# Patient Record
Sex: Male | Born: 1980 | Race: White | Hispanic: No | Marital: Single | Smoking: Current every day smoker
Health system: Southern US, Community
[De-identification: ages and names within clinical notes are randomized; demographics above are authoritative.]

## PROBLEM LIST (undated history)

## (undated) DIAGNOSIS — F1910 Other psychoactive substance abuse, uncomplicated: Secondary | ICD-10-CM

## (undated) DIAGNOSIS — J449 Chronic obstructive pulmonary disease, unspecified: Secondary | ICD-10-CM

## (undated) DIAGNOSIS — F419 Anxiety disorder, unspecified: Secondary | ICD-10-CM

## (undated) DIAGNOSIS — F32A Depression, unspecified: Secondary | ICD-10-CM

## (undated) DIAGNOSIS — F319 Bipolar disorder, unspecified: Secondary | ICD-10-CM

## (undated) DIAGNOSIS — N200 Calculus of kidney: Secondary | ICD-10-CM

## (undated) DIAGNOSIS — E8801 Alpha-1-antitrypsin deficiency: Secondary | ICD-10-CM

## (undated) DIAGNOSIS — K5090 Crohn's disease, unspecified, without complications: Secondary | ICD-10-CM

## (undated) DIAGNOSIS — F329 Major depressive disorder, single episode, unspecified: Secondary | ICD-10-CM

## (undated) HISTORY — PX: INGUINAL HERNIA REPAIR: SUR1180

## (undated) HISTORY — PX: TONSILLECTOMY: SUR1361

---

## 1999-04-19 ENCOUNTER — Emergency Department (HOSPITAL_COMMUNITY): Admission: EM | Admit: 1999-04-19 | Discharge: 1999-04-19 | Payer: Self-pay

## 1999-04-20 ENCOUNTER — Emergency Department (HOSPITAL_COMMUNITY): Admission: EM | Admit: 1999-04-20 | Discharge: 1999-04-20 | Payer: Self-pay | Admitting: Emergency Medicine

## 1999-12-29 ENCOUNTER — Encounter: Payer: Self-pay | Admitting: Emergency Medicine

## 1999-12-29 ENCOUNTER — Emergency Department (HOSPITAL_COMMUNITY): Admission: EM | Admit: 1999-12-29 | Discharge: 1999-12-29 | Payer: Self-pay | Admitting: Emergency Medicine

## 2000-02-14 ENCOUNTER — Encounter: Payer: Self-pay | Admitting: Orthopedic Surgery

## 2000-02-14 ENCOUNTER — Ambulatory Visit (HOSPITAL_COMMUNITY): Admission: RE | Admit: 2000-02-14 | Discharge: 2000-02-14 | Payer: Self-pay | Admitting: Orthopedic Surgery

## 2001-06-12 ENCOUNTER — Emergency Department (HOSPITAL_COMMUNITY): Admission: EM | Admit: 2001-06-12 | Discharge: 2001-06-13 | Payer: Self-pay | Admitting: Emergency Medicine

## 2001-11-19 ENCOUNTER — Emergency Department (HOSPITAL_COMMUNITY): Admission: EM | Admit: 2001-11-19 | Discharge: 2001-11-20 | Payer: Self-pay | Admitting: Emergency Medicine

## 2001-11-20 ENCOUNTER — Encounter: Payer: Self-pay | Admitting: Emergency Medicine

## 2002-02-25 ENCOUNTER — Encounter: Payer: Self-pay | Admitting: Internal Medicine

## 2002-02-25 ENCOUNTER — Ambulatory Visit (HOSPITAL_COMMUNITY): Admission: RE | Admit: 2002-02-25 | Discharge: 2002-02-25 | Payer: Self-pay | Admitting: Internal Medicine

## 2002-03-14 ENCOUNTER — Encounter: Payer: Self-pay | Admitting: Internal Medicine

## 2002-03-14 ENCOUNTER — Encounter (INDEPENDENT_AMBULATORY_CARE_PROVIDER_SITE_OTHER): Payer: Self-pay | Admitting: *Deleted

## 2002-03-14 ENCOUNTER — Ambulatory Visit (HOSPITAL_COMMUNITY): Admission: RE | Admit: 2002-03-14 | Discharge: 2002-03-14 | Payer: Self-pay | Admitting: Internal Medicine

## 2002-11-11 ENCOUNTER — Emergency Department (HOSPITAL_COMMUNITY): Admission: EM | Admit: 2002-11-11 | Discharge: 2002-11-11 | Payer: Self-pay | Admitting: Emergency Medicine

## 2002-11-15 ENCOUNTER — Emergency Department (HOSPITAL_COMMUNITY): Admission: EM | Admit: 2002-11-15 | Discharge: 2002-11-15 | Payer: Self-pay | Admitting: Emergency Medicine

## 2002-12-27 ENCOUNTER — Emergency Department (HOSPITAL_COMMUNITY): Admission: AD | Admit: 2002-12-27 | Discharge: 2002-12-27 | Payer: Self-pay | Admitting: Emergency Medicine

## 2002-12-27 ENCOUNTER — Encounter: Payer: Self-pay | Admitting: Emergency Medicine

## 2003-01-06 ENCOUNTER — Emergency Department (HOSPITAL_COMMUNITY): Admission: EM | Admit: 2003-01-06 | Discharge: 2003-01-06 | Payer: Self-pay | Admitting: Emergency Medicine

## 2003-02-17 ENCOUNTER — Inpatient Hospital Stay (HOSPITAL_COMMUNITY): Admission: EM | Admit: 2003-02-17 | Discharge: 2003-02-24 | Payer: Self-pay | Admitting: Emergency Medicine

## 2003-02-17 ENCOUNTER — Encounter: Payer: Self-pay | Admitting: Emergency Medicine

## 2003-02-18 ENCOUNTER — Encounter: Payer: Self-pay | Admitting: Internal Medicine

## 2003-02-18 ENCOUNTER — Encounter (INDEPENDENT_AMBULATORY_CARE_PROVIDER_SITE_OTHER): Payer: Self-pay | Admitting: *Deleted

## 2003-02-23 ENCOUNTER — Encounter: Payer: Self-pay | Admitting: Internal Medicine

## 2003-02-23 ENCOUNTER — Encounter (INDEPENDENT_AMBULATORY_CARE_PROVIDER_SITE_OTHER): Payer: Self-pay | Admitting: *Deleted

## 2003-03-11 ENCOUNTER — Emergency Department (HOSPITAL_COMMUNITY): Admission: EM | Admit: 2003-03-11 | Discharge: 2003-03-12 | Payer: Self-pay | Admitting: Emergency Medicine

## 2003-03-11 ENCOUNTER — Encounter: Payer: Self-pay | Admitting: Emergency Medicine

## 2003-03-13 ENCOUNTER — Emergency Department (HOSPITAL_COMMUNITY): Admission: AD | Admit: 2003-03-13 | Discharge: 2003-03-14 | Payer: Self-pay | Admitting: Emergency Medicine

## 2003-03-14 ENCOUNTER — Encounter: Payer: Self-pay | Admitting: Emergency Medicine

## 2003-05-04 ENCOUNTER — Emergency Department (HOSPITAL_COMMUNITY): Admission: EM | Admit: 2003-05-04 | Discharge: 2003-05-05 | Payer: Self-pay | Admitting: Emergency Medicine

## 2003-05-07 ENCOUNTER — Inpatient Hospital Stay (HOSPITAL_COMMUNITY): Admission: AD | Admit: 2003-05-07 | Discharge: 2003-05-10 | Payer: Self-pay | Admitting: Family Medicine

## 2003-05-07 ENCOUNTER — Encounter: Payer: Self-pay | Admitting: Emergency Medicine

## 2003-07-03 ENCOUNTER — Emergency Department (HOSPITAL_COMMUNITY): Admission: EM | Admit: 2003-07-03 | Discharge: 2003-07-03 | Payer: Self-pay | Admitting: *Deleted

## 2003-07-10 ENCOUNTER — Encounter: Admission: RE | Admit: 2003-07-10 | Discharge: 2003-07-10 | Payer: Self-pay | Admitting: Internal Medicine

## 2003-07-31 ENCOUNTER — Encounter: Admission: RE | Admit: 2003-07-31 | Discharge: 2003-07-31 | Payer: Self-pay | Admitting: Internal Medicine

## 2003-10-17 ENCOUNTER — Emergency Department (HOSPITAL_COMMUNITY): Admission: EM | Admit: 2003-10-17 | Discharge: 2003-10-17 | Payer: Self-pay | Admitting: Emergency Medicine

## 2003-10-26 ENCOUNTER — Emergency Department (HOSPITAL_COMMUNITY): Admission: EM | Admit: 2003-10-26 | Discharge: 2003-10-26 | Payer: Self-pay

## 2004-01-17 ENCOUNTER — Emergency Department (HOSPITAL_COMMUNITY): Admission: EM | Admit: 2004-01-17 | Discharge: 2004-01-17 | Payer: Self-pay | Admitting: Emergency Medicine

## 2004-01-18 ENCOUNTER — Emergency Department (HOSPITAL_COMMUNITY): Admission: EM | Admit: 2004-01-18 | Discharge: 2004-01-18 | Payer: Self-pay | Admitting: Emergency Medicine

## 2004-01-30 ENCOUNTER — Emergency Department (HOSPITAL_COMMUNITY): Admission: EM | Admit: 2004-01-30 | Discharge: 2004-01-30 | Payer: Self-pay | Admitting: Emergency Medicine

## 2004-02-03 ENCOUNTER — Emergency Department (HOSPITAL_COMMUNITY): Admission: EM | Admit: 2004-02-03 | Discharge: 2004-02-03 | Payer: Self-pay | Admitting: Emergency Medicine

## 2004-02-10 ENCOUNTER — Emergency Department (HOSPITAL_COMMUNITY): Admission: EM | Admit: 2004-02-10 | Discharge: 2004-02-10 | Payer: Self-pay | Admitting: Internal Medicine

## 2004-02-15 ENCOUNTER — Emergency Department (HOSPITAL_COMMUNITY): Admission: EM | Admit: 2004-02-15 | Discharge: 2004-02-15 | Payer: Self-pay | Admitting: Emergency Medicine

## 2004-02-21 ENCOUNTER — Emergency Department (HOSPITAL_COMMUNITY): Admission: EM | Admit: 2004-02-21 | Discharge: 2004-02-22 | Payer: Self-pay | Admitting: Emergency Medicine

## 2004-02-22 ENCOUNTER — Emergency Department (HOSPITAL_COMMUNITY): Admission: EM | Admit: 2004-02-22 | Discharge: 2004-02-22 | Payer: Self-pay | Admitting: Emergency Medicine

## 2004-02-25 ENCOUNTER — Encounter: Admission: RE | Admit: 2004-02-25 | Discharge: 2004-02-25 | Payer: Self-pay | Admitting: Internal Medicine

## 2004-03-30 ENCOUNTER — Emergency Department (HOSPITAL_COMMUNITY): Admission: EM | Admit: 2004-03-30 | Discharge: 2004-03-31 | Payer: Self-pay | Admitting: Emergency Medicine

## 2004-04-02 ENCOUNTER — Inpatient Hospital Stay (HOSPITAL_COMMUNITY): Admission: RE | Admit: 2004-04-02 | Discharge: 2004-04-06 | Payer: Self-pay | Admitting: Internal Medicine

## 2004-04-06 ENCOUNTER — Encounter (INDEPENDENT_AMBULATORY_CARE_PROVIDER_SITE_OTHER): Payer: Self-pay | Admitting: *Deleted

## 2004-05-26 ENCOUNTER — Ambulatory Visit: Payer: Self-pay | Admitting: Internal Medicine

## 2004-07-03 ENCOUNTER — Emergency Department (HOSPITAL_COMMUNITY): Admission: EM | Admit: 2004-07-03 | Discharge: 2004-07-03 | Payer: Self-pay

## 2004-07-03 ENCOUNTER — Encounter (INDEPENDENT_AMBULATORY_CARE_PROVIDER_SITE_OTHER): Payer: Self-pay | Admitting: *Deleted

## 2004-09-14 ENCOUNTER — Emergency Department (HOSPITAL_COMMUNITY): Admission: EM | Admit: 2004-09-14 | Discharge: 2004-09-14 | Payer: Self-pay | Admitting: Emergency Medicine

## 2005-01-02 ENCOUNTER — Emergency Department (HOSPITAL_COMMUNITY): Admission: EM | Admit: 2005-01-02 | Discharge: 2005-01-03 | Payer: Self-pay | Admitting: Emergency Medicine

## 2005-01-15 ENCOUNTER — Emergency Department (HOSPITAL_COMMUNITY): Admission: EM | Admit: 2005-01-15 | Discharge: 2005-01-15 | Payer: Self-pay | Admitting: Emergency Medicine

## 2005-01-21 ENCOUNTER — Emergency Department (HOSPITAL_COMMUNITY): Admission: EM | Admit: 2005-01-21 | Discharge: 2005-01-21 | Payer: Self-pay | Admitting: Emergency Medicine

## 2005-04-14 ENCOUNTER — Emergency Department (HOSPITAL_COMMUNITY): Admission: EM | Admit: 2005-04-14 | Discharge: 2005-04-14 | Payer: Self-pay | Admitting: Emergency Medicine

## 2005-09-24 ENCOUNTER — Emergency Department (HOSPITAL_COMMUNITY): Admission: EM | Admit: 2005-09-24 | Discharge: 2005-09-24 | Payer: Self-pay | Admitting: Emergency Medicine

## 2005-10-02 ENCOUNTER — Encounter (INDEPENDENT_AMBULATORY_CARE_PROVIDER_SITE_OTHER): Payer: Self-pay | Admitting: *Deleted

## 2005-10-02 ENCOUNTER — Emergency Department (HOSPITAL_COMMUNITY): Admission: EM | Admit: 2005-10-02 | Discharge: 2005-10-02 | Payer: Self-pay | Admitting: *Deleted

## 2005-11-21 ENCOUNTER — Emergency Department (HOSPITAL_COMMUNITY): Admission: EM | Admit: 2005-11-21 | Discharge: 2005-11-21 | Payer: Self-pay | Admitting: Emergency Medicine

## 2006-01-05 ENCOUNTER — Inpatient Hospital Stay (HOSPITAL_COMMUNITY): Admission: EM | Admit: 2006-01-05 | Discharge: 2006-01-18 | Payer: Self-pay | Admitting: Emergency Medicine

## 2006-01-05 ENCOUNTER — Encounter (INDEPENDENT_AMBULATORY_CARE_PROVIDER_SITE_OTHER): Payer: Self-pay | Admitting: *Deleted

## 2006-01-06 ENCOUNTER — Encounter (INDEPENDENT_AMBULATORY_CARE_PROVIDER_SITE_OTHER): Payer: Self-pay | Admitting: *Deleted

## 2006-01-08 ENCOUNTER — Encounter (INDEPENDENT_AMBULATORY_CARE_PROVIDER_SITE_OTHER): Payer: Self-pay | Admitting: *Deleted

## 2006-01-09 ENCOUNTER — Ambulatory Visit: Payer: Self-pay | Admitting: Gastroenterology

## 2006-01-12 ENCOUNTER — Encounter (INDEPENDENT_AMBULATORY_CARE_PROVIDER_SITE_OTHER): Payer: Self-pay | Admitting: *Deleted

## 2006-01-12 ENCOUNTER — Encounter (INDEPENDENT_AMBULATORY_CARE_PROVIDER_SITE_OTHER): Payer: Self-pay | Admitting: Specialist

## 2006-01-17 HISTORY — PX: COLON SURGERY: SHX602

## 2006-01-18 ENCOUNTER — Encounter (INDEPENDENT_AMBULATORY_CARE_PROVIDER_SITE_OTHER): Payer: Self-pay | Admitting: *Deleted

## 2006-02-16 ENCOUNTER — Ambulatory Visit: Payer: Self-pay | Admitting: Internal Medicine

## 2008-07-19 ENCOUNTER — Emergency Department (HOSPITAL_COMMUNITY): Admission: EM | Admit: 2008-07-19 | Discharge: 2008-07-20 | Payer: Self-pay | Admitting: Family Medicine

## 2008-07-19 ENCOUNTER — Encounter (INDEPENDENT_AMBULATORY_CARE_PROVIDER_SITE_OTHER): Payer: Self-pay | Admitting: *Deleted

## 2009-06-29 ENCOUNTER — Encounter (INDEPENDENT_AMBULATORY_CARE_PROVIDER_SITE_OTHER): Payer: Self-pay | Admitting: *Deleted

## 2009-06-29 ENCOUNTER — Emergency Department (HOSPITAL_COMMUNITY): Admission: EM | Admit: 2009-06-29 | Discharge: 2009-06-29 | Payer: Self-pay | Admitting: Emergency Medicine

## 2009-07-08 ENCOUNTER — Emergency Department (HOSPITAL_COMMUNITY): Admission: EM | Admit: 2009-07-08 | Discharge: 2009-07-08 | Payer: Self-pay | Admitting: Emergency Medicine

## 2009-07-15 ENCOUNTER — Emergency Department (HOSPITAL_COMMUNITY): Admission: EM | Admit: 2009-07-15 | Discharge: 2009-07-15 | Payer: Self-pay | Admitting: Emergency Medicine

## 2009-07-22 ENCOUNTER — Ambulatory Visit (HOSPITAL_COMMUNITY): Admission: RE | Admit: 2009-07-22 | Discharge: 2009-07-22 | Payer: Self-pay | Admitting: Urology

## 2009-07-25 ENCOUNTER — Emergency Department (HOSPITAL_COMMUNITY): Admission: EM | Admit: 2009-07-25 | Discharge: 2009-07-26 | Payer: Self-pay | Admitting: Emergency Medicine

## 2009-07-26 ENCOUNTER — Inpatient Hospital Stay (HOSPITAL_COMMUNITY): Admission: AD | Admit: 2009-07-26 | Discharge: 2009-07-31 | Payer: Self-pay | Admitting: Psychiatry

## 2009-07-26 ENCOUNTER — Ambulatory Visit: Payer: Self-pay | Admitting: Psychiatry

## 2009-07-27 ENCOUNTER — Encounter (INDEPENDENT_AMBULATORY_CARE_PROVIDER_SITE_OTHER): Payer: Self-pay | Admitting: *Deleted

## 2009-08-11 ENCOUNTER — Emergency Department (HOSPITAL_COMMUNITY): Admission: EM | Admit: 2009-08-11 | Discharge: 2009-08-11 | Payer: Self-pay | Admitting: Emergency Medicine

## 2009-08-11 ENCOUNTER — Telehealth: Payer: Self-pay | Admitting: Internal Medicine

## 2009-08-11 ENCOUNTER — Ambulatory Visit (HOSPITAL_COMMUNITY): Admission: RE | Admit: 2009-08-11 | Discharge: 2009-08-11 | Payer: Self-pay | Admitting: Urology

## 2009-08-11 ENCOUNTER — Encounter (INDEPENDENT_AMBULATORY_CARE_PROVIDER_SITE_OTHER): Payer: Self-pay | Admitting: *Deleted

## 2009-08-16 ENCOUNTER — Encounter (INDEPENDENT_AMBULATORY_CARE_PROVIDER_SITE_OTHER): Payer: Self-pay | Admitting: *Deleted

## 2009-09-17 ENCOUNTER — Encounter (INDEPENDENT_AMBULATORY_CARE_PROVIDER_SITE_OTHER): Payer: Self-pay | Admitting: *Deleted

## 2009-09-17 ENCOUNTER — Emergency Department (HOSPITAL_COMMUNITY): Admission: EM | Admit: 2009-09-17 | Discharge: 2009-09-17 | Payer: Self-pay | Admitting: Emergency Medicine

## 2009-09-20 DIAGNOSIS — K5090 Crohn's disease, unspecified, without complications: Secondary | ICD-10-CM

## 2009-09-20 DIAGNOSIS — Z8719 Personal history of other diseases of the digestive system: Secondary | ICD-10-CM | POA: Insufficient documentation

## 2009-09-20 DIAGNOSIS — F909 Attention-deficit hyperactivity disorder, unspecified type: Secondary | ICD-10-CM | POA: Insufficient documentation

## 2009-09-20 DIAGNOSIS — Z9189 Other specified personal risk factors, not elsewhere classified: Secondary | ICD-10-CM | POA: Insufficient documentation

## 2009-09-20 DIAGNOSIS — D649 Anemia, unspecified: Secondary | ICD-10-CM

## 2009-09-20 DIAGNOSIS — F319 Bipolar disorder, unspecified: Secondary | ICD-10-CM

## 2009-10-21 ENCOUNTER — Emergency Department (HOSPITAL_COMMUNITY): Admission: EM | Admit: 2009-10-21 | Discharge: 2009-10-21 | Payer: Self-pay | Admitting: Emergency Medicine

## 2009-11-27 ENCOUNTER — Emergency Department (HOSPITAL_COMMUNITY): Admission: EM | Admit: 2009-11-27 | Discharge: 2009-11-27 | Payer: Self-pay | Admitting: Emergency Medicine

## 2010-04-11 ENCOUNTER — Emergency Department (HOSPITAL_COMMUNITY): Admission: EM | Admit: 2010-04-11 | Discharge: 2010-04-12 | Payer: Self-pay | Admitting: Emergency Medicine

## 2010-08-10 IMAGING — CR DG ABDOMEN 1V
1 series · 1 of 1 positions shown · non-contrast
Comparison: 07/15/2009.

CLINICAL DATA: Pre lithotripsy for left UPJ stone.

ABDOMEN - 1 VIEW

[t abdomen supine]
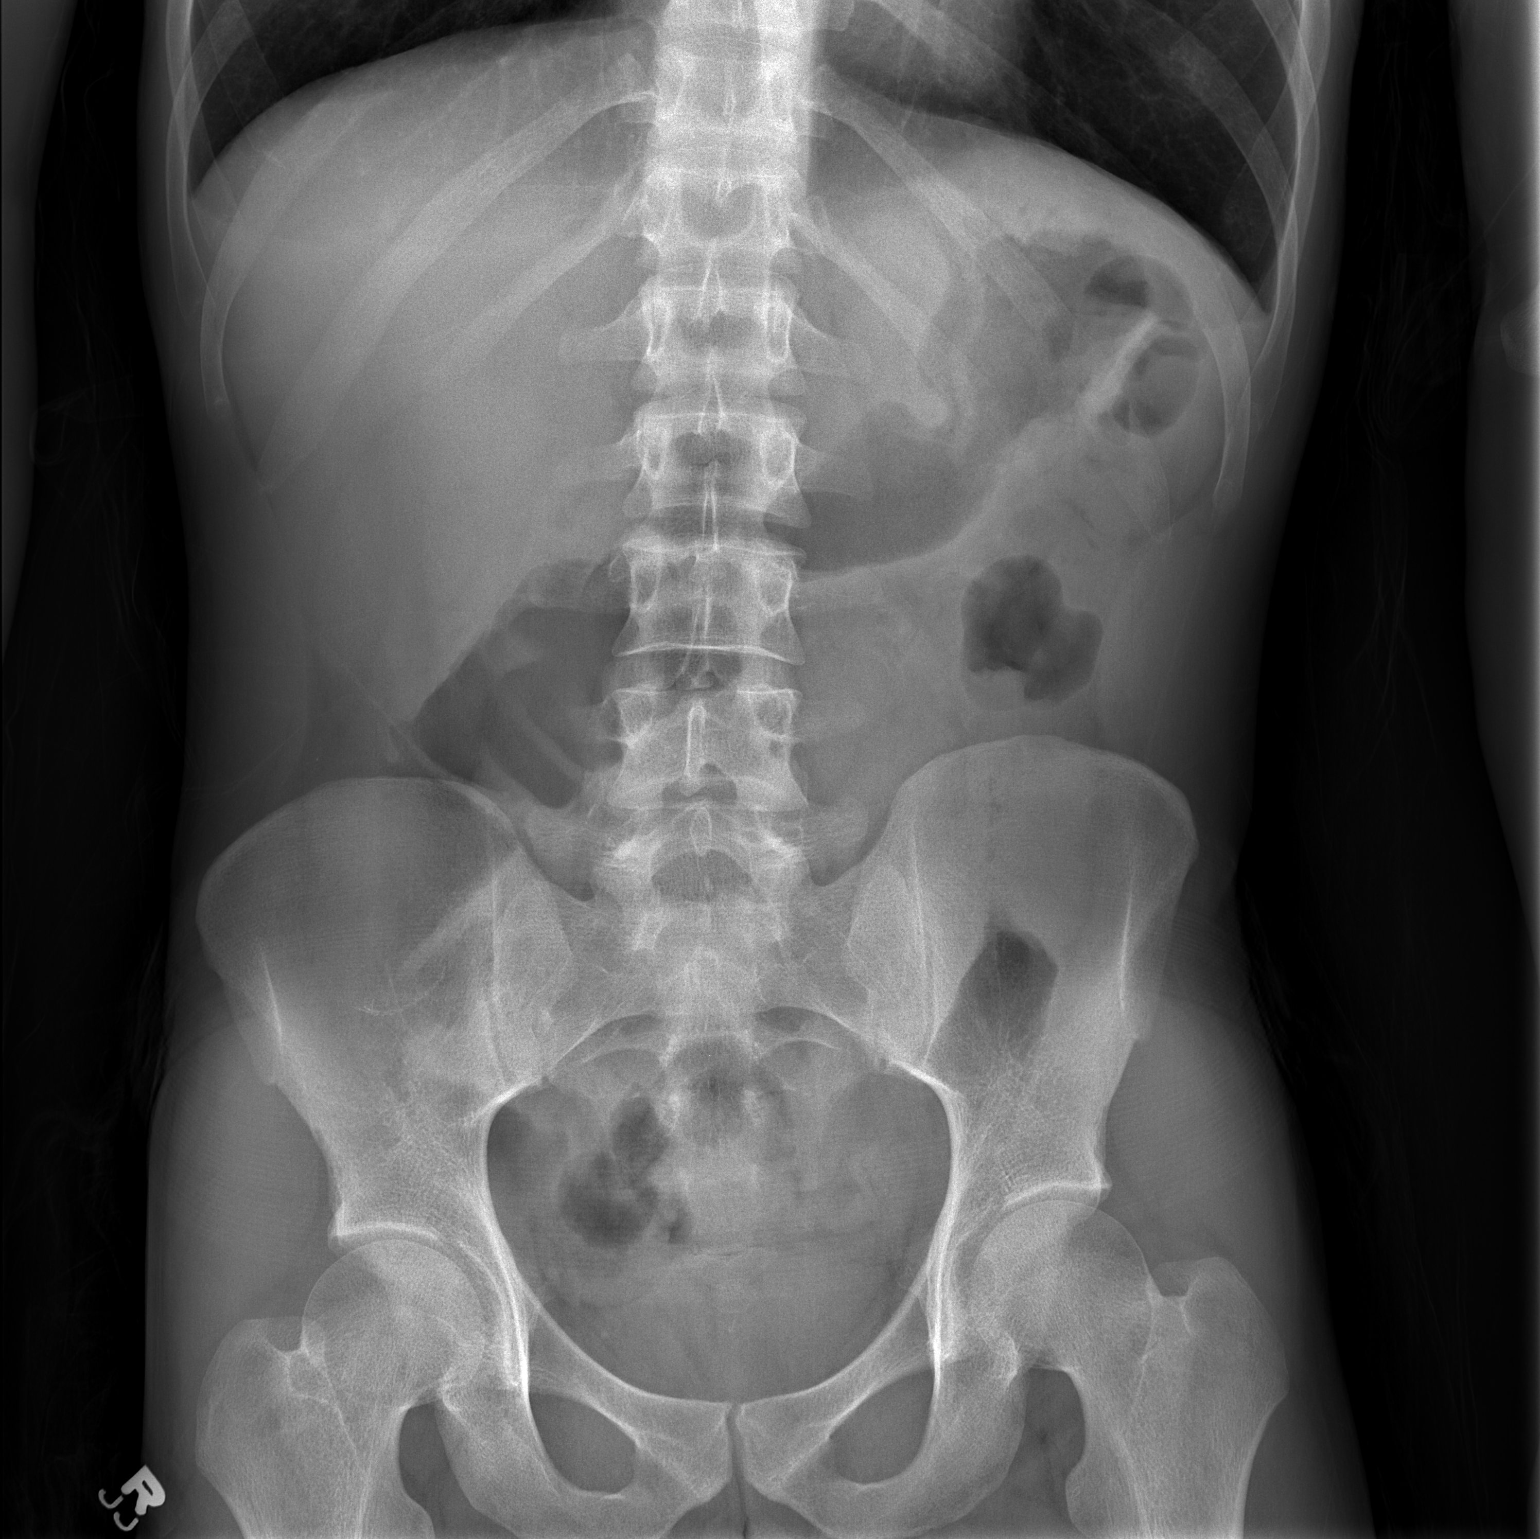

[1 of 1 positions shown; findings below may reference images not displayed]

FINDINGS: No definite ureteral stone is visible.  There is a
possible subtle density projecting over the tips of the left L3
transverse process which could be a stone.  No other acute or
significant findings.
IMPRESSION: No definite left ureteral calculus is visualized, but there is
suspicion of a subtle density projecting over the left L3
transverse process tip.  See report.

## 2010-08-23 NOTE — Discharge Summary (Signed)
Summary: Behavioral Health  NAME:  [name], SWART NO.:  [id number]      MEDICAL RECORD NO.:  [id number]          PATIENT TYPE:  IPS      LOCATION:  0503                          FACILITY:  [facility]      PHYSICIAN:  Carlton Adam, M.D.      DATE OF BIRTH:  March 05, 1981      DATE OF ADMISSION:  07/26/2009   DATE OF DISCHARGE:  07/31/2009                                  DISCHARGE SUMMARY      CHIEF COMPLAINT/PRESENT ILLNESS:  This was the first admission to [facility] for this 30 year old male involuntarily   committed.  Presented by way of the emergency room after he had made   several deep cuts to his inner left forearm with a kitchen knife, made   about 5 lacerations.  They were actively bleeding.  He said that he had   cut before in order to cope and this time he could not stop the   bleeding.  He was very angry, had been in an altercation with his   mother.  Endorsed depressed mood.  Has endorsed that it was an   intentional attempt to hurt himself.  Trying to make a point to his   mother that her drug abuse is hurting him.  Was previously on   psychiatric medication for ADHD and mood problems.  Has been off them   now for 3 or 4 years.  Has been using cocaine since age 42 and he is   using 2 or 3 times per week.      PAST PSYCHIATRIC HISTORY:  Previously followed by Dr. Reece Levy about 3-4   years ago.  He had been off his medications since that time.  Previously   taking Seroquel 300 mg twice a day, Adderall 10 mg twice a day.  He   turned to street drugs.  He claimed when he was no longer taking   medications.      ALCOHOL AND DRUG HISTORY:  He uses cocaine, marijuana and at one time   used heroin.  Has been treated for substance abuse in the [facility].  Has had   3-1/2 years of abstinence following treatment.      MEDICAL HISTORY:   1. Laceration to his left arm.   2. Crohn disease.   3. Kidney stones.   4. Migraine headaches.      MEDICATIONS:   None prescribed.      PHYSICAL EXAMINATION:  Compatible with the already described   lacerations.      Blood chemistry:  Sodium 140, potassium 3.4, BUN 4, creatinine 0.9,   glucose 85.  White blood cells 10.6, hemoglobin 12.7.  UDS positive for   cocaine, benzodiazepines and marijuana.      MENTAL STATUS EXAM:  Reveals a fully alert cooperative male in full   contact with reality.  Mood depressed.  Affect constricted.  Thought   processes logical, coherent and relevant.  Endorsed no active suicidal   or homicidal ideas.  No delusions.  No hallucinations.  Endorsed he   wanted help but wanting to be discharged from the hospital.      ADMISSION DIAGNOSES:  Axis I:   1. Mood disorder, not otherwise specified.   2. Impulse control, not otherwise specified.   3. Cocaine, benzodiazepines, marijuana abuse.   Axis II:  No diagnosis.   Axis III:   1. Laceration of left forearm.   2. Crohn disease.   3. Kidney stones.   4. Migraine headaches.   Axis IV:  Moderate.   Axis V:  Upon admission 35-40, highest Global Assessment of Functioning   in the last year 94.      COURSE IN THE HOSPITAL:  He was admitted.  He was started in individual   and group psychotherapy.  He was given some Ambien for sleep.  He was   given some Percocet for the pain and he was given Ativan as needed.  He   was then placed on Seroquel as well as Depakote.  Endorsed he got in an   argument with the mother after Christmas.  Had kept everything bottled   up, exploded, cut himself.  He was using Seroquel 300 twice a day,   Adderall 10 mg twice a day for ADHD.  Had been on off this medication   for a year.  Without the medications, he turned to street drugs, mainly   cocaine, marijuana and once heroin.  ADS 3-1/2 years ago.  Moved with   the mother in September.  Living off of food stamps and mother's   disability.  Had been applying for jobs with no luck.  Wanted to go to   [place].  Has a 34-year-old daughter.   __________ .  He was irritable,   angry.  Focused on medications.  Worries he cannot __________  for work.   Wanted help with the ADHD and the mood disorder.  Has hard time staying   focused.  Easily agitated, easily frustrated, so we continued to   optimize treatment with the medications.  He decided to return to   mother's home upon discharge.  Counselor tried to contact the mother but   she was not successful.  On January 6, he endorsed this was the best he   has felt in months.  Wanted to pursue medication further.  Endorsed he   felt he was going to be ready to be discharged soon.  Endorsed he will   be able to deal with his mother while waiting to go to [place].   Objectively better with mild decrease in irritability and agitation.   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  He continued to endorse that he was   better.  Worried about getting the medication when he was discharged.   We told him that we were going to make the necessary arrangements for   him to be able to follow up on an outpatient basis.  He was excited   about this, and on January 8, he was in full contact with reality.   There were no active suicidal or homicidal ideas.  No delusions.  No   hallucinations.  He requested discharge.  There was no concerns about   safety.  We went ahead and discharged to outpatient followup.      DISCHARGE DIAGNOSES:  Axis I:   1. Mood disorder, not otherwise specified.   2. Attention deficit hyperactivity disorder.   3. Impulse control, not otherwise specified.   4. Cocaine and marijuana abuse.  Axis II:  No diagnosis.   Axis III:   1. Status post lacerations to arm.   2. Kidney stones.   3. Migraine headaches.   4. Crohn.   Axis IV:  Moderate.   Axis V:  Upon discharge 50-55.      Discharged on:   1. Adderall 10 mg twice a day.   2. Depakote ER 250 three times a day.   3. Seroquel 50 three times a day and 100 at bedtime.   4. Ambien 5 at bedtime as needed for sleep.       FOLLOWUP:  Through [facility].               Carlton Adam, M.D.            IL/MEDQ  D:  08/16/2009  T:  08/16/2009  Job:  [id number]

## 2010-08-23 NOTE — Discharge Summary (Signed)
Summary: Crohn's Flare   NAME:  [name], [name]                        ACCOUNT NO.:  [id number]   MEDICAL RECORD NO.:  [id number]                   PATIENT TYPE:  INP   LOCATION:  0448                                 FACILITY:  [facility]   PHYSICIAN:  Sandy Salaam. Deatra Ina, M.D. [facility]          DATE OF BIRTH:  Dec 12, 1980   DATE OF ADMISSION:  04/02/2004  DATE OF DISCHARGE:  04/06/2004                                 DISCHARGE SUMMARY   ADMISSION DIAGNOSES:  70.  A 30 year old male with Crohn's ileitis with flare with nausea, vomiting      and abdominal pain concerning for low grade obstruction.  2.  Bipolar disorder.  3.  Smoker.  4.  Previous history of drug abuse.  5.  Mild anemia.   DISCHARGE DIAGNOSES:  1.  Crohn's ileitis with exacerbation symptomatically improved.  2.  Bipolar disorder.  3.  Smoker.  4.  Previous history of drug abuse.  5.  Mild anemia.   CONSULTATIONS:  None.   PROCEDURE:  Plain abdominal films, patient had CT scan of the abdomen and  pelvis within one week prior to admission.   BRIEF HISTORY:  [name] is a 30 year old white male with history of Crohn's  disease seen in the past by Dr. Delfin Edis in October of 2004. He was  initially treated with Pentasa and had been taking up to three tablets four  times daily.  He says that he has had a couple of flares since that time  with abdominal pain and nausea which he self treated but over the past six  days had had increasing difficulty with nausea and vomiting and abdominal  pain. He said he had been unable to keep down anything and had had a visit  to the emergency room on September 7 at which time he had CT scan of the  abdomen and pelvis that showed thickening of the terminal ileum and  ulceration but no discreet obstruction.  He had some mild distention of  small bowel loops and some adenopathy in the mesentery.  The patient called  on the night of admission stating that he had been sick for six days and was  unable to keep anything down and with that CT finding was admitted to the  hospital for Crohn's exacerbation for medical management.   LABORATORY DATA:  On admission September 11, WBC of 9.2, hemoglobin 12.2,  hematocrit of 35.9, MCV 88, platelets 332.  Followup on September 13, WBC of  13.3, hemoglobin 11.1, hematocrit of 33.6, electrolytes within normal  limits, glucose was 160 on admission. BUN 6, creatinine 0.8, albumin 2.5,  liver function studies normal.  Drug screen was positive for marijuana,  metabolites, benzo and propoxyphene.  UA was negative.   Plain abdominal films on admission no evidence for free air, no evidence for  bowel obstruction.   HOSPITAL COURSE:  The patient was admitted to the service of Dr. Glendell Docker  Carlean Purl. He was n.p.o. with sips and chips, started on IV fluids, Demerol  and Phenergan as needed for control of pain and nausea and placed on high  dose IV Solu-Medrol at 40 q. 12.  He was continued on his home medications  of Seroquel and trazodone.  His vomiting was under control fairly quickly,  he continued somewhat nauseated over the next couple of days bloated and  uncomfortable and remained on clear liquids. By the 13th, he was feeling  significantly better and was sneaking food from [employer] and stated  that he was hungry and ready to try solid food.  His pain was decreased at  that point, he had had no further nausea or vomiting. We did advance his  diet to low residue, he seemed to tolerate this without difficulty,  converted his steroids to p.o. as well as his pain medication and on  September 14, he remained stable, tolerated a diet without difficulty and  was allowed discharged to home with instructions to followup with Dr. Delfin Edis on September 28 at 11 a.m.  He was to call for any problems in the  interim. He was to stay on a low residue diet and Pentasa 250 mg 4 tablets  p.o. q.i.d., prednisone 40 mg p.o. q.d. and Vicodin 1 p.o. q. 4-6  hour  p.r.n. pain.   CONDITION ON DISCHARGE:  Stable.   The patient was given a work absence note at the time of discharge to cover  him for the time of his hospitalization and through September 18.      AE/MEDQ  D:  04/08/2004  T:  04/09/2004  Job:  [id number]

## 2010-08-23 NOTE — Consult Note (Signed)
Summary: Bowel Obstruction   NAME:  [name], [name]              ACCOUNT NO.:  [id number]   MEDICAL RECORD NO.:  [id number]          PATIENT TYPE:  INP   LOCATION:  1319                         FACILITY:  [facility]   PHYSICIAN:  Georgina Quint, M.D.   DATE OF BIRTH:  1980/10/03   DATE OF CONSULTATION:  01/08/2006  DATE OF DISCHARGE:                                   CONSULTATION   CHIEF COMPLAINT:  Bowel obstruction.   PRESENT ILLNESS:  [name] is a 30 year old man who has had the  diagnosis of Crohn's disease since 2004.  He has had multiple flares of the  disease treated by steroids.  He has been on Pentasa 1000 mg four times a  day.  Three days ago, he was admitted to the hospital with vomiting and  abdominal pain and distension.  CT scan shows active Crohn's disease and  possible stricture of the terminal ileum with dilatation of proximal small  bowel.  The patient has persisted with picture of partial small bowel  obstruction while in the hospital although he stopped vomiting.  CT scan  contrast has passed into the colon.  Because of the chronicity of this  problem, the patient's intermittent failure to comply with medical regimen  and his young age with likelihood of severe problems in the future, I have  been asked to see him for consideration of resection.   PAST MEDICAL HISTORY:  He is generally healthy.  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  He has not had any surgery on the left groin  but had a right inguinal hernia repair a long time ago.  His diagnosis of  bipolar disorder and for that is on Seroquel 150 mg twice a day.   He says he is allergic to PENICILLIN and has a bad reaction to BENADRYL.   He smokes cigarettes.  Drinks alcoholic beverages in moderation.  He has a  history of cocaine use but denies it at this time.   The remainder of the medical history and review  of systems is entirely  negative.   PHYSICAL EXAMINATION:  GENERAL APPEARANCE:  The patient is thin but  generally healthy appearing young man.  VITAL SIGNS:  Unremarkable as recorded by nursing staff.  No acute distress.  HEENT:  The head and neck exam is unremarkable with except for carious  teeth.  No neck masses.  No thyroid enlargement.  CHEST:  Clear to auscultation.  HEART:  Regular and rhythm normal.  No murmur or gallop.  ABDOMEN:  Moderately distended and tympanitic, slightly tender, increased  bowel sounds.  A tiny umbilical defect is noted with no evidence of  herniation of viscera into it.  There is a reducible left inguinal hernia.  No masses are detectable.  EXTREMITIES:  No lesions are noted.  Good pulses, no edema.  NEUROLOGICAL:  Grossly normal.  SKIN:  Multiple tattoos, otherwise no lesions.  LYMPH NODES:  None enlarged in the groin, axilla  or neck.   IMPRESSION:  1.  Crohn's disease with stricture of the terminal ileum productive of      partial small-bowel obstruction.  2.  Symptomatic left inguinal hernia.  3.  Bipolar disorder.   PLAN:  I recommend elective but semi-urgent exploration and resection of the  diseased segment of bowel.  I believe it would probably be safe to perform  the hernia repair, same anesthetic, through a separate incision.  The  patient is very interested in having this done, but needs to take care of  some personal issues before he will let me post the operation.  I will see  him again in the morning in follow-up.      Georgina Quint, M.D.  Electronically Signed     WB/MEDQ  D:  01/08/2006  T:  01/09/2006  Job:  [id number]

## 2010-08-23 NOTE — Progress Notes (Signed)
Summary: TRIAGE  Phone Note Other Incoming Call back at [phone]   Caller: Patient walked in Summary of Call: Patient walked in at 4:55pm wanting to see Dr. Olevia Perches, but I advised him she was out of the country. He is having a crohns flare and needs to have a appt soon as possible I gave him the soonest appt that Dr. Olevia Perches has which is September 24, 2009, but he would like a nurse to call him back  to be seen sooner. I also advised him that since its 5pm that a nurse will call him back tomorrow. Patient was advised when he comes for the office visit that he will have to bring $184 since he has no insurance.  Initial call taken by: Bernita Buffy CMA Deborra Medina),  August 11, 2009 5:03 PM  Follow-up for Phone Call        Message left for patient to callback-I called at 8:19am and now. Vivia Ewing LPN  August 12, 3844 9:06 AM  Left message to call back. Vivia Ewing LPN  August 12, 6597 12:25 PM   Per [facility] ER nurse, pt. left the ER last night AMA. I am still unable to reach him. I will wait for him to callback. Follow-up by: Vivia Ewing LPN,  August 12, 3568 3:13 PM  Additional Follow-up for Phone Call Additional follow up Details #1::        he will have to be seen sooner by me or PA Additional Follow-up by: Lafayette Dragon MD,  August 13, 2009 9:43 PM    Additional Follow-up for Phone Call Additional follow up Details #2::    I will work pt. in when/if he calls back, he still hasn't responded to the messages I left for him last week. Follow-up by: Vivia Ewing LPN,  August 16, 1777 8:07 AM

## 2010-08-23 NOTE — Op Note (Signed)
Summary: COLON    NAME:  [name], [name]                        ACCOUNT NO.:  [id number]   MEDICAL RECORD NO.:  [id number]                   PATIENT TYPE:  INP   LOCATION:  5727                                 FACILITY:  [facility]   PHYSICIAN:  Delfin Edis, M.D. [facility]               DATE OF BIRTH:  1981/06/27   DATE OF PROCEDURE:  DATE OF DISCHARGE:                                 OPERATIVE REPORT   PROCEDURE:  Colonoscopy.   HISTORY:  This 30 year old white male was admitted with failure to thrive,  abdominal pain, nausea and vomiting, and diarrhea with intermittent rectal  bleeding.  A CT scan of the abdomen showed a thickened terminal ileum  consistent with an inflammatory process.  He is undergoing a colonoscopy to  rule out Crohn's disease.   ENDOSCOPE:  Fujinon single-channel video endoscope.   SEDATION:  1. Versed 10 mg IV.  2. Fentanyl 100 mcg IV.   FINDINGS:  The Fujinon single-channel video endoscope was passed under  direct vision into the rectum and sigmoid colon.  The patient was monitored  by pulse oximetry.  Oxygen saturations were normal.  Anal canal and rectum  __________ was unremarkable.  The sigmoid colon was traversed without  difficulty and it showed normal appearing mucosa.  There was no evidence of  acute ulcerations and or any inflammatory changes throughout the entire  colon.  The splenic flexure, transverse colon, and hepatic flexure were  normal as well as the ascending colon and the cecum.  The ileocecal valve  was entered without difficulty and showed marked ulcerations of the terminal  ileum which was examined over the length of at least 20 cm.  There were  multiple aphthous ulcers, edema, friability, and irregularity.  Biopsies  were taken from the terminal ileum.  The endoscope was then retracted and  the colon was decompressed.  The patient tolerated the procedure well.   IMPRESSION:  1. Terminal ileitis consistent with Crohn's disease status post  multiple     biopsies.  2. Normal colon and rectum.    PLAN:  The patient will be treated for inflammatory bowel disease with  intravenous steroids.  He will possibly also require __________ and Remicade  infusion but for now we will add Pentasa and low dose Flagyl.  There is no  evidence of colon or rectal involvement.  His nutritional status has been  compromised and that problem will have to be addressed with dietary  supplements and possibly even TTN, depending on how fast he improves on  steroids.                                               Delfin Edis, M.D. [facility]    DB/MEDQ  D:  02/18/2003  T:  02/18/2003  Job:  [id number]   cc:   Thomes Lolling, M.D.  [address] [place] [address]  Fax: [phone]   FINAL DIAGNOSIS    ***MICROSCOPIC EXAMINATION AND DIAGNOSIS***    ENDOSCOPIC BIOPSY, TERMINAL ILEUM: CHRONIC ACTIVE ILEITIS   INCLUDING NECROINFLAMMATORY DEBRIS CONSISTENT WITH ULCERATION,   SEE COMMENT.    COMMENT   These findings are most consistent with inflammatory bowel   disease; however, clinical correlation is essential. No   definitive granulomas are identified. (ALL:caf 02/19/03)    cf   Date Reported: 02/19/2003 Arlene L. Golden Circle, MD   *** Electronically Signed Out By ALL ***    Clinical information   Abdominal pain; diarrhea, weight loss; Crohn' s disease (ms)    specimen(s) obtained   Ileum, biopsy, terminal    Gross Description   Received in formalin are tan, soft tissue fragments that are   submitted in toto. Number: 5   Size: 0.1 to 0.2 cm (SW:jy) 02/18/03    jy/

## 2010-08-23 NOTE — Initial Assessments (Signed)
Summary: ADMISSION NOTE  NAME:  [name], SCHERZER NO.:  [id number]      MEDICAL RECORD NO.:  [id number]          PATIENT TYPE:  IPS      LOCATION:  0503                          FACILITY:  [facility]      PHYSICIAN:  Carlton Adam, M.D.      DATE OF BIRTH:  17-Dec-1980      DATE OF ADMISSION:  07/26/2009   DATE OF DISCHARGE:                          PSYCHIATRIC ADMISSION ASSESSMENT      IDENTIFYING INFORMATION:  A 30 year old male.  This is an involuntary   admission.      HISTORY OF PRESENT ILLNESS:  First [facility] admission for this 30 year old,   who presented by way of the emergency room after he had made several   deep cuts to his inner left forearm with a kitchen knife.  He made about   5 lacerations, they were actively bleeding.  He said that he had cut   before in order to cope and this time he could not stop the bleeding.   He admitted that he was very angry and had been in an altercation with   his mother.  He endorses depressed mood and has endorsed that it was an   intentional suicide attempt.  He said he was trying to make a point to   his mother that her drug abuse is hurting him.  He was previously on   psychiatric meds for attention deficit disorder and mood problems and   has been off them now for 3-4 years.  He has been using cocaine since   age 44, and is using 2-3 times per week.      PAST PSYCHIATRIC HISTORY:  Previously followed by Dr. Reece Levy about 3-4   years ago.  He has been off medications since that time.  He was   previously taking Seroquel 300 mg b.i.d. and Adderall 10 mg b.i.d.  He   says that he turned to street drugs after he no longer was taking his   medications and he uses cocaine, marijuana and at one time used heroin.   He has been treated for substance abuse in the past at [facility] and had 3-1/2   years of abstinence following that treatment.      SOCIAL HISTORY:  A single 30 year old male.  He is unemployed,   previously had steady work at one  point for up to 7 years at a time.  He   moved in with his mother in September and is dependent on her disability   check and also receives food stamps.  Endorses that it is a stressful   situation and felt like his anger has been building up causing him to   explode.      FAMILY HISTORY:  Mother with substance abuse.      ALCOHOL AND DRUG HISTORY:  Noted above.      PRIMARY CARE PROVIDER:  Not known.      CURRENT MEDICAL PROBLEMS:  Lacerations to his left arm.      PAST MEDICAL HISTORY:   1. Crohn's disease.   2.  Kidney stone.   3. History of migraine headaches.      CURRENT MEDICATIONS:  None.      He is up-to-date on his tetanus immunization.      DRUG ALLERGIES:   1. BENADRYL.   2. PENICILLIN.      Physical exam was done in the emergency room and is noted in the record.   Hemostasis was achieved with a thrombostatic pad.  He received 60 mg of   Toradol IM in the emergency room.  Admitting vital signs; temperature   97.1, pulse 102, respirations 18, blood pressure 111/75.  Pulse oximetry   was 100%.      Chemistry; Sodium 140, potassium 3.4, chloride 108, carbon dioxide 21,   BUN 4, creatinine 0.9, random glucose 85.  CBC - WBC 10.6, hemoglobin   12.7, hematocrit 36.7 and platelets 305,000.  Alcohol level less than 5.   Urine drug screen positive for cocaine, benzodiazepines and cannabis   metabolites.      MENTAL STATUS EXAM:  Reveals a fully alert male, cooperative, in full   contact with reality, normal speech.  Gives a coherent history.  Mood   depressed.  Thought process logical.  No evidence of psychosis.  Insight   is adequate.  Endorses depression, some suicidal thoughts.  Cooperative,   asking for help.  Impulse control and judgment fair.      Axis I:  Mood disorder, not otherwise specified.  Impulse disorder, not   otherwise specified.  Cocaine abuse, rule out dependence.  Polysubstance   abuse.   Axis II:  Deferred.   Axis III:  Laceration left forearm.     Axis IV:  Severe problems with domestic conflict.   Axis V:  Global Assessment of Functioning current 40, past year not   known.      PLAN:  Involuntarily admit him to our dual diagnosis unit.  The patient   is expressing a desire to go to [place] for follow-up and not return   to his current home situation.  We are going to attempt to help him with   his domestic situation.  Meanwhile, he has agreed to restart medications   and wee have restarted him on Seroquel 50 mg p.o. b.i.d., 100 mg p.o.   nightly, Percocet as needed for the pain from his lacerations and Ativan   1 mg q.6 h., p.r.n. for any anxiety or withdrawal.  We have also   restarted his Flomax 0.4 mg, which he had taken in the past.               Joycelyn Schmid A. Scott, N.P.               Carlton Adam, M.D.   Electronically Signed         MAS/MEDQ  D:  07/28/2009  T:  07/28/2009  Job:  [id number]

## 2010-08-23 NOTE — Discharge Summary (Signed)
Summary: Crohn's Disease   NAME:  [name], [name]              ACCOUNT NO.:  [id number]   MEDICAL RECORD NO.:  [id number]          PATIENT TYPE:  INP   LOCATION:  [place]                         FACILITY:  [facility]   PHYSICIAN:  [name] Kitchen T. Hoxworth, M.D.DATE OF BIRTH:  [birth year]   DATE OF ADMISSION:  01/05/2006  DATE OF DISCHARGE:  01/18/2006                                 DISCHARGE SUMMARY   DISCHARGE DIAGNOSIS:  Crohn's disease with stricture.   SECONDARY DIAGNOSIS:  Left inguinal hernia.   SURGICAL PROCEDURES:  1. Ileocolectomy with anastomosis.  2. Repair of left inguinal hernia, surgery date January 12, 2006.   HISTORY OF PRESENT ILLNESS:  This patient is a 30 year old male with a long  history of Crohn's disease of the terminal ileum diagnosed in 2004.  He has  had a number of treatments with steroids for flares and been maintained on  Pentasa.  He has had recently increasing bouts of small bowel obstruction  and now presents with persistent high-grade small bowel obstruction with  nausea, vomiting, abdominal pain, bloating.   PAST MEDICAL HISTORY:  Crohn's disease, bipolar disorder, history of  substance abuse, anemia, remote right inguinal hernia repair.   MEDICATIONS ON ADMISSION:  Seroquel and Pentasa.   ALLERGIES:  PENICILLIN and BENADRYL.   SOCIAL HISTORY:  See detailed H&P.   FAMILY HISTORY:  See detailed H&P.   REVIEW OF SYSTEMS:  See detailed H&P.   PERTINENT PHYSICAL EXAMINATION:  GENERAL:  A pale, thin white male.  VITAL SIGNS:  He is afebrile.  Vital signs within normal limits.  ABDOMEN:  Somewhat distended.  Mildly tender, right lower quadrant.   HOSPITAL COURSE:  The patient was admitted to [facility] GI, Dr. Ardis Hughs.  He  was started on bowel rest, IV steroids and Protonix.  Also noted on exam was  a good-sized left inguinal hernia.  The patient had some improvement with  medical management but due to worsening and recurrent episodes likely  resulting in  stricture and history of noncompliance we were asked to see the  patient in consultation.  Resection was recommended.  In addition, repair of  his left inguinal hernia was recommended.  This was accepted, and he was  taken to the operating room on June 22.  Underwent ileocolectomy with  anastomosis and repair of his inguinal hernia.  Postoperatively, the patient  did reasonably well, although he did have a lot of anxiety and somewhat  difficult behavior while in the hospital.  This did improve as the days went  on.  He was started on clear liquids on the third postoperative day.  This  was able to be advanced to a regular diet.  He had some mild wound  cellulitis and was treated with IV Tygacil, and  this resolved without any drainage or abscess noted.  Final pathology  revealed inflammatory change consistent with Crohn's.  He was discharged  home on June 28.  Both wounds were healing well.  Tolerating regular diet.  The only medication is Seroquel and Vicodin for pain.  Followup is to be in  my office in  two weeks.      [name]. Hoxworth, M.D.  Electronically Signed     BTH/MEDQ  D:  03/21/2006  T:  03/22/2006  Job:  [id number]   cc:   Milus Banister, MD  [address]  [place], [place] [address]

## 2010-10-06 LAB — URINALYSIS, ROUTINE W REFLEX MICROSCOPIC
Bilirubin Urine: NEGATIVE
Glucose, UA: NEGATIVE mg/dL
Hgb urine dipstick: NEGATIVE
Nitrite: NEGATIVE
Protein, ur: NEGATIVE mg/dL
pH: 6.5 (ref 5.0–8.0)

## 2010-10-06 LAB — POCT I-STAT, CHEM 8
BUN: <3 mg/dL — ABNORMAL LOW (ref 6–23)
Creatinine, Ser: 0.7 mg/dL (ref 0.4–1.5)
Glucose, Bld: 83 mg/dL (ref 70–99)
Hemoglobin: 14.6 g/dL (ref 13.0–17.0)
Sodium: 141 mEq/L (ref 135–145)
TCO2: 25 mmol/L (ref 0–100)

## 2010-10-06 LAB — RAPID URINE DRUG SCREEN, HOSP PERFORMED
Barbiturates: NOT DETECTED
Cocaine: POSITIVE — AB
Opiates: POSITIVE — AB

## 2010-10-06 LAB — DIFFERENTIAL
Eosinophils Absolute: 0.5 10*3/uL (ref 0.0–0.7)
Eosinophils Relative: 6 % — ABNORMAL HIGH (ref 0–5)
Lymphs Abs: 2.2 10*3/uL (ref 0.7–4.0)
Neutro Abs: 5.2 10*3/uL (ref 1.7–7.7)

## 2010-10-06 LAB — CBC
HCT: 40.7 % (ref 39.0–52.0)
Hemoglobin: 14 g/dL (ref 13.0–17.0)
WBC: 8.5 10*3/uL (ref 4.0–10.5)

## 2010-10-06 LAB — POCT CARDIAC MARKERS: Myoglobin, poc: 60.7 ng/mL (ref 12–200)

## 2010-10-09 LAB — RAPID URINE DRUG SCREEN, HOSP PERFORMED
Amphetamines: NOT DETECTED
Barbiturates: NOT DETECTED
Cocaine: POSITIVE — AB
Opiates: NOT DETECTED
Tetrahydrocannabinol: POSITIVE — AB

## 2010-10-09 LAB — TSH: TSH: 3.327 u[IU]/mL (ref 0.350–4.500)

## 2010-10-09 LAB — CBC
HCT: 36.7 % — ABNORMAL LOW (ref 39.0–52.0)
MCHC: 34.6 g/dL (ref 30.0–36.0)
MCV: 97.9 fL (ref 78.0–100.0)
Platelets: 305 10*3/uL (ref 150–400)
RBC: 3.74 MIL/uL — ABNORMAL LOW (ref 4.22–5.81)
WBC: 10.6 10*3/uL — ABNORMAL HIGH (ref 4.0–10.5)

## 2010-10-09 LAB — POCT I-STAT, CHEM 8
Creatinine, Ser: 0.9 mg/dL (ref 0.4–1.5)
Glucose, Bld: 85 mg/dL (ref 70–99)
HCT: 36 % — ABNORMAL LOW (ref 39.0–52.0)
Hemoglobin: 12.2 g/dL — ABNORMAL LOW (ref 13.0–17.0)
TCO2: 21 mmol/L (ref 0–100)

## 2010-10-09 LAB — LIPID PANEL
HDL: 32 mg/dL — ABNORMAL LOW (ref >39)
Total CHOL/HDL Ratio: 3 RATIO
VLDL: 33 mg/dL (ref 0–40)

## 2010-10-09 LAB — RPR: RPR Ser Ql: NONREACTIVE

## 2010-10-09 LAB — DIFFERENTIAL
Basophils Relative: 1 % (ref 0–1)
Eosinophils Absolute: 0.5 10*3/uL (ref 0.0–0.7)
Eosinophils Relative: 5 % (ref 0–5)
Lymphs Abs: 2.6 10*3/uL (ref 0.7–4.0)
Monocytes Relative: 6 % (ref 3–12)

## 2010-10-09 LAB — HEPATIC FUNCTION PANEL
Bilirubin, Direct: 0.1 mg/dL (ref 0.0–0.3)
Indirect Bilirubin: 0.1 mg/dL — ABNORMAL LOW (ref 0.3–0.9)

## 2010-10-12 LAB — URINALYSIS, ROUTINE W REFLEX MICROSCOPIC
Bilirubin Urine: NEGATIVE
Nitrite: NEGATIVE
Specific Gravity, Urine: 1.014 (ref 1.005–1.030)
Urobilinogen, UA: 0.2 mg/dL (ref 0.0–1.0)

## 2010-10-12 LAB — POCT I-STAT, CHEM 8
Creatinine, Ser: 0.8 mg/dL (ref 0.4–1.5)
Hemoglobin: 12.9 g/dL — ABNORMAL LOW (ref 13.0–17.0)
Sodium: 140 mEq/L (ref 135–145)
TCO2: 21 mmol/L (ref 0–100)

## 2010-10-12 LAB — DIFFERENTIAL
Basophils Absolute: 0 10*3/uL (ref 0.0–0.1)
Basophils Relative: 1 % (ref 0–1)
Monocytes Relative: 7 % (ref 3–12)
Neutro Abs: 3.8 10*3/uL (ref 1.7–7.7)
Neutrophils Relative %: 53 % (ref 43–77)

## 2010-10-12 LAB — CBC
MCHC: 34.7 g/dL (ref 30.0–36.0)
RBC: 4.02 MIL/uL — ABNORMAL LOW (ref 4.22–5.81)
RDW: 12.9 % (ref 11.5–15.5)

## 2010-10-16 LAB — POCT I-STAT, CHEM 8
BUN: <3 mg/dL — ABNORMAL LOW (ref 6–23)
Calcium, Ion: 1.08 mmol/L — ABNORMAL LOW (ref 1.12–1.32)
Creatinine, Ser: 0.9 mg/dL (ref 0.4–1.5)
TCO2: 22 mmol/L (ref 0–100)

## 2010-10-16 LAB — DIFFERENTIAL
Lymphocytes Relative: 23 % (ref 12–46)
Lymphs Abs: 1.9 10*3/uL (ref 0.7–4.0)
Monocytes Relative: 5 % (ref 3–12)
Neutro Abs: 5.5 10*3/uL (ref 1.7–7.7)
Neutrophils Relative %: 68 % (ref 43–77)

## 2010-10-16 LAB — RAPID URINE DRUG SCREEN, HOSP PERFORMED
Amphetamines: NOT DETECTED
Cocaine: POSITIVE — AB
Opiates: NOT DETECTED
Tetrahydrocannabinol: POSITIVE — AB

## 2010-10-16 LAB — CBC
RBC: 4.69 MIL/uL (ref 4.22–5.81)
WBC: 8.1 10*3/uL (ref 4.0–10.5)

## 2010-10-24 LAB — CBC
Hemoglobin: 13.3 g/dL (ref 13.0–17.0)
Hemoglobin: 13.6 g/dL (ref 13.0–17.0)
MCHC: 34.1 g/dL (ref 30.0–36.0)
MCHC: 34.4 g/dL (ref 30.0–36.0)
MCV: 97.8 fL (ref 78.0–100.0)
Platelets: 318 10*3/uL (ref 150–400)
RBC: 4.03 MIL/uL — ABNORMAL LOW (ref 4.22–5.81)
RDW: 12.8 % (ref 11.5–15.5)
RDW: 13.4 % (ref 11.5–15.5)

## 2010-10-24 LAB — BASIC METABOLIC PANEL
BUN: 10 mg/dL (ref 6–23)
CO2: 25 mEq/L (ref 19–32)
Calcium: 9.1 mg/dL (ref 8.4–10.5)
Calcium: 9.5 mg/dL (ref 8.4–10.5)
Chloride: 105 mEq/L (ref 96–112)
Creatinine, Ser: 0.85 mg/dL (ref 0.4–1.5)
Creatinine, Ser: 1.1 mg/dL (ref 0.4–1.5)
GFR calc Af Amer: >60 mL/min (ref >60)
GFR calc non Af Amer: >60 mL/min (ref >60)
Glucose, Bld: 90 mg/dL (ref 70–99)
Glucose, Bld: 93 mg/dL (ref 70–99)
Sodium: 136 mEq/L (ref 135–145)
Sodium: 138 mEq/L (ref 135–145)

## 2010-10-24 LAB — URINALYSIS, ROUTINE W REFLEX MICROSCOPIC
Glucose, UA: NEGATIVE mg/dL
Nitrite: NEGATIVE
Protein, ur: 100 mg/dL — AB
Specific Gravity, Urine: 1.017 (ref 1.005–1.030)
Urobilinogen, UA: 0.2 mg/dL (ref 0.0–1.0)
pH: 6 (ref 5.0–8.0)

## 2010-10-24 LAB — URINE MICROSCOPIC-ADD ON

## 2010-10-24 LAB — DIFFERENTIAL
Basophils Absolute: 0 10*3/uL (ref 0.0–0.1)
Basophils Absolute: 0.1 10*3/uL (ref 0.0–0.1)
Basophils Relative: 0 % (ref 0–1)
Basophils Relative: 1 % (ref 0–1)
Eosinophils Absolute: 0.3 10*3/uL (ref 0.0–0.7)
Lymphocytes Relative: 22 % (ref 12–46)
Monocytes Absolute: 0.7 10*3/uL (ref 0.1–1.0)
Monocytes Relative: 9 % (ref 3–12)
Neutro Abs: 4.8 10*3/uL (ref 1.7–7.7)
Neutro Abs: 6.3 10*3/uL (ref 1.7–7.7)
Neutrophils Relative %: 68 % (ref 43–77)

## 2010-10-25 LAB — URINE CULTURE
Colony Count: NO GROWTH
Culture: NO GROWTH

## 2010-10-25 LAB — POCT I-STAT, CHEM 8
Calcium, Ion: 1.2 mmol/L (ref 1.12–1.32)
Creatinine, Ser: 0.8 mg/dL (ref 0.4–1.5)
Glucose, Bld: 92 mg/dL (ref 70–99)
Hemoglobin: 13.9 g/dL (ref 13.0–17.0)
Potassium: 3.5 mEq/L (ref 3.5–5.1)
TCO2: 23 mmol/L (ref 0–100)

## 2010-10-25 LAB — URINE MICROSCOPIC-ADD ON

## 2010-10-25 LAB — URINALYSIS, ROUTINE W REFLEX MICROSCOPIC
Ketones, ur: 15 mg/dL — AB
Nitrite: POSITIVE — AB
Protein, ur: 30 mg/dL — AB
Urobilinogen, UA: 1 mg/dL (ref 0.0–1.0)

## 2010-12-09 NOTE — Discharge Summary (Signed)
NAME:  [name], [name] NO.:  [id number]   MEDICAL RECORD NO.:  [id number]                   PATIENT TYPE:  INP   LOCATION:  5707                                 FACILITY:  [facility]   PHYSICIAN:  Beatrix Fetters, MD                      DATE OF BIRTH:  November 04, 1980   DATE OF ADMISSION:  05/07/2003  DATE OF DISCHARGE:  05/10/2003                                 DISCHARGE SUMMARY   DISCHARGE DIAGNOSES:  1. Nausea, vomiting, diarrhea, abdominal pain.  2. History of Crohn's disease.  3. History of cocaine abuse.  4. History of bipolar disorder.  5. Tobacco abuse.   DISCHARGE MEDICATIONS:  1. Pentasa 750 mg q.i.d. p.o.  2. Wellbutrin XL 300 mg one p.o. every day.  3. Seroquel 25 mg p.o. t.i.d.  4. Trazodone 300 mg p.o. q.h.s.  5. Prednisone 10 mg tablets in the following tapered fashion:  Three 10 mg     tablets for three days by mouth, then two 10 mg tablets for four days by     mouth, then one 10 mg tablet once daily until finished.  6. Percocet 5/325 mg, 1-2 pills every six hours as needed for pain.  7. Prevacid 30 mg one p.o. every day.  It should be noted that samples of Prevacid were provided and samples of  prednisone were provided.   HISTORY OF PRESENT ILLNESS:  [name] is a 30 year old white male with a  recent diagnosis of Crohn's disease by colonoscopy in July 2004 and a  history of suicidal ideation requiring inpatient psychiatric stay who  presented complaining of a two week history of worsening abdominal pain,  with a one week history of nausea especially with attempts to eat and two  different episodes of non-bloody, non-bilious emesis.  The patient reported  decreased p.o. intake at that time and non-bloody diarrhea for four days.  The patient had reported to the [facility] Emergency Department two nights  prior to this admission.  He was given Pepcid and hydrocodone and sent home.  He presented to the [facility] Emergency Department on  the same day of  admission with persistent abdominal pain, that was not reduced with morphine  sulfate 4 mg x 3.  The patient was transferred from [facility] to  [facility] by [facility].  Of note, the patient reported running out  of Pentasa approximately two weeks prior to the onset of the abdominal pain.  The patient noted at that time he was allergic to penicillin and Benadryl.   PHYSICAL EXAMINATION:  VITAL SIGNS:  Heart rate 64, BP 94/53, temp 98.2,  respirations 18, 97% on room air.  GENERAL:  This is a cachectic, young white male.  HEENT:  Injected sclera, poor dentition.  ABDOMEN:  Diffusely tender on palpation.  No organomegaly.  No masses.  No  rebound.  Decreased bowel  sounds.  MENTAL STATUS:  Blunted affect.  Denied  suicidal or homicidal ideation at that time.   LABS AND IMAGES:  Chest x-ray at [facility], and abdominal x-ray  at [facility] were both normal.   B-MET:  Sodium 137, potassium 3.5, chloride 110, bicarb 26, BUN 13,  creatinine 1.0, glucose 64.  Bilirubin 0.4, alk phos 86, AST 16, ALT 13,  protein 6.7, albumin 3.7, calcium 9.6.  White blood cell count 10.3,  hemoglobin 15, hematocrit 43.8, platelets 359, ANC 7, MCV 89.2.  The patient  was hemoccult negative.  The patient had an amylase drawn, on May 05, 2003, which was 31.   HOSPITAL COURSE:  Problem #1.  Nausea, vomiting, diarrhea, abdominal pain.  The patient's history of recent Crohn's diagnosis consistent with his  current symptoms.  His exam was not consistent with cholecystitis,  appendicitis or pancreatitis and the liver panel was, as already mentioned,  normal.  The patient was placed on bowel rest and treated presumptively for  Crohn's flare up with p.o. prednisone and mezilamine.  He was given Protonix  p.o. for GERD prophylaxis treatment, also given multivitamin, thiamine and  the additional supplement.  He was given Demerol p.r.n. for pain and his  urine  and stool were sent for culture.  During the course of his stay, the  patient had intermittent abdominal pain, no emesis, diarrhea that faded.  He  was able to tolerate clear sips and eventually able to tolerate a full diet.  He was afebrile with a white count of 8.2 at the time of discharge.  C. diff  toxin stool culture was negative at the time of discharge.   Problem #2.  History of Crohn's.  The patient responded well to Pentasa and  steroids with resolution of his abdominal pain and ability to take p.o.  Given the history of running out of his medication, Pentasa, two weeks prior  to the onset of abdominal pain, it is likely that the patient just needs to  stay on this medication in order to prevent flare ups of Crohn's.  He was  sent home on a steroid taper and with a prescription for Pentasa as  mentioned in discharge medications.  The patient was also given Percocet for  pain and Prevacid for reflux.   Problem #3.  History of cocaine abuse.  The patient's urine drug screen was  positive for benzodiazepines, cocaine and opiates, despite the fact that he  denied use for weeks prior to admission.   Problem #4.  History of bipolar disorder.  The patient had a flat affect at  the time of admission.  He was maintained on his Seroquel and Wellbutrin XL  during his stay.  He did not demonstrate signs of mania.  He had been  diagnosed in September of 2004 at __________ [place].  As  such psych evaluation was not warranted at this time.  The patient was  discharged with his known doses Wellbutrin and Seroquel as stated in the  discharge medications.   Problem #5.  Tobacco abuse.  The patient was given a nicotine patch 21 mg  every day.  He seemed to tolerate this well.  He was counseled about  discontinuing tobacco and cocaine.   DISPOSITION:  As stated previously, the patient was discharged on a steroid  taper and Pentasa to control his Crohn's disease.  He was told to  call [facility] or go to the emergency department if he  had recurrence of severe  abdominal pain or continued diarrhea.  He is also to call [facility] for  followup appointment within a few weeks of discharge.   DISCHARGE LABS:  Sodium 141, potassium 3.6, chloride 114, bicarb 23, BUN 5,  creatinine 0.8, glucose 77, calcium 8.2.  White blood cell count 8.2,  hemoglobin 11.3, hematocrit 33.3, platelets 269.  C. diff was negative at  the time of discharge.  UA was negative.  Lipase was 21.                                                Beatrix Fetters, MD    TD/MEDQ  D:  06/19/2003  T:  06/19/2003  Job:  [id number]

## 2010-12-09 NOTE — H&P (Signed)
NAME:  [name], [name] NO.:  [id number]   MEDICAL RECORD NO.:  [id number]                   PATIENT TYPE:  INP   LOCATION:  0448                                 FACILITY:  [facility]   PHYSICIAN:  Gatha Mayer, M.D. [facility]           DATE OF BIRTH:  [birth year]   DATE OF ADMISSION:  04/02/2004  DATE OF DISCHARGE:                                HISTORY & PHYSICAL   CHIEF COMPLAINT:  Abdominal pain, vomiting, Crohn's disease.   HISTORY:  1.  This is a 30 year old white man who has a history of Crohn's disease,      having had this diagnosed by Dr. Delfin Edis in October of 2004.  He was      started on Pentasa, taking approximately 750 mg x4 daily.  He says he      has been compliant with that.  He has had difficulty in following up and      does not sound like he has, because of lack of insurance.  He had a      couple of flares that he handled on his own it seems, in the last few      months, but over the past 6 days he has had increasing and persistent      nausea and vomiting to the point where he is nauseated constantly, he      vomits everything, is unable to keep his medications down.  He thinks he      might have had a fever but is not sure.  He has no diarrhea or      constipation.  There is no bleeding reported.  He thinks he has a left      inguinal hernia.  2.  He had a knee injury on the job recently but is otherwise not having any      particular problems.  3.  Bipolar disorder is controlled on Seroquel.  4.  Previous history of cocaine use which he says has resolved.  He is a      smoker as well and is unable to quit.   He was in the emergency department on March 30, 2004 where lab testing  showed hemoglobin 12.9, white count 11,700.  He has had a CT scan which  showed thickening of the terminal ileum and ulceration but no discreet  obstructions.  He has had some mild distention of his small bowel loops.  He  has some enlarged mesenteric  lymph nodes.  His fiance called tonight stating  that he was unable to keep anything down so he was admitted to the hospital.   MEDICATIONS:  1.  Seroquel 25 mg t.i.d.  2.  Pentasa 750 mg q.i.d.  3.  Phenergan p.r.n.  4.  Vicodin p.r.n.   ALLERGIES:  PENICILLIN.   PAST MEDICAL HISTORY:  As above. Bipolar disorder.  He did have a hernia  repair as a child.  He has a history of cocaine abuse and he is a smoker.   SOCIAL HISTORY:  He continues to smoke.  No alcohol or drugs.  He is  engaged.  His fiance is attentive and here.  He works for a heating,  IT trainer.   REVIEW OF SYSTEMS:  As per HPI, all other systems are negative.   FAMILY HISTORY:  Noncontributory at this point.   PHYSICAL EXAMINATION:  GENERAL:  Reveals a thin, pale, chronically ill-  appearing white man.  VITAL SIGNS:  Temperature 100.8, pulse 71, respirations 20, blood pressure  114/70.  HEENT:  The eyes are anicteric.  Mouth - posterior pharynx free of lesions.  NECK:  Supple, no thyromegaly or mass.  CHEST:  Clear.  HEART:  S1, S2 normal without murmurs or gallops.  ABDOMEN:  Soft, bowel sounds are present, there is some mild left lower and  right lower quadrant tenderness.  There is a bulge but no discreet hernia in  the left inguinal area.  This is tested with him standing.  RECTAL:  Inspection of the rectum shows no problem.  EXTREMITIES:  Free of edema.  SKIN:  Pale.  There are tattoos.  NEURO:  He is alert and oriented x2.   LABORATORY DATA:  Is pending at this time.   ASSESSMENT:  1.  Crohn's flare with nausea and vomiting and abdominal pain.  2.  Bipolar disorder under control.  3.  Smoker.  4.  History of drug abuse.  5.  Mild anemia on recent labs of March 30, 2004.   PLAN:  Hydrate, start IV steroids, IV analgesia and antiemetics.  Recheck  labs.  Check 2-view abdomen.  Further plans pending course.  Will  reinstitute his Pentasa once his symptoms are  under control and will  continue his Seroquel.   I appreciate the opportunity to care for this patient.                                               Gatha Mayer, M.D. [facility]    CEG/MEDQ  D:  04/02/2004  T:  04/03/2004  Job:  [id number]   cc:   Delfin Edis, M.D. [facility]

## 2010-12-09 NOTE — Discharge Summary (Signed)
NAMEKOREN, [name] NO.:  [id number]   MEDICAL RECORD NO.:  [id number]          PATIENT TYPE:  INP   LOCATION:  [place]                         FACILITY:  [facility]   PHYSICIAN:  [name] Kitchen T. Hoxworth, M.D.DATE OF BIRTH:  1981-03-06   DATE OF ADMISSION:  01/05/2006  DATE OF DISCHARGE:  01/18/2006                                 DISCHARGE SUMMARY   DISCHARGE DIAGNOSIS:  Crohn's disease with stricture.   SECONDARY DIAGNOSIS:  Left inguinal hernia.   SURGICAL PROCEDURES:  1. Ileocolectomy with anastomosis.  2. Repair of left inguinal hernia, surgery date January 12, 2006.   HISTORY OF PRESENT ILLNESS:  This patient is a 30 year old male with a long  history of Crohn's disease of the terminal ileum diagnosed in 2004.  He has  had a number of treatments with steroids for flares and been maintained on  Pentasa.  He has had recently increasing bouts of small bowel obstruction  and now presents with persistent high-grade small bowel obstruction with  nausea, vomiting, abdominal pain, bloating.   PAST MEDICAL HISTORY:  Crohn's disease, bipolar disorder, history of  substance abuse, anemia, remote right inguinal hernia repair.   MEDICATIONS ON ADMISSION:  Seroquel and Pentasa.   ALLERGIES:  PENICILLIN and BENADRYL.   SOCIAL HISTORY:  See detailed H&P.   FAMILY HISTORY:  See detailed H&P.   REVIEW OF SYSTEMS:  See detailed H&P.   PERTINENT PHYSICAL EXAMINATION:  GENERAL:  A pale, thin white male.  VITAL SIGNS:  He is afebrile.  Vital signs within normal limits.  ABDOMEN:  Somewhat distended.  Mildly tender, right lower quadrant.   HOSPITAL COURSE:  The patient was admitted to [facility] GI, Dr. Ardis Hughs.  He  was started on bowel rest, IV steroids and Protonix.  Also noted on exam was  a good-sized left inguinal hernia.  The patient had some improvement with  medical management but due to worsening and recurrent episodes likely  resulting in stricture and history of  noncompliance we were asked to see the  patient in consultation.  Resection was recommended.  In addition, repair of  his left inguinal hernia was recommended.  This was accepted, and he was  taken to the operating room on June 22.  Underwent ileocolectomy with  anastomosis and repair of his inguinal hernia.  Postoperatively, the patient  did reasonably well, although he did have a lot of anxiety and somewhat  difficult behavior while in the hospital.  This did improve as the days went  on.  He was started on clear liquids on the third postoperative day.  This  was able to be advanced to a regular diet.  He had some mild wound  cellulitis and was treated with IV Tygacil, and  this resolved without any drainage or abscess noted.  Final pathology  revealed inflammatory change consistent with Crohn's.  He was discharged  home on June 28.  Both wounds were healing well.  Tolerating regular diet.  The only medication is Seroquel and Vicodin for pain.  Followup is to be in  my office in two weeks.  [name]. Hoxworth, M.D.  Electronically Signed     BTH/MEDQ  D:  03/21/2006  T:  03/22/2006  Job:  [id number]   cc:   Milus Banister, MD  [address]  [place], [place] [address]

## 2010-12-09 NOTE — Discharge Summary (Signed)
NAME:  [name], [name]                        ACCOUNT NO.:  [id number]   MEDICAL RECORD NO.:  [id number]                   PATIENT TYPE:  INP   LOCATION:  0448                                 FACILITY:  [facility]   PHYSICIAN:  Sandy Salaam. Deatra Ina, M.D. [facility]          DATE OF BIRTH:  1981/04/25   DATE OF ADMISSION:  04/02/2004  DATE OF DISCHARGE:  04/06/2004                                 DISCHARGE SUMMARY   ADMISSION DIAGNOSES:  76.  A 30 year old male with Crohn's ileitis with flare with nausea, vomiting      and abdominal pain concerning for low grade obstruction.  2.  Bipolar disorder.  3.  Smoker.  4.  Previous history of drug abuse.  5.  Mild anemia.   DISCHARGE DIAGNOSES:  1.  Crohn's ileitis with exacerbation symptomatically improved.  2.  Bipolar disorder.  3.  Smoker.  4.  Previous history of drug abuse.  5.  Mild anemia.   CONSULTATIONS:  None.   PROCEDURE:  Plain abdominal films, patient had CT scan of the abdomen and  pelvis within one week prior to admission.   BRIEF HISTORY:  [name] is a 30 year old white male with history of Crohn's  disease seen in the past by Dr. Delfin Edis in October of 2004. He was  initially treated with Pentasa and had been taking up to three tablets four  times daily.  He says that he has had a couple of flares since that time  with abdominal pain and nausea which he self treated but over the past six  days had had increasing difficulty with nausea and vomiting and abdominal  pain. He said he had been unable to keep down anything and had had a visit  to the emergency room on September 7 at which time he had CT scan of the  abdomen and pelvis that showed thickening of the terminal ileum and  ulceration but no discreet obstruction.  He had some mild distention of  small bowel loops and some adenopathy in the mesentery.  The patient called  on the night of admission stating that he had been sick for six days and was  unable to keep anything down  and with that CT finding was admitted to the  hospital for Crohn's exacerbation for medical management.   LABORATORY DATA:  On admission September 11, WBC of 9.2, hemoglobin 12.2,  hematocrit of 35.9, MCV 88, platelets 332.  Followup on September 13, WBC of  13.3, hemoglobin 11.1, hematocrit of 33.6, electrolytes within normal  limits, glucose was 160 on admission. BUN 6, creatinine 0.8, albumin 2.5,  liver function studies normal.  Drug screen was positive for marijuana,  metabolites, benzo and propoxyphene.  UA was negative.   Plain abdominal films on admission no evidence for free air, no evidence for  bowel obstruction.   HOSPITAL COURSE:  The patient was admitted to the service of Dr. Silvano Rusk. He was  n.p.o. with sips and chips, started on IV fluids, Demerol  and Phenergan as needed for control of pain and nausea and placed on high  dose IV Solu-Medrol at 40 q. 12.  He was continued on his home medications  of Seroquel and trazodone.  His vomiting was under control fairly quickly,  he continued somewhat nauseated over the next couple of days bloated and  uncomfortable and remained on clear liquids. By the 13th, he was feeling  significantly better and was sneaking food from [employer] and stated  that he was hungry and ready to try solid food.  His pain was decreased at  that point, he had had no further nausea or vomiting. We did advance his  diet to low residue, he seemed to tolerate this without difficulty,  converted his steroids to p.o. as well as his pain medication and on  September 14, he remained stable, tolerated a diet without difficulty and  was allowed discharged to home with instructions to followup with Dr. Delfin Edis on September 28 at 11 a.m.  He was to call for any problems in the  interim. He was to stay on a low residue diet and Pentasa 250 mg 4 tablets  p.o. q.i.d., prednisone 40 mg p.o. q.d. and Vicodin 1 p.o. q. 4-6 hour  p.r.n. pain.   CONDITION  ON DISCHARGE:  Stable.   The patient was given a work absence note at the time of discharge to cover  him for the time of his hospitalization and through September 18.      AE/MEDQ  D:  04/08/2004  T:  04/09/2004  Job:  [id number]

## 2010-12-09 NOTE — Consult Note (Signed)
NAMEAIVEN, KAMPE NO.:  [id number]   MEDICAL RECORD NO.:  [id number]          PATIENT TYPE:  INP   LOCATION:  76                         FACILITY:  [facility]   PHYSICIAN:  Georgina Quint, M.D.   DATE OF BIRTH:  [birth year]   DATE OF CONSULTATION:  01/08/2006  DATE OF DISCHARGE:                                   CONSULTATION   CHIEF COMPLAINT:  Bowel obstruction.   PRESENT ILLNESS:  Mr. Falwell is a 30 year old man who has had the  diagnosis of Crohn's disease since 2004.  He has had multiple flares of the  disease treated by steroids.  He has been on Pentasa 1000 mg four times a  day.  Three days ago, he was admitted to the hospital with vomiting and  abdominal pain and distension.  CT scan shows active Crohn's disease and  possible stricture of the terminal ileum with dilatation of proximal small  bowel.  The patient has persisted with picture of partial small bowel  obstruction while in the hospital although he stopped vomiting.  CT scan  contrast has passed into the colon.  Because of the chronicity of this  problem, the patient's intermittent failure to comply with medical regimen  and his young age with likelihood of severe problems in the future, I have  been asked to see him for consideration of resection.   PAST MEDICAL HISTORY:  He is generally healthy.  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  He has not had any surgery on the left groin  but had a right inguinal hernia repair a long time ago.  His diagnosis of  bipolar disorder and for that is on Seroquel 150 mg twice a day.   He says he is allergic to PENICILLIN and has a bad reaction to BENADRYL.   He smokes cigarettes.  Drinks alcoholic beverages in moderation.  He has a  history of cocaine use but denies it at this time.   The remainder of the medical history and review of systems is entirely  negative.   PHYSICAL EXAMINATION:  GENERAL APPEARANCE:  The patient is thin but  generally healthy appearing young man.  VITAL SIGNS:  Unremarkable as recorded by nursing staff.  No acute distress.  HEENT:  The head and neck exam is unremarkable with except for carious  teeth.  No neck masses.  No thyroid enlargement.  CHEST:  Clear to auscultation.  HEART:  Regular and rhythm normal.  No murmur or gallop.  ABDOMEN:  Moderately distended and tympanitic, slightly tender, increased  bowel sounds.  A tiny umbilical defect is noted with no evidence of  herniation of viscera into it.  There is a reducible left inguinal hernia.  No masses are detectable.  EXTREMITIES:  No lesions are noted.  Good pulses, no edema.  NEUROLOGICAL:  Grossly normal.  SKIN:  Multiple tattoos, otherwise no lesions.  LYMPH NODES:  None enlarged in the groin, axilla or neck.   IMPRESSION:  1.  Crohn's disease with stricture of the terminal ileum productive of      partial small-bowel obstruction.  2.  Symptomatic left inguinal hernia.  3.  Bipolar disorder.   PLAN:  I recommend elective but semi-urgent exploration and resection of the  diseased segment of bowel.  I believe it would probably be safe to perform  the hernia repair, same anesthetic, through a separate incision.  The  patient is very interested in having this done, but needs to take care of  some personal issues before he will let me post the operation.  I will see  him again in the morning in follow-up.      Georgina Quint, M.D.  Electronically Signed     WB/MEDQ  D:  01/08/2006  T:  01/09/2006  Job:  [id number]

## 2010-12-09 NOTE — H&P (Signed)
NAMETIPTON, BALLOW NO.:  [id number]   MEDICAL RECORD NO.:  [id number]          PATIENT TYPE:  EMS   LOCATION:  ED                           FACILITY:  [facility]   PHYSICIAN:  Milus Banister, M.D. DATE OF BIRTH:  1981-02-29   DATE OF ADMISSION:  01/05/2006  DATE OF DISCHARGE:                                HISTORY & PHYSICAL   Please note:  The patient has two medical record numbers.  The current  working medical record number for this admission is [id number], but prior  medical record number is [id number].   REASON FOR ADMISSION:  Nausea, vomiting, abdominal pain and CT scan showing  partial small bowel obstruction with active Crohn's at the terminal ileum.   HISTORY OF PRESENT ILLNESS:  [name] is a 30 year old white male who  has a history of Crohn's disease of the terminal ileum diagnosed in 2004  when he was on the teaching service and Dr. Olevia Perches did a colonoscopy.  The  patient has had variable compliance with Pentasa therapy, of which he is  supposed to be taking four pills 4 times a day.  He has also been treated  with steroids in the past for flares.  He was admitted September 9-14, 2005,  for flare of his disease associated with a partial obstruction at the  terminal ileum.  He responded well to IV steroids and the Pentasa therapy.  He followed up once in 2005 with Dr. Olevia Perches in the office, but there is no  record electronically of his having had any other office visits with her.  However, he has had frequent visits to the ER for complaints of abdominal  pain.  The most recent, besides today, was in March 2007 and on that day,  March 12, he had a CT scan that showed active Crohn's at the terminal ileum  associated with some partial obstruction that radiology as chronic.  For the  past week, beginning Sunday, today is Friday, he has developed nausea and  vomiting and then four about 4 days he has had abdominal pain in the right  lower quadrant  associated with bloating.  Unable to keep much down in terms  of p.o.'s, and he called Dr. Nichola Sizer office but did not get an answer back  as to when he could come in for an office visit so he went over to [facility]  on Wednesday, where he got a shot of Phenergan and a new prescription for  Pentasa.  He said he had not used the Pentasa since last July of 2006.  He  has been a lot longer than that since he was on steroids, probably after  running out of steroids maybe around December 2005 or January 2006.  The  patient says he can only make fracture doctor appointments to see Dr. Olevia Perches  and that it has been hard for him to get to the office because either she is  not available on a Friday when he is available or because he does not have  transportation.  Note that he only lives about 2 miles away from Dr.  Brodie's office and from [facility].   The patient came to the emergency room today because he is having ongoing  nausea, vomiting, abdominal pain and bloating.  His stools are loose.  They  are dark but not bloody.  Emergency room physician obtained a CT scan, and  this showing stable appearance to the Crohn disease in the distal small  bowel but the obstructive pattern is slightly increased and there is some  new distention of the urinary bladder.  The patient denies any oliguria,  dysuria or hesitancy.  He has been taking the Pentasa and able to keep it  down for the most part since receiving this prescription on Wednesday.   ALLERGIES:  PENICILLIN and to BENADRYL.  He says that it was something about  the dye in the BENADRYL that made him allergic.   MEDICATIONS:  1.  Seroquel 150 mg twice daily.  2.  Pentasa 1000 mg four times daily.   PAST MEDICAL HISTORY:  1.  Crohn disease.  2.  Bipolar disorder.  3.  History of substance abuse, specifically cocaine in 2004.  4.  Anemia.  5.  Remote hernia repair.   SOCIAL HISTORY:  The patient lives alone in [place].  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  He is a smoker.  Denies use  of alcohol or illicit drugs recently but has used cocaine in the past.   FAMILY HISTORY:  Noncontributory for inflammatory bowel disease, GI cancers  or ulcer disease.   REVIEW OF SYSTEMS:  Urinary symptoms:  Again, no hesitancy, no hematuria, no  oliguria.  Generally, he has lost about 10 pounds in the last 10 days.  Denies fevers.  Does feel weak and fatigued.  Denies significant muscle  aches or pain.  Denies history of seizures.  Denies headaches.  Denies  excessive thirst.   LABORATORY DATA:  Hemoglobin is 13.9, hematocrit is 41.2, platelet count is  391, white blood cell count is 12.3, MCV is 92.1.  Sodium is 139, potassium  is 3.1.  BUN is 10, creatinine 0.9.  chloride 108, CO2 23.  Glucose is 96.  CT scan showing long segment of Crohn disease in the distal small bowel,  which includes the terminal ileum.  Mildly progressive pattern of partial  small bowel obstruction and bladder distention which is new.   PHYSICAL EXAMINATION:  GENERAL:  The patient is a pale, thin, nontoxic but  unhealthy-appearing young white gentleman who is pleasant and cooperative.  His affect is flat.  He does not appear in acute discomfort.  VITAL SIGNS:  Blood pressure 89/62, pulse of 60, respirations 18, room air  saturation 98% and temperature is 96.8.  HEENT:  There is no conjunctival pallor.  Extraocular movements are intact.  Oropharynx is clear and moist.  Dentition in fair repair.  NECK:  There are no masses, no JVD, no bruits.  CHEST:  Clear to auscultation and percussion bilaterally.  No cough, no  increased work of breathing.  CARDIAC:  There is regular rate and rhythm, no murmurs, rubs or gallops.  ABDOMEN:  Firm, somewhat distended, and tender in the right lower quadrant.  No guarding or rebound present.  No bruits.  No masses. RECTAL, GENITOURINARY:  Exams were deferred.  EXTREMITIES:  No  cyanosis, clubbing or edema.  MUSCULOSKELETAL:  No joint deformity.  NEUROPSYCHIATRIC:  Affect is blunted.  The patient is alert and oriented x3.  There is no tremor.  He moves all four limbs without difficulty.   IMPRESSION:  1.  Crohn's flare in patient who has been noncompliant with inflammatory      bowel disease medications for nearly a year.  2.  Ileal and stricturing secondary to Crohn's.  Question whether there is      an element of chronic fixed stricture as well.  3.  Hypokalemia.  4.  Mild leukocytosis.  5.  Bipolar disorder.  6.  History of medical noncompliance.   PLAN:  Patient to be admitted to the [facility] GI service, Milus Banister,  M.D.  Plan for IV steroids, IV potassium to be added into IV fluids.  Bowel  rest.  Will give him Pentasa, Protonix, and patient may require an NG tube  for refractory symptoms.  Will plan to follow up the CT scan with a plain x-  ray tomorrow to assess the obstructive process.      [name], P.A. [facility]    ______________________________  Milus Banister, M.D.    SG/MEDQ  D:  01/05/2006  T:  01/05/2006  Job:  [id number]

## 2010-12-09 NOTE — Op Note (Signed)
NAME:  [name], [name] NO.:  [id number]   MEDICAL RECORD NO.:  [id number]                   PATIENT TYPE:  INP   LOCATION:  5727                                 FACILITY:  [facility]   PHYSICIAN:  Delfin Edis, M.D. [facility]               DATE OF BIRTH:  06/30/1981   DATE OF PROCEDURE:  DATE OF DISCHARGE:                                 OPERATIVE REPORT   PROCEDURE:  Colonoscopy.   HISTORY:  This 30 year old white male was admitted with failure to thrive,  abdominal pain, nausea and vomiting, and diarrhea with intermittent rectal  bleeding.  A CT scan of the abdomen showed a thickened terminal ileum  consistent with an inflammatory process.  He is undergoing a colonoscopy to  rule out Crohn's disease.   ENDOSCOPE:  Fujinon single-channel video endoscope.   SEDATION:  1. Versed 10 mg IV.  2. Fentanyl 100 mcg IV.   FINDINGS:  The Fujinon single-channel video endoscope was passed under  direct vision into the rectum and sigmoid colon.  The patient was monitored  by pulse oximetry.  Oxygen saturations were normal.  Anal canal and rectum  __________ was unremarkable.  The sigmoid colon was traversed without  difficulty and it showed normal appearing mucosa.  There was no evidence of  acute ulcerations and or any inflammatory changes throughout the entire  colon.  The splenic flexure, transverse colon, and hepatic flexure were  normal as well as the ascending colon and the cecum.  The ileocecal valve  was entered without difficulty and showed marked ulcerations of the terminal  ileum which was examined over the length of at least 20 cm.  There were  multiple aphthous ulcers, edema, friability, and irregularity.  Biopsies  were taken from the terminal ileum.  The endoscope was then retracted and  the colon was decompressed.  The patient tolerated the procedure well.   IMPRESSION:  1. Terminal ileitis consistent with Crohn's disease status post multiple  biopsies.  2. Normal colon and rectum.    PLAN:  The patient will be treated for inflammatory bowel disease with  intravenous steroids.  He will possibly also require __________ and Remicade  infusion but for now we will add Pentasa and low dose Flagyl.  There is no  evidence of colon or rectal involvement.  His nutritional status has been  compromised and that problem will have to be addressed with dietary  supplements and possibly even TTN, depending on how fast he improves on  steroids.                                               Delfin Edis, M.D. [facility]    DB/MEDQ  D:  02/18/2003  T:  02/18/2003  Job:  [id number]   cc:  Thomes Lolling, M.D.  [address]  [place] [address]  Fax: [phone]

## 2010-12-09 NOTE — Op Note (Signed)
[name], [name]              ACCOUNT NO.:  [id number]   MEDICAL RECORD NO.:  [id number]          PATIENT TYPE:  INP   LOCATION:  62                         [facility]ACILITY:  [facility]   PHYSICIAN:  [name] Kitchen T. Hoxworth, M.D.DATE OF BIRTH:  September 24, 1980   DATE OF PROCEDURE:  01/12/2006  DATE OF DISCHARGE:                                 OPERATIVE REPORT   PREOPERATIVE DIAGNOSES:  1.  Left inguinal hernia.  2.  Crohn disease of terminal ileum with small-bowel obstruction.   POSTOPERATIVE DIAGNOSES:  1.  Left inguinal hernia.  2.  Crohn disease of terminal ileum with small-bowel obstruction.   SURGICAL PROCEDURES:  1.  Repair of left inguinal hernia.  2.  Ileocolectomy with anastomosis.   SURGEON:  [name]. Hoxworth, M.D.   ANESTHESIA:  General.   BRIEF HISTORY:  [name] is a 30 year old male with a history of Crohn's  disease documented over number of years involving the terminal ileum.  He  has had progressively more frequent and severe episodes of small bowel  obstruction and was admitted with the same several days ago.  CT scan shows  a segment of terminal ileum with marked thickening and narrowing as the  source of obstruction.  He has been through multiple courses of medical  management for this and at this point this is felt to be a area of fixed  obstruction which will require resection.  The patient also had a  symptomatic reducible left inguinal hernia and we will plan to repair this  at the same setting.  The indications for the procedure, natures, risks of  bleeding, infection, anastomotic leak, recurrent Crohn's, recurrent inguinal  hernia have been discussed and understood.  He is now brought to the  operating room for these procedures.   DESCRIPTION OF PROCEDURE:  Patient brought to the operating room and placed  in supine position on the operating room table and general endotracheal  anesthesia induced.  The abdomen was widely sterilely prepped and draped.  Correct patient and procedures were verified.  The patient had undergone  mechanical antibiotic bowel prep preoperatively.  Preoperative IV  antibiotics were given.  PAS were placed.  I approached the inguinal hernia  first.  An oblique incision was made in the left groin and dissection  carried down through subcutaneous tissue.  The external oblique was  identified and divided along the lines of its fibers through the external  ring.  The cord was dissected off the pubic tubercle and cremasteric fibers  were divided up to the internal ring completely freeing the cord.  The  ilioinguinal nerve was identified and protected.  There was a good-sized  indirect sac extending down toward the testicle which was separated from the  cord structures using blunt and cautery dissection and dissected completely  up to the internal ring.  At this point, the sac was suture ligated with 2-0  silk, divided and removed.  The stump retracted nicely above the internal  ring.  The floor appeared relatively intact.  Due to the clean contaminated  procedure to be done simultaneously, I elected to reinforce this  with a  biologic mesh, MTFA cellular dermis.  A 4 x 7 cm piece was used, slot cut  for the cord at the internal ring.  The mesh was sutured initially at the  pubic tubercle and then to the inguinal ligament at the iliopubic track  working medial to lateral with running 2-0 Prolene.  Medially the mesh was  sutured to the edge of the rectus sheath with interrupted 2-0 Prolene.  The  tails were then tacked together lateral to the cord with interrupted 2-0  Prolene creating a new internal ring snugged to a fingertip.  This provided  nice broad coverage of the direct and indirect spaces.  The operative site  was inspected for hemostasis.  The cord and ilioinguinal nerves were  returned to their anatomic position.  The external oblique was closed over  this with running 3-0 Vicryl.  Scarpa's fascia was closed  with running 3-0  Vicryl and the skin closed with staples.  This incision was then isolated  and I then made a midline incision mostly in the lower abdomen but just also  skirting up above the umbilicus and dissection carried down through  subcutaneous tissue and midline fascia.  The peritoneum entered under direct  vision.  There was a small amount of clear peritoneal fluid.  A thorough  exploration was then performed.  Stomach, duodenum, liver, gallbladder all  appeared normal.  The ligament of Treitz was identified and the small bowel  traced distally.  The proximal small bowel all appeared completely normal.  As we approached the distal ileum, the bowel was dilated, consistent with  chronic ongoing obstruction.  The last 30 cm of terminal ileum going to  within a couple of centimeters of the ileocecal valve was extensively  involved with typical appearing Crohn's disease with markedly thickened and  narrowed, obviously chronically obstructed bowel and very thickened  mesentery with multiple nodes.  Again, the bowel was traced proximally and  there was no other evidence of any skip areas at all.  Again this involved  about a 30 cm segment.  The terminal ileum, cecum and proximal right colon  were extensive mobilized dividing peritoneal attachments with cautery.  Points of proximal distal resection were chosen just proximal area of gross  Crohn's disease and distally at the proximal ascending colon.  The mesentery  of this involved segment was then sequentially divided between clamps and  tied with 2-0 silk ties and larger vessels additionally suture ligated.  After complete division of the mesentery, a functional end-to-end  anastomosis was created between the ileum and the right colon with a single  firing of the GIA 75 mm stapler.  The staple line was inspected and was  intact without bleeding.  The common enterotomy was closed and the specimen removed with a single firing of TA-60  stapler.  Following this, the abdomen  was irrigated and then all gloves, instruments and drapes changed.  The  abdomen was further irrigated and complete hemostasis assured.  The  mesenteric defect was closed with interrupted 2-0 silks.  The viscera  returned to the anatomic position.  The midline fascia was closed with  running #1 PDS begun on either end of the incision and tied centrally.  The  subcutaneous tissue was irrigated and skin closed with staples.  Sponge and  needle counts were correct. Dry sterile dressings were applied.  The patient  taken to recovery room in good condition.      [name]. Hoxworth, M.D.  Electronically Signed     BTH/MEDQ  D:  01/12/2006  T:  01/12/2006  Job:  [id number]

## 2010-12-09 NOTE — Assessment & Plan Note (Signed)
[facility]                           GASTROENTEROLOGY OFFICE NOTE   NAME:Jordan, [name]                     MRN:          [id number]  DATE:02/16/2006                            DOB:          1980/11/21    Clifford is a 30 year old young man with Crohn's disease, who has been four  weeks since exploratory laparotomy and terminal ileum resection for  obstructed terminal ileum.  He also had a left inguinal herniorrhaphy by Dr.  Excell Seltzer.  He is doing very well and has gained about 10-12 pounds.  Unfortunately, his father was killed while working Architect, about four  weeks ago, and the patient has been quite depressed and sad, which has  slowed down his progress.   MEDICATIONS:  1.  Seroquel 300 mg twice daily.  2.  Multiple vitamins.  3.  Xanax 1 mg p.r.n.   PHYSICAL EXAMINATION:  VITAL SIGNS:  Blood pressure 100/70, pulse 88, weight  106 pounds.  GENERAL:  He was alert and oriented, appearing somewhat depressed.  LUNGS:  Clear to auscultation.  COR:  With normal S1 and normal S2.  ABDOMEN:  Soft.  Vertical surgical scar.  Normoactive bowel sounds.  Marked  tenderness overlying the scar.  RECTAL EXAM:  Not done.  EXTREMITIES:  No edema.   IMPRESSION:  The patient is a 30 year old white male, currently post-  terminal ileal resection, doing quite well, gaining weight.   PLAN:  1.  I have given him a prescription for Xanax to take on a limited basis      over the period of the next 4-6 weeks.  2.  Samples of Zegerid for gastroesophageal reflux.  3.  New prescription refill on Seroquel 300 mg twice daily.  4.  I will see him in 6 weeks.  He will eventually have to go back on 6-      mercaptopurine 50 mg a day to prevent recurrence of his Crohn's disease,      but I am going to hold off until he gains more weight.                                  Lowella Bandy. Olevia Perches, MD   DMB/MedQ  DD:  02/16/2006  DT:  02/16/2006  Job #:  [id number]   cc:    [name]. [place], MD

## 2010-12-25 ENCOUNTER — Emergency Department (HOSPITAL_COMMUNITY)
Admission: EM | Admit: 2010-12-25 | Discharge: 2010-12-25 | Disposition: A | Discharge status: Home / Self Care | Payer: Self-pay | Attending: Emergency Medicine | Admitting: Emergency Medicine

## 2010-12-25 ENCOUNTER — Emergency Department (HOSPITAL_COMMUNITY): Payer: Self-pay

## 2010-12-25 DIAGNOSIS — R404 Transient alteration of awareness: Secondary | ICD-10-CM | POA: Insufficient documentation

## 2010-12-25 DIAGNOSIS — S63509A Unspecified sprain of unspecified wrist, initial encounter: Secondary | ICD-10-CM | POA: Insufficient documentation

## 2010-12-25 DIAGNOSIS — Z9889 Other specified postprocedural states: Secondary | ICD-10-CM | POA: Insufficient documentation

## 2010-12-25 DIAGNOSIS — R109 Unspecified abdominal pain: Secondary | ICD-10-CM | POA: Insufficient documentation

## 2011-02-09 ENCOUNTER — Emergency Department (HOSPITAL_COMMUNITY): Payer: Self-pay

## 2011-02-09 ENCOUNTER — Emergency Department (HOSPITAL_COMMUNITY)
Admission: EM | Admit: 2011-02-09 | Discharge: 2011-02-09 | Disposition: A | Discharge status: Home / Self Care | Payer: Self-pay | Attending: Emergency Medicine | Admitting: Emergency Medicine

## 2011-02-09 DIAGNOSIS — Z8673 Personal history of transient ischemic attack (TIA), and cerebral infarction without residual deficits: Secondary | ICD-10-CM | POA: Insufficient documentation

## 2011-02-09 DIAGNOSIS — Z8719 Personal history of other diseases of the digestive system: Secondary | ICD-10-CM | POA: Insufficient documentation

## 2011-02-09 DIAGNOSIS — R109 Unspecified abdominal pain: Secondary | ICD-10-CM | POA: Insufficient documentation

## 2011-02-09 LAB — DIFFERENTIAL
Basophils Absolute: 0.1 10*3/uL (ref 0.0–0.1)
Eosinophils Absolute: 0.9 10*3/uL — ABNORMAL HIGH (ref 0.0–0.7)
Eosinophils Relative: 9 % — ABNORMAL HIGH (ref 0–5)
Lymphocytes Relative: 34 % (ref 12–46)
Monocytes Absolute: 0.6 10*3/uL (ref 0.1–1.0)

## 2011-02-09 LAB — CBC
HCT: 45.7 % (ref 39.0–52.0)
MCHC: 35.2 g/dL (ref 30.0–36.0)
MCV: 94.6 fL (ref 78.0–100.0)
Platelets: 255 10*3/uL (ref 150–400)
RDW: 11.9 % (ref 11.5–15.5)

## 2011-02-09 LAB — BASIC METABOLIC PANEL
BUN: 12 mg/dL (ref 6–23)
Creatinine, Ser: 1.11 mg/dL (ref 0.50–1.35)
GFR calc Af Amer: >60 mL/min (ref >60)
GFR calc non Af Amer: >60 mL/min (ref >60)
Potassium: 4.3 mEq/L (ref 3.5–5.1)

## 2011-02-12 ENCOUNTER — Emergency Department: Payer: Self-pay | Admitting: Emergency Medicine

## 2011-03-06 ENCOUNTER — Emergency Department (HOSPITAL_COMMUNITY)
Admission: EM | Admit: 2011-03-06 | Discharge: 2011-03-06 | Disposition: A | Discharge status: Home / Self Care | Payer: Self-pay | Attending: Emergency Medicine | Admitting: Emergency Medicine

## 2011-03-06 DIAGNOSIS — R109 Unspecified abdominal pain: Secondary | ICD-10-CM | POA: Insufficient documentation

## 2011-03-06 DIAGNOSIS — R51 Headache: Secondary | ICD-10-CM | POA: Insufficient documentation

## 2011-03-06 LAB — COMPREHENSIVE METABOLIC PANEL
ALT: 20 U/L (ref 0–53)
AST: 26 U/L (ref 0–37)
Albumin: 4.4 g/dL (ref 3.5–5.2)
Alkaline Phosphatase: 133 U/L — ABNORMAL HIGH (ref 39–117)
Chloride: 104 mEq/L (ref 96–112)
Potassium: 4.3 mEq/L (ref 3.5–5.1)
Sodium: 140 mEq/L (ref 135–145)
Total Bilirubin: 0.4 mg/dL (ref 0.3–1.2)

## 2011-03-06 LAB — DIFFERENTIAL
Basophils Absolute: 0 10*3/uL (ref 0.0–0.1)
Eosinophils Absolute: 0 10*3/uL (ref 0.0–0.7)
Eosinophils Relative: 0 % (ref 0–5)
Neutrophils Relative %: 82 % — ABNORMAL HIGH (ref 43–77)

## 2011-03-06 LAB — CBC
Platelets: 271 10*3/uL (ref 150–400)
RDW: 12 % (ref 11.5–15.5)
WBC: 12.5 10*3/uL — ABNORMAL HIGH (ref 4.0–10.5)

## 2011-03-26 ENCOUNTER — Emergency Department (HOSPITAL_COMMUNITY)
Admission: EM | Admit: 2011-03-26 | Discharge: 2011-03-26 | Disposition: A | Discharge status: Home / Self Care | Payer: Self-pay | Attending: Emergency Medicine | Admitting: Emergency Medicine

## 2011-03-26 DIAGNOSIS — IMO0002 Reserved for concepts with insufficient information to code with codable children: Secondary | ICD-10-CM | POA: Insufficient documentation

## 2011-03-26 DIAGNOSIS — X500XXA Overexertion from strenuous movement or load, initial encounter: Secondary | ICD-10-CM | POA: Insufficient documentation

## 2011-03-26 DIAGNOSIS — M549 Dorsalgia, unspecified: Secondary | ICD-10-CM | POA: Insufficient documentation

## 2011-03-31 ENCOUNTER — Emergency Department (HOSPITAL_COMMUNITY)
Admission: EM | Admit: 2011-03-31 | Discharge: 2011-03-31 | Disposition: A | Discharge status: Home / Self Care | Payer: Self-pay | Attending: Emergency Medicine | Admitting: Emergency Medicine

## 2011-03-31 DIAGNOSIS — S51809A Unspecified open wound of unspecified forearm, initial encounter: Secondary | ICD-10-CM | POA: Insufficient documentation

## 2011-03-31 DIAGNOSIS — X789XXA Intentional self-harm by unspecified sharp object, initial encounter: Secondary | ICD-10-CM | POA: Insufficient documentation

## 2011-04-27 ENCOUNTER — Emergency Department (HOSPITAL_COMMUNITY): Payer: Self-pay

## 2011-04-27 ENCOUNTER — Emergency Department (HOSPITAL_COMMUNITY)
Admission: EM | Admit: 2011-04-27 | Discharge: 2011-04-27 | Disposition: A | Discharge status: Home / Self Care | Payer: Self-pay | Attending: Emergency Medicine | Admitting: Emergency Medicine

## 2011-04-27 DIAGNOSIS — M79609 Pain in unspecified limb: Secondary | ICD-10-CM | POA: Insufficient documentation

## 2011-04-27 DIAGNOSIS — IMO0002 Reserved for concepts with insufficient information to code with codable children: Secondary | ICD-10-CM | POA: Insufficient documentation

## 2011-04-27 DIAGNOSIS — F319 Bipolar disorder, unspecified: Secondary | ICD-10-CM | POA: Insufficient documentation

## 2011-04-27 LAB — DIFFERENTIAL
Eosinophils Absolute: 0.4 10*3/uL (ref 0.0–0.7)
Eosinophils Relative: 4 % (ref 0–5)
Lymphs Abs: 3.1 10*3/uL (ref 0.7–4.0)
Monocytes Absolute: 0.6 10*3/uL (ref 0.1–1.0)

## 2011-04-27 LAB — CBC
HCT: 47 % (ref 39.0–52.0)
MCV: 96 fL (ref 78.0–100.0)
Platelets: 284 10*3/uL (ref 150–400)
RDW: 12.4 % (ref 11.5–15.5)
WBC: 8.8 10*3/uL (ref 4.0–10.5)

## 2011-04-27 LAB — URINALYSIS, ROUTINE W REFLEX MICROSCOPIC
Glucose, UA: NEGATIVE mg/dL
Hgb urine dipstick: NEGATIVE
Ketones, ur: 15 mg/dL — AB
Nitrite: NEGATIVE
Specific Gravity, Urine: 1.025 (ref 1.005–1.030)
pH: 7 (ref 5.0–8.0)

## 2011-04-27 LAB — COMPREHENSIVE METABOLIC PANEL
ALT: 16 U/L (ref 0–53)
AST: 22 U/L (ref 0–37)
Albumin: 3.9 g/dL (ref 3.5–5.2)
CO2: 27 mEq/L (ref 19–32)
Calcium: 9.5 mg/dL (ref 8.4–10.5)
Chloride: 109 mEq/L (ref 96–112)
GFR calc Af Amer: >60 mL/min (ref >60)
GFR calc non Af Amer: >60 mL/min (ref >60)
Sodium: 142 mEq/L (ref 135–145)

## 2011-04-27 LAB — URINE MICROSCOPIC-ADD ON

## 2011-04-27 LAB — LIPASE, BLOOD: Lipase: 26 U/L (ref 11–59)

## 2011-05-13 ENCOUNTER — Emergency Department (HOSPITAL_COMMUNITY)
Admission: EM | Admit: 2011-05-13 | Discharge: 2011-05-14 | Disposition: A | Discharge status: Home / Self Care | Payer: Self-pay | Attending: Emergency Medicine | Admitting: Emergency Medicine

## 2011-05-13 DIAGNOSIS — F4481 Dissociative identity disorder: Secondary | ICD-10-CM | POA: Insufficient documentation

## 2011-05-13 DIAGNOSIS — F3130 Bipolar disorder, current episode depressed, mild or moderate severity, unspecified: Secondary | ICD-10-CM | POA: Insufficient documentation

## 2011-05-13 DIAGNOSIS — Z8659 Personal history of other mental and behavioral disorders: Secondary | ICD-10-CM | POA: Insufficient documentation

## 2011-05-13 DIAGNOSIS — S51809A Unspecified open wound of unspecified forearm, initial encounter: Secondary | ICD-10-CM | POA: Insufficient documentation

## 2011-05-13 DIAGNOSIS — X789XXA Intentional self-harm by unspecified sharp object, initial encounter: Secondary | ICD-10-CM | POA: Insufficient documentation

## 2011-05-13 DIAGNOSIS — R45850 Homicidal ideations: Secondary | ICD-10-CM | POA: Insufficient documentation

## 2011-05-13 DIAGNOSIS — Y92009 Unspecified place in unspecified non-institutional (private) residence as the place of occurrence of the external cause: Secondary | ICD-10-CM | POA: Insufficient documentation

## 2011-05-13 DIAGNOSIS — F411 Generalized anxiety disorder: Secondary | ICD-10-CM | POA: Insufficient documentation

## 2011-05-13 DIAGNOSIS — F988 Other specified behavioral and emotional disorders with onset usually occurring in childhood and adolescence: Secondary | ICD-10-CM | POA: Insufficient documentation

## 2011-05-13 LAB — CBC
HCT: 41.5 % (ref 39.0–52.0)
Hemoglobin: 14.6 g/dL (ref 13.0–17.0)
MCV: 92.8 fL (ref 78.0–100.0)
RBC: 4.47 MIL/uL (ref 4.22–5.81)
RDW: 12.1 % (ref 11.5–15.5)
WBC: 9.6 10*3/uL (ref 4.0–10.5)

## 2011-05-13 LAB — DIFFERENTIAL
Basophils Absolute: 0 K/uL (ref 0.0–0.1)
Basophils Relative: 0 % (ref 0–1)
Eosinophils Absolute: 0.3 K/uL (ref 0.0–0.7)
Eosinophils Relative: 3 % (ref 0–5)
Lymphocytes Relative: 27 % (ref 12–46)
Lymphs Abs: 2.5 K/uL (ref 0.7–4.0)
Monocytes Absolute: 0.7 K/uL (ref 0.1–1.0)
Monocytes Relative: 7 % (ref 3–12)
Neutro Abs: 6.1 K/uL (ref 1.7–7.7)
Neutrophils Relative %: 63 % (ref 43–77)

## 2011-05-14 LAB — COMPREHENSIVE METABOLIC PANEL
Albumin: 3.8 g/dL (ref 3.5–5.2)
Alkaline Phosphatase: 92 U/L (ref 39–117)
BUN: 5 mg/dL — ABNORMAL LOW (ref 6–23)
CO2: 19 mEq/L (ref 19–32)
Chloride: 103 mEq/L (ref 96–112)
Creatinine, Ser: 0.97 mg/dL (ref 0.50–1.35)
GFR calc non Af Amer: >90 mL/min (ref >90)
Potassium: 2.6 mEq/L — CL (ref 3.5–5.1)
Total Bilirubin: 0.1 mg/dL — ABNORMAL LOW (ref 0.3–1.2)

## 2011-05-14 LAB — RAPID URINE DRUG SCREEN, HOSP PERFORMED
Amphetamines: NOT DETECTED
Opiates: NOT DETECTED
Tetrahydrocannabinol: POSITIVE — AB

## 2011-05-14 LAB — ETHANOL: Alcohol, Ethyl (B): 87 mg/dL — ABNORMAL HIGH (ref 0–11)

## 2011-07-22 ENCOUNTER — Emergency Department (HOSPITAL_COMMUNITY)
Admission: EM | Admit: 2011-07-22 | Discharge: 2011-07-22 | Disposition: A | Discharge status: Home / Self Care | Payer: Self-pay | Attending: Emergency Medicine | Admitting: Emergency Medicine

## 2011-07-22 ENCOUNTER — Emergency Department (HOSPITAL_COMMUNITY): Payer: Self-pay

## 2011-07-22 ENCOUNTER — Encounter: Payer: Self-pay | Admitting: *Deleted

## 2011-07-22 DIAGNOSIS — J40 Bronchitis, not specified as acute or chronic: Secondary | ICD-10-CM | POA: Insufficient documentation

## 2011-07-22 DIAGNOSIS — F17200 Nicotine dependence, unspecified, uncomplicated: Secondary | ICD-10-CM | POA: Insufficient documentation

## 2011-07-22 DIAGNOSIS — K5090 Crohn's disease, unspecified, without complications: Secondary | ICD-10-CM | POA: Insufficient documentation

## 2011-07-22 DIAGNOSIS — J3489 Other specified disorders of nose and nasal sinuses: Secondary | ICD-10-CM | POA: Insufficient documentation

## 2011-07-22 DIAGNOSIS — R0602 Shortness of breath: Secondary | ICD-10-CM | POA: Insufficient documentation

## 2011-07-22 DIAGNOSIS — R05 Cough: Secondary | ICD-10-CM | POA: Insufficient documentation

## 2011-07-22 DIAGNOSIS — R079 Chest pain, unspecified: Secondary | ICD-10-CM | POA: Insufficient documentation

## 2011-07-22 DIAGNOSIS — R059 Cough, unspecified: Secondary | ICD-10-CM | POA: Insufficient documentation

## 2011-07-22 DIAGNOSIS — R093 Abnormal sputum: Secondary | ICD-10-CM | POA: Insufficient documentation

## 2011-07-22 HISTORY — DX: Crohn's disease, unspecified, without complications: K50.90

## 2011-07-22 MED ORDER — ALBUTEROL SULFATE HFA 108 (90 BASE) MCG/ACT IN AERS
2.0000 | INHALATION_SPRAY | Freq: Four times a day (QID) | RESPIRATORY_TRACT | Status: DC
  Administered 2011-07-22: 2 via RESPIRATORY_TRACT
  Filled 2011-07-22: qty 6.7

## 2011-07-22 MED ORDER — AZITHROMYCIN 250 MG PO TABS: ORAL_TABLET | ORAL | Status: AC

## 2011-07-22 MED ORDER — ALBUTEROL SULFATE (5 MG/ML) 0.5% IN NEBU
5.0000 mg | INHALATION_SOLUTION | RESPIRATORY_TRACT | Status: AC
  Administered 2011-07-22: 5 mg via RESPIRATORY_TRACT
  Filled 2011-07-22: qty 1

## 2011-07-22 MED ORDER — HYDROCOD POLST-CHLORPHEN POLST 10-8 MG/5ML PO LQCR: 5.0000 mL | Freq: Every evening | ORAL | Status: DC | PRN

## 2011-07-22 MED ORDER — BENZONATATE 100 MG PO CAPS: 100.0000 mg | ORAL_CAPSULE | Freq: Three times a day (TID) | ORAL | Status: AC | PRN

## 2011-07-22 MED ORDER — ALBUTEROL SULFATE HFA 108 (90 BASE) MCG/ACT IN AERS: 2.0000 | INHALATION_SPRAY | RESPIRATORY_TRACT | Status: DC | PRN

## 2011-07-22 MED ORDER — IPRATROPIUM BROMIDE 0.02 % IN SOLN
0.5000 mg | Freq: Once | RESPIRATORY_TRACT | Status: AC
  Administered 2011-07-22: 0.5 mg via RESPIRATORY_TRACT
  Filled 2011-07-22: qty 2.5

## 2011-07-22 MED ORDER — HYDROCOD POLST-CHLORPHEN POLST 10-8 MG/5ML PO LQCR
5.0000 mL | Freq: Once | ORAL | Status: AC
  Administered 2011-07-22: 5 mL via ORAL
  Filled 2011-07-22: qty 5

## 2011-07-22 NOTE — ED Notes (Signed)
Pt given antibiotics and 3 days of cough medication from case management.

## 2011-07-22 NOTE — ED Provider Notes (Signed)
History     CSN: [id number]  Arrival date & time 07/22/11  1033   First MD Initiated Contact with Patient 07/22/11 1130      Chief Complaint  Patient presents with  . Cough    (Consider location/radiation/quality/duration/timing/severity/associated sxs/prior treatment) HPI Comments: Patient reports cough productive of yellow and green sputum.  Worse with laying flat.  SOB only with coughing.  Soreness in chest and throat from coughing so much.  Also has rhinorrhea.  Pt is a smoker.    Patient is a 30 y.o. male presenting with cough. The history is provided by the patient.  Cough    Past Medical History  Diagnosis Date  . Crohn disease     Past Surgical History  Procedure Date  . Colon surgery     History reviewed. No pertinent family history.  History  Substance Use Topics  . Smoking status: Current Everyday Smoker -- 0.5 packs/day    Types: Cigarettes  . Smokeless tobacco: Never Used  . Alcohol Use: Yes     occsionally      Review of Systems  Respiratory: Positive for cough.   All other systems reviewed and are negative.    Allergies  Benadryl allergy and Penicillins  Home Medications   Current Outpatient Rx  Name Route Sig Dispense Refill  . ASPIRIN-CAFFEINE 845-65 MG PO PACK Oral Take 1 packet by mouth daily as needed. For headache     . GUAIFENESIN 100 MG/5ML PO SOLN Oral Take 5 mLs by mouth every 4 (four) hours as needed. For cough/congestion       BP 120/75  Pulse 74  Temp(Src) 97.7 F (36.5 C) (Oral)  Resp 16  Ht 5' 5"  (1.651 m)  Wt 120 lb (54.432 kg)  BMI 19.97 kg/m2  SpO2 98%  Physical Exam  Constitutional: He is oriented to person, place, and time. He appears well-developed and well-nourished.  HENT:  Head: Normocephalic and atraumatic.  Neck: Neck supple.  Pulmonary/Chest: Effort normal. No respiratory distress. He has no decreased breath sounds. He has wheezes. He has no rhonchi. He has no rales. He exhibits no tenderness.     Slight end expiratory wheezes diffusely  Neurological: He is alert and oriented to person, place, and time.    ED Course  Procedures (including critical care time)  12:33 PM Increased wheezing following first neb treatment.  Family feels strongly that patient needs chest xray, that he doesn't have bronchitis.  Though I was planning to give z-pak regardless due to length of infection and status as smoker, will order chest xray.  I have also spoken with case manager as patient requests to have all of his medications for free.    2:09 PM Case manager has not been to ED yet to work on prescriptions for patient.  Have repaged.    2:25 PM States she is on her way, had other patient to see first.  Will be in ED within 10-15 minutes.    Labs Reviewed - No data to display Dg Chest 2 View  07/22/2011  *RADIOLOGY REPORT*  Clinical Data: Cough and congestion  CHEST - 2 VIEW  Comparison: None  Findings: Normal heart, mediastinal, and hilar contours.  Lungs are well expanded and clear. Negative for airspace disease or pleural effusion.  No acute bony abnormality.  IMPRESSION: No acute cardiopulmonary disease.  Original Report Authenticated By: Curlene Dolphin, M.D.     1. Bronchitis       MDM  Patient in CDU holding  for medications from pharmacy.  Has been seen by case manager, awaiting prescriptions.  Breathing much better after nebulizer treatment.  Discussed patient with Barton Dubois, PA-C, who assumes care of patient at change of shift.          Otis Brace, [place] 07/22/11 1534

## 2011-07-22 NOTE — ED Notes (Signed)
Pt c/o congestion, prod cough - yellow or dark green x 1 week.  States is worse when lying down at night.  Denies fever.

## 2011-07-22 NOTE — ED Provider Notes (Signed)
Medical screening examination/treatment/procedure(s) were performed by non-physician practitioner and as supervising physician I was immediately available for consultation/collaboration.  Virgel Manifold, MD 07/22/11 2047

## 2011-07-22 NOTE — ED Notes (Signed)
Pt waiting on case management consult.

## 2011-07-22 NOTE — Progress Notes (Signed)
CARE MANAGEMENT NOTE 07/22/2011  Patient:  [name], [name]   Account Number:  [id number]  Date Initiated:  07/22/2011  Documentation initiated by:  [facility]  Subjective/Objective Assessment:   cold, fever, cough     Action/Plan:   currently unemployed   Anticipated DC Date:  07/22/2011   Anticipated DC Plan:  [place]  CM consult  Medication Assistance      Choice offered to / List presented to:             Status of service:  Completed, signed off Medicare Important Message given?   (If response is "NO", the following Medicare IM given date fields will be blank) Date Medicare IM given:   Date Additional Medicare IM given:    Discharge Disposition:  HOME/SELF CARE  Per UR Regulation:    Comments:  07/22/2011 1430 Contacted main pharmacy and pt is eligible for ZZ fund. Explained to pt that fund can be used once per year. States he is currently unemployed. He does follow up with community clinic in the past for healthcare and medication needs. Jonnie Finner RN CCM Case Mgmt phone [phone]

## 2011-09-03 ENCOUNTER — Encounter (HOSPITAL_COMMUNITY): Payer: Self-pay

## 2011-09-03 ENCOUNTER — Inpatient Hospital Stay (HOSPITAL_COMMUNITY)
Admission: EM | Admit: 2011-09-03 | Discharge: 2011-09-04 | DRG: 387 | Discharge status: Against Medical Advice | Payer: Self-pay | Attending: Internal Medicine | Admitting: Internal Medicine

## 2011-09-03 DIAGNOSIS — K5090 Crohn's disease, unspecified, without complications: Secondary | ICD-10-CM | POA: Diagnosis present

## 2011-09-03 DIAGNOSIS — K920 Hematemesis: Secondary | ICD-10-CM | POA: Diagnosis present

## 2011-09-03 DIAGNOSIS — F17200 Nicotine dependence, unspecified, uncomplicated: Secondary | ICD-10-CM | POA: Diagnosis present

## 2011-09-03 DIAGNOSIS — D649 Anemia, unspecified: Secondary | ICD-10-CM | POA: Diagnosis present

## 2011-09-03 DIAGNOSIS — R1013 Epigastric pain: Secondary | ICD-10-CM | POA: Diagnosis present

## 2011-09-03 DIAGNOSIS — Z9189 Other specified personal risk factors, not elsewhere classified: Secondary | ICD-10-CM

## 2011-09-03 DIAGNOSIS — K5010 Crohn's disease of large intestine without complications: Principal | ICD-10-CM | POA: Diagnosis present

## 2011-09-03 DIAGNOSIS — F319 Bipolar disorder, unspecified: Secondary | ICD-10-CM | POA: Diagnosis present

## 2011-09-03 DIAGNOSIS — F909 Attention-deficit hyperactivity disorder, unspecified type: Secondary | ICD-10-CM | POA: Diagnosis present

## 2011-09-03 DIAGNOSIS — Z9049 Acquired absence of other specified parts of digestive tract: Secondary | ICD-10-CM

## 2011-09-03 HISTORY — DX: Major depressive disorder, single episode, unspecified: F32.9

## 2011-09-03 HISTORY — DX: Other psychoactive substance abuse, uncomplicated: F19.10

## 2011-09-03 HISTORY — DX: Bipolar disorder, unspecified: F31.9

## 2011-09-03 HISTORY — DX: Depression, unspecified: F32.A

## 2011-09-03 HISTORY — DX: Anxiety disorder, unspecified: F41.9

## 2011-09-03 LAB — DIFFERENTIAL
Basophils Relative: 0 % (ref 0–1)
Eosinophils Relative: 2 % (ref 0–5)
Monocytes Relative: 8 % (ref 3–12)
Neutrophils Relative %: 61 % (ref 43–77)

## 2011-09-03 LAB — CBC
Hemoglobin: 15.9 g/dL (ref 13.0–17.0)
MCH: 34 pg (ref 26.0–34.0)
MCV: 92.9 fL (ref 78.0–100.0)
Platelets: 255 10*3/uL (ref 150–400)
RBC: 4.67 MIL/uL (ref 4.22–5.81)
WBC: 9.2 10*3/uL (ref 4.0–10.5)

## 2011-09-03 LAB — BASIC METABOLIC PANEL
CO2: 27 mEq/L (ref 19–32)
Chloride: 104 mEq/L (ref 96–112)
Glucose, Bld: 93 mg/dL (ref 70–99)
Potassium: 3.6 mEq/L (ref 3.5–5.1)
Sodium: 140 mEq/L (ref 135–145)

## 2011-09-03 MED ORDER — HYDROMORPHONE HCL PF 1 MG/ML IJ SOLN
1.0000 mg | Freq: Once | INTRAMUSCULAR | Status: AC
  Administered 2011-09-03: 1 mg via INTRAVENOUS
  Filled 2011-09-03: qty 1

## 2011-09-03 MED ORDER — SODIUM CHLORIDE 0.9 % IV SOLN
INTRAVENOUS | Status: AC
  Administered 2011-09-04: 01:00:00 via INTRAVENOUS

## 2011-09-03 MED ORDER — SODIUM CHLORIDE 0.9 % IV BOLUS (SEPSIS)
1000.0000 mL | Freq: Once | INTRAVENOUS | Status: AC
  Administered 2011-09-03: 1000 mL via INTRAVENOUS

## 2011-09-03 MED ORDER — ONDANSETRON HCL 4 MG/2ML IJ SOLN
4.0000 mg | Freq: Four times a day (QID) | INTRAMUSCULAR | Status: DC | PRN
  Administered 2011-09-03: 4 mg via INTRAVENOUS
  Filled 2011-09-03: qty 2

## 2011-09-03 MED ORDER — PANTOPRAZOLE SODIUM 40 MG IV SOLR
40.0000 mg | Freq: Once | INTRAVENOUS | Status: AC
  Administered 2011-09-03: 40 mg via INTRAVENOUS
  Filled 2011-09-03: qty 40

## 2011-09-03 NOTE — ED Provider Notes (Signed)
History     CSN: [id number]  Arrival date & time 09/03/11  2011   First MD Initiated Contact with Patient 09/03/11 2226      Chief Complaint  Patient presents with  . Abdominal Pain    (Consider location/radiation/quality/duration/timing/severity/associated sxs/prior treatment) HPI Patient is a 31 year old man with history of crohn's disease s/p colonic resection in 2007 who presents with 3-4 days of throbbing lower abdominal pain 'like someone kicked me' and 8-9 total episodes of bloody emesis this evening. He reports he walked to the bathroom to relieve himself and then suddenly felt dizzy and sweaty and began vomiting bright red blood. His vomitus then became maroon colored clots as he continued to vomit. He was brought via ambulance and had 1 episode of vomiting on the ambulance and several more in the waiting room in the ED. He reports filling a garbage can several inches full of bloody vomitus while in the waiting room. He has never had bloody vomiting in the past. He denies any dizziness or chest pain. He denies any regular alcohol use or aspirin/NSAID use. He takes a BC powder occasionally for headache. He was not on any preventative treatment for his Crohn's due to very high monthly cost. He has had only 2 flares in the past 6 years since his surgery. He reports his BM have been dark yesterday with a dark brown/black coloring. Denies any hematochezia.   Past Medical History  Diagnosis Date  . Crohn disease     Past Surgical History  Procedure Date  . Colon surgery     History reviewed. No pertinent family history.  History  Substance Use Topics  . Smoking status: Current Everyday Smoker -- 0.5 packs/day    Types: Cigarettes  . Smokeless tobacco: Never Used  . Alcohol Use: Yes     occsionally      Review of Systems  Allergies  Benadryl allergy and Penicillins  Home Medications   Current Outpatient Rx  Name Route Sig Dispense Refill  . ALBUTEROL SULFATE HFA  108 (90 BASE) MCG/ACT IN AERS Inhalation Inhale 2 puffs into the lungs every 4 (four) hours as needed for wheezing or shortness of breath (cough). 1 Inhaler 0    BP 118/84  Pulse 102  Temp(Src) 98.9 F (37.2 C) (Oral)  Resp 20  SpO2 97%  Physical Exam  ED Course  Procedures (including critical care time)   Labs Reviewed  CBC  DIFFERENTIAL  BASIC METABOLIC PANEL   No results found.   No diagnosis found.    MDM  Type and cross 2 units, ordered cbc, bmet. Hemoccult negative. Will contact Dr. Maurene Capes, his gastroenterologist. [facility] will come evaluated him tomorrow morning. Requested admission for internal medicine. Started treatment with zofran, IV PPI, dilaudid and 1L normal saline.        Hans Eden, MD 09/03/11 [place], MD 09/03/11 2330

## 2011-09-03 NOTE — ED Notes (Signed)
Pt complains of vomiting blood and severe abd pain that started a few days ago, he states that hes been throwing up blood this afternoon

## 2011-09-04 ENCOUNTER — Encounter (HOSPITAL_COMMUNITY)
Admission: EM | Discharge status: Against Medical Advice | Payer: Self-pay | Source: Home / Self Care | Attending: Internal Medicine

## 2011-09-04 ENCOUNTER — Encounter (HOSPITAL_COMMUNITY): Payer: Self-pay | Admitting: Internal Medicine

## 2011-09-04 ENCOUNTER — Encounter (HOSPITAL_COMMUNITY): Payer: Self-pay | Admitting: Emergency Medicine

## 2011-09-04 ENCOUNTER — Inpatient Hospital Stay (HOSPITAL_COMMUNITY)
Admission: EM | Admit: 2011-09-04 | Discharge: 2011-09-06 | DRG: 378 | Disposition: A | Discharge status: Home / Self Care | Payer: Self-pay | Attending: Internal Medicine | Admitting: Internal Medicine

## 2011-09-04 DIAGNOSIS — K2980 Duodenitis without bleeding: Secondary | ICD-10-CM | POA: Diagnosis present

## 2011-09-04 DIAGNOSIS — Z9119 Patient's noncompliance with other medical treatment and regimen: Secondary | ICD-10-CM

## 2011-09-04 DIAGNOSIS — K5090 Crohn's disease, unspecified, without complications: Secondary | ICD-10-CM | POA: Diagnosis present

## 2011-09-04 DIAGNOSIS — D649 Anemia, unspecified: Secondary | ICD-10-CM | POA: Diagnosis present

## 2011-09-04 DIAGNOSIS — K920 Hematemesis: Secondary | ICD-10-CM | POA: Diagnosis present

## 2011-09-04 DIAGNOSIS — Z9049 Acquired absence of other specified parts of digestive tract: Secondary | ICD-10-CM

## 2011-09-04 DIAGNOSIS — Z91199 Patient's noncompliance with other medical treatment and regimen due to unspecified reason: Secondary | ICD-10-CM

## 2011-09-04 DIAGNOSIS — K449 Diaphragmatic hernia without obstruction or gangrene: Secondary | ICD-10-CM | POA: Diagnosis present

## 2011-09-04 DIAGNOSIS — F909 Attention-deficit hyperactivity disorder, unspecified type: Secondary | ICD-10-CM | POA: Diagnosis present

## 2011-09-04 DIAGNOSIS — F17200 Nicotine dependence, unspecified, uncomplicated: Secondary | ICD-10-CM | POA: Diagnosis present

## 2011-09-04 DIAGNOSIS — R109 Unspecified abdominal pain: Secondary | ICD-10-CM

## 2011-09-04 DIAGNOSIS — F1411 Cocaine abuse, in remission: Secondary | ICD-10-CM | POA: Diagnosis present

## 2011-09-04 DIAGNOSIS — F319 Bipolar disorder, unspecified: Secondary | ICD-10-CM | POA: Diagnosis present

## 2011-09-04 DIAGNOSIS — R1013 Epigastric pain: Secondary | ICD-10-CM | POA: Diagnosis present

## 2011-09-04 DIAGNOSIS — Z9189 Other specified personal risk factors, not elsewhere classified: Secondary | ICD-10-CM

## 2011-09-04 HISTORY — PX: ESOPHAGOGASTRODUODENOSCOPY: SHX5428

## 2011-09-04 LAB — CBC
MCH: 32.7 pg (ref 26.0–34.0)
MCH: 33.5 pg (ref 26.0–34.0)
MCHC: 35 g/dL (ref 30.0–36.0)
MCV: 94.2 fL (ref 78.0–100.0)
Platelets: 192 10*3/uL (ref 150–400)
Platelets: 191 10*3/uL (ref 150–400)
RDW: 12.3 % (ref 11.5–15.5)
RDW: 12.2 % (ref 11.5–15.5)

## 2011-09-04 LAB — RAPID URINE DRUG SCREEN, HOSP PERFORMED
Barbiturates: NOT DETECTED
Cocaine: POSITIVE — AB

## 2011-09-04 LAB — OCCULT BLOOD X 1 CARD TO LAB, STOOL: Fecal Occult Bld: NEGATIVE

## 2011-09-04 SURGERY — EGD (ESOPHAGOGASTRODUODENOSCOPY)
Anesthesia: Moderate Sedation

## 2011-09-04 MED ORDER — ONDANSETRON HCL 4 MG PO TABS: 4.0000 mg | ORAL_TABLET | Freq: Four times a day (QID) | ORAL | Status: DC | PRN

## 2011-09-04 MED ORDER — ACETAMINOPHEN 650 MG RE SUPP: 650.0000 mg | Freq: Four times a day (QID) | RECTAL | Status: DC | PRN

## 2011-09-04 MED ORDER — PANTOPRAZOLE SODIUM 40 MG IV SOLR
40.0000 mg | Freq: Two times a day (BID) | INTRAVENOUS | Status: DC
  Administered 2011-09-04: 40 mg via INTRAVENOUS
  Filled 2011-09-04 (×4): qty 40

## 2011-09-04 MED ORDER — ALUM & MAG HYDROXIDE-SIMETH 200-200-20 MG/5ML PO SUSP: 30.0000 mL | Freq: Four times a day (QID) | ORAL | Status: DC | PRN

## 2011-09-04 MED ORDER — ACETAMINOPHEN 325 MG PO TABS
650.0000 mg | ORAL_TABLET | Freq: Once | ORAL | Status: AC
  Administered 2011-09-04: 650 mg via ORAL
  Filled 2011-09-04: qty 2

## 2011-09-04 MED ORDER — LORAZEPAM 0.5 MG PO TABS
0.2500 mg | ORAL_TABLET | Freq: Two times a day (BID) | ORAL | Status: DC | PRN
  Administered 2011-09-04: 0.25 mg via ORAL
  Filled 2011-09-04 (×2): qty 1

## 2011-09-04 MED ORDER — ONDANSETRON HCL 4 MG/2ML IJ SOLN: 4.0000 mg | Freq: Four times a day (QID) | INTRAMUSCULAR | Status: DC | PRN

## 2011-09-04 MED ORDER — MORPHINE SULFATE 2 MG/ML IJ SOLN
1.0000 mg | Freq: Once | INTRAMUSCULAR | Status: AC
  Administered 2011-09-04: 1 mg via INTRAVENOUS

## 2011-09-04 MED ORDER — SODIUM CHLORIDE 0.9 % IV SOLN
INTRAVENOUS | Status: DC
  Administered 2011-09-04 (×2): via INTRAVENOUS

## 2011-09-04 MED ORDER — MORPHINE SULFATE 2 MG/ML IJ SOLN
2.0000 mg | INTRAMUSCULAR | Status: DC | PRN
  Administered 2011-09-04: 2 mg via INTRAVENOUS
  Filled 2011-09-04: qty 1

## 2011-09-04 MED ORDER — HYDROMORPHONE HCL PF 1 MG/ML IJ SOLN
0.5000 mg | INTRAMUSCULAR | Status: DC | PRN
  Administered 2011-09-04 (×2): 0.5 mg via INTRAVENOUS
  Filled 2011-09-04 (×2): qty 1

## 2011-09-04 MED ORDER — BUTAMBEN-TETRACAINE-BENZOCAINE 2-2-14 % EX AERO
INHALATION_SPRAY | CUTANEOUS | Status: DC | PRN
  Administered 2011-09-04: 2 via TOPICAL

## 2011-09-04 MED ORDER — FENTANYL CITRATE 0.05 MG/ML IJ SOLN
INTRAMUSCULAR | Status: AC
  Filled 2011-09-04: qty 4

## 2011-09-04 MED ORDER — GI COCKTAIL ~~LOC~~
30.0000 mL | Freq: Once | ORAL | Status: AC
  Administered 2011-09-04: 30 mL via ORAL
  Filled 2011-09-04: qty 30

## 2011-09-04 MED ORDER — MIDAZOLAM HCL 10 MG/2ML IJ SOLN
INTRAMUSCULAR | Status: DC | PRN
  Administered 2011-09-04: 2 mg via INTRAVENOUS
  Administered 2011-09-04: 3 mg via INTRAVENOUS

## 2011-09-04 MED ORDER — MORPHINE SULFATE 2 MG/ML IJ SOLN
INTRAMUSCULAR | Status: AC
  Administered 2011-09-04: 1 mg via INTRAVENOUS
  Filled 2011-09-04: qty 1

## 2011-09-04 MED ORDER — ACETAMINOPHEN 325 MG PO TABS: 650.0000 mg | ORAL_TABLET | Freq: Four times a day (QID) | ORAL | Status: DC | PRN

## 2011-09-04 MED ORDER — MIDAZOLAM HCL 10 MG/2ML IJ SOLN
INTRAMUSCULAR | Status: AC
  Filled 2011-09-04: qty 4

## 2011-09-04 MED ORDER — FENTANYL NICU IV SYRINGE 50 MCG/ML
INJECTION | INTRAMUSCULAR | Status: DC | PRN
  Administered 2011-09-04: 25 ug via INTRAVENOUS
  Administered 2011-09-04: 50 ug via INTRAVENOUS

## 2011-09-04 NOTE — Progress Notes (Addendum)
INITIAL ADULT NUTRITION ASSESSMENT Date: 09/04/2011   Time: 10:31 AM Reason for Assessment: Nutrition risk   ASSESSMENT: Male 31 y.o.  Dx: Hematemesis  Hx: Past Medical History  Diagnosis Date  . Crohn disease Dx 2004    terminal ileum Crohn's s/p ileocolectomy with anastamosis  . Bipolar 1 disorder     Self laceration and self harm behaviors   . Substance abuse    Related Meds:  Scheduled Meds:   . sodium chloride   Intravenous STAT  . acetaminophen  650 mg Oral Once  . gi cocktail  30 mL Oral Once  .  HYDROmorphone (DILAUDID) injection  1 mg Intravenous Once  .  morphine injection  1 mg Intravenous Once  . pantoprazole (PROTONIX) IV  40 mg Intravenous Once  . pantoprazole (PROTONIX) IV  40 mg Intravenous Q12H  . sodium chloride  1,000 mL Intravenous Once   Continuous Infusions:   . sodium chloride 100 mL/hr at 09/04/11 1026   PRN Meds:.acetaminophen, acetaminophen, alum & mag hydroxide-simeth, HYDROmorphone (DILAUDID) injection, morphine, ondansetron (ZOFRAN) IV, ondansetron, DISCONTD: ondansetron (ZOFRAN) IV  Ht: 5' 5"  (165.1 cm)  Wt: 104 lb (47.174 kg)  Ideal Wt: 56.8kg % Ideal Wt: 83  Usual Wt: 51.8kg % Usual Wt: 91  Body mass index is 17.31 kg/(m^2).  Food/Nutrition Related Hx: Pt reports vomiting blood with severe abdominal pain for the past 4-5 days. Pt with history of Crohn's disease. Pt states he was not following any specific diet at home. Pt reports sometimes food feels like it stuck in his stomach. Pt reports unintentional weight loss of 10 pounds in the past week. Pt denies any vomiting today but states he has had a little bit of nausea. Pt scheduled to have upper endoscopy today.  Labs:  CMP     Component Value Date/Time   NA 140 09/03/2011 2300   K 3.6 09/03/2011 2300   CL 104 09/03/2011 2300   CO2 27 09/03/2011 2300   GLUCOSE 93 09/03/2011 2300   BUN 10 09/03/2011 2300   CREATININE 0.98 09/03/2011 2300   CALCIUM 9.7 09/03/2011 2300   PROT 6.7  05/13/2011 2325   ALBUMIN 3.8 05/13/2011 2325   AST 18 05/13/2011 2325   ALT 12 05/13/2011 2325   ALKPHOS 92 05/13/2011 2325   BILITOT 0.1* 05/13/2011 2325   GFRNONAA >90 09/03/2011 2300   GFRAA >90 09/03/2011 2300   No intake or output data in the 24 hours ending 09/04/11 1036  Diet Order: NPO    IVF:    sodium chloride Last Rate: 100 mL/hr at 09/04/11 1026    Estimated Nutritional Needs:   Kcal:1650-1900 Protein:70-85g Fluid:1.6-1.9L  NUTRITION DIAGNOSIS: -Inadequate oral intake (NI-2.1).  Status: Ongoing -Pt meets criteria for severe PCM of acute illness AEB <50% energy intake in the past week with 8.7% weight loss  RELATED TO: abdominal pain, vomiting  AS EVIDENCE BY: pt statement, NPO, awaiting endoscopy   MONITORING/EVALUATION(Goals): Advance diet as tolerated to low fiber diet if pt found to have Crohn's disease flare up.   EDUCATION NEEDS: -No education needs identified at this time  INTERVENTION: Await results of upper endoscopy. Diet advancement per MD. Will monitor.   Dietitian #: [phone]  Ravanna Per approved criteria  -Severe malnutrition in the context of acute illness or injury    Glory Rosebush 09/04/2011, 10:31 AM

## 2011-09-04 NOTE — ED Notes (Signed)
Pt states he was admitted into the hospital last night for vomiting blood and had an endoscopy procedure today  Pt states he signed himself out AMA today but wants to be readmitted tonight for same  Pt states he had vomiting outside the hospital before coming in and it still has blood noted in it

## 2011-09-04 NOTE — ED Provider Notes (Signed)
I saw and evaluated the patient, reviewed the resident's note and I agree with the findings and plan.   .Face to face Exam:  General:  Awake HEENT:  Atraumatic Resp:  Normal effort Abd:  Nondistended Neuro:No focal weakness Lymph: No adenopathy   Dot Lanes, MD 09/04/11 2243

## 2011-09-04 NOTE — ED Notes (Signed)
Pt states he was on the 3rd left ama. Pt states he feels like he made a mistake and wants to be re seen

## 2011-09-04 NOTE — Progress Notes (Signed)
Pt requesting that his diet be advanced. MD notified. MD on call states for patient to remain on clears and he is still requesting IV pain meds at this time. Pt very upset and states he is going to eat whatever he wants to. Will continue to monitor

## 2011-09-04 NOTE — H&P (Signed)
PCP:   William Hamburger, MD, MD   Chief Complaint:  Hematemesis  HPI: 63yoM with h/o terminal ileum Crohn's, diagnosed 2004, s/p ileocolectomy with  anastamosis, psych history with depression/agitation, self laceration, substance abuse,  presents with hematemesis.   Pt has been in usual state of health including baseline minimal abd pain, until about 4d  ago when he states his abdominal pain had gotten worse, possibly localized the  epigastrium, possibly worse with eating. However, he does clearly state that he gets a  sensation of food getting stuck in his stomach when eating. He also states that this  doesn't feel like his Crohn's pain, which seems to not have been active as of late.    Earlier today, he vomitied and states it was pure bright red blood, and he was vomiting  for about 15 mins, and after it then started getting darker red and having marroon clots  in it, for which he presented to the ED. There has been no issues with his bowel  movements, no h/o diarrhea, BRBPR, or melena recently.   In the ED vitals were stable. His initial Hct was completely normal, 43, as was the rest  of his w/u. His fecal occult blood was also negative. Dr. Maurene Capes in GI was called and  agreed to see the patient. He has been given tylenol, dilaudid, morphine, zofran, 40 mg  IV protonix, and 1L NS and maintenance IVF's.   He is currently in abdominal pain but has no other complaints. ROS o/w negative.   Past Medical History  Diagnosis Date  . Crohn disease Dx 2004    terminal ileum Crohn's s/p ileocolectomy with anastamosis  . Bipolar 1 disorder     Self laceration and self harm behaviors   . Substance abuse     Past Surgical History  Procedure Date  . Colon surgery   . Inguinal hernia repair     On the right and the left     Medications:  HOME MEDS: Also states he takes Adderall Prior to Admission medications   Medication Sig Start Date End Date Taking? Authorizing Provider    albuterol (PROVENTIL HFA;VENTOLIN HFA) 108 (90 BASE) MCG/ACT inhaler Inhale 2 puffs into the lungs every 4 (four) hours as needed for wheezing or shortness of breath (cough). 07/22/11 07/21/12 Yes Otis Brace, PA    Allergies:  Allergies  Allergen Reactions  . Benadryl Allergy   . Penicillins     Social History:   reports that he has been smoking Cigarettes.  He has been smoking about .5 packs per day. He has never used smokeless tobacco. He reports that he drinks alcohol. He reports that he uses illicit drugs (Marijuana) about twice per week.   Family History: History reviewed. No pertinent family history.  Physical Exam: Filed Vitals:   09/03/11 2022 09/03/11 2254 09/04/11 0034  BP: 118/84 117/70 109/65  Pulse: 102 69 63  Temp: 98.9 F (37.2 C) 98.1 F (36.7 C) 98.4 F (36.9 C)  TempSrc: Oral Oral Oral  Resp: 20 17 18   SpO2: 97% 99% 99%   Blood pressure 109/65, pulse 63, temperature 98.4 F (36.9 C), temperature source Oral, resp. rate 18, SpO2 99.00%.  Gen: Thin, young M in no distress, appears well and not ill, fiance at bedside, able to  relate history well without problems.  HEENT: Pupils round, reactive, EOMI, Sclera clear, normal. Mouth moist, normal appearing.  Lungs: CTAB no w/c/r, normal exam  Heart: Regular, not tachycardic, normal exam without  m/g Abd: Scaphoid, soft, NT ND, benign overall but endorses pain to palpation and has minimal  facial grimacing. BS+ Extrem: Thin, warm, perfusing well, no BLE edema, normal exam Neuro: Alert, attentive conversant, CN 2-12 intact, normal exam grossly non-focal.  Skin: Numerous tattoos on his skin, including back, knuckles, chest    Labs & Imaging Results for orders placed during the hospital encounter of 09/03/11 (from the past 48 hour(s))  OCCULT BLOOD, POC DEVICE     Status: Normal   Collection Time   09/03/11 10:53 PM      Component Value Range Comment   Fecal Occult Bld NEGATIVE     CBC     Status: Abnormal    Collection Time   09/03/11 11:00 PM      Component Value Range Comment   WBC 9.2  4.0 - 10.5 (K/uL)    RBC 4.67  4.22 - 5.81 (MIL/uL)    Hemoglobin 15.9  13.0 - 17.0 (g/dL)    HCT 43.4  39.0 - 52.0 (%)    MCV 92.9  78.0 - 100.0 (fL)    MCH 34.0  26.0 - 34.0 (pg)    MCHC 36.6 (*) 30.0 - 36.0 (g/dL)    RDW 12.4  11.5 - 15.5 (%)    Platelets 255  150 - 400 (K/uL)   DIFFERENTIAL     Status: Normal   Collection Time   09/03/11 11:00 PM      Component Value Range Comment   Neutrophils Relative 61  43 - 77 (%)    Lymphocytes Relative 29  12 - 46 (%)    Monocytes Relative 8  3 - 12 (%)    Eosinophils Relative 2  0 - 5 (%)    Basophils Relative 0  0 - 1 (%)    Neutro Abs 5.6  1.7 - 7.7 (K/uL)    Lymphs Abs 2.7  0.7 - 4.0 (K/uL)    Monocytes Absolute 0.7  0.1 - 1.0 (K/uL)    Eosinophils Absolute 0.2  0.0 - 0.7 (K/uL)    Basophils Absolute 0.0  0.0 - 0.1 (K/uL)    Smear Review MORPHOLOGY UNREMARKABLE     BASIC METABOLIC PANEL     Status: Normal   Collection Time   09/03/11 11:00 PM      Component Value Range Comment   Sodium 140  135 - 145 (mEq/L)    Potassium 3.6  3.5 - 5.1 (mEq/L)    Chloride 104  96 - 112 (mEq/L)    CO2 27  19 - 32 (mEq/L)    Glucose, Bld 93  70 - 99 (mg/dL)    BUN 10  6 - 23 (mg/dL)    Creatinine, Ser 0.98  0.50 - 1.35 (mg/dL)    Calcium 9.7  8.4 - 10.5 (mg/dL)    GFR calc non Af Amer >90  >90 (mL/min)    GFR calc Af Amer >90  >90 (mL/min)   URINE RAPID DRUG SCREEN (HOSP PERFORMED)     Status: Abnormal   Collection Time   09/04/11 12:44 AM      Component Value Range Comment   Opiates NONE DETECTED  NONE DETECTED     Cocaine POSITIVE (*) NONE DETECTED     Benzodiazepines NONE DETECTED  NONE DETECTED     Amphetamines POSITIVE (*) NONE DETECTED     Tetrahydrocannabinol POSITIVE (*) NONE DETECTED     Barbiturates NONE DETECTED  NONE DETECTED     No results found.  Impression Present on Admission:  .Hematemesis .CROHN'S DISEASE  81yoM with h/o terminal  ileum Crohn's, diagnosed 2004, s/p ileocolectomy with  anastamosis, psych history with depression/agitation, self laceration, substance abuse,  presents with hematemesis.  1. Hematemesis: Suspect pt may have peptic ulcer, given 3-4d of epigastric pain,  sensation of fullness, pain worse after eating. Question whether this is related to his  Crohn's disease, which can occur anywhere along the GI tracut -- however pt without  recent systemic illness, and no bloody diarrhea. No h/o retching to suggest [employer]. Denies heavy alcohol use, but does smoke 1/2 ppd at present. UTox positive for  cocaine, so question a vasoconstrictive ischemic process, but pt denies recent use,  states it was a month ago, but marijuana may be laced. Denies drinking or eating any  bright red foods.   Normal Hct at present and negative guaic suggest this may be very acute and not enough  blood to have been digested through his GI tract.   - Trend Hct's, PPI IV BID, hold on PPI drip for now, keep NPO, IVF's, pain control, GI  has been consulted (appreciated), Hpylori. Get an INR.   2. Needs medication reconciliation. Says he takes Adderall.   Regular bed, [facility] team 4 Presumed full code   Other plans as per orders.  Calianna Kim 09/04/2011, 3:53 AM

## 2011-09-04 NOTE — Progress Notes (Signed)
Patient states he would like to sign himself out, as he is tired of eating soup and needs just 1 cigarette. Nicotine patch offered again and patient declined. This RN emphasized the importance of staying in the hospital to complete his course of treatment, but patient adamant about going home and having a cigarette. AMA papers signed by patient. MD and [facility] made aware

## 2011-09-04 NOTE — Consult Note (Signed)
I have taken a history, examined the patient and reviewed the chart. I agree with the extender's note, impression and recommendations.  Ladene Artist MD [facility]

## 2011-09-04 NOTE — Consult Note (Signed)
Referring Provider: [facility] Primary Care Physician:  None Primary Gastroenterologist:  none.  Reason for Consultation:  Hematemsis  HPI: [name] is a 31 y.o. male with history of Crohn's colitis status post left colon resection 2007. He has not had regular followup since that time and is not been on maintenance medications. He does have history of bipolar disorder ADHD and chronic drug abuse.  He comes to the emergency room last night after onset of hematemesis at home. He states that over the past 4-5 days to have a throbbing epigastric pain which is very on like his previous Crohn's pain. He says he hasn't had any symptoms that concerned him for Crohn's exacerbation recently. He says also over the past week he has noticed that he has  felt as if his food may be" sticking"  with swallowing. He Does not have any regular heartburn or indigestion. He denies any regular aspirin or NSAID use though does admit to an occasional BC. He says he has not been drinking any alcohol on a regular basis. Last evening he felt nauseated and vomited without retching and the first time he vomited, he vomited bright red blood. He says he had multiple episodes of vomiting,  which became burgundy in color with chunks of clots. He has not had anymore vomiting this morning. He says his stools were not tarry at home.  On reviewing his chart his hemoglobin has gone from 15.9 on arrival in the emergency room to 12.6 this morning. His coags are normal. His drug screen is positive for cocaine, amphetamine and THC. Drug screens was in the past also been positive. I did not confronted him about the drug screen this morning   Past Medical History  Diagnosis Date  . Crohn disease Dx 2004    terminal ileum Crohn's s/p ileocolectomy with anastamosis  . Bipolar 1 disorder     Self laceration and self harm behaviors   . Substance abuse     Past Surgical History  Procedure Date  . Colon surgery   . Inguinal  hernia repair     On the right and the left     Prior to Admission medications   Medication Sig Start Date End Date Taking? Authorizing Provider  albuterol (PROVENTIL HFA;VENTOLIN HFA) 108 (90 BASE) MCG/ACT inhaler Inhale 2 puffs into the lungs every 4 (four) hours as needed for wheezing or shortness of breath (cough). 07/22/11 07/21/12 Yes Otis Brace, PA    Current Facility-Administered Medications  Medication Dose Route Frequency Provider Last Rate Last Dose  . 0.9 %  sodium chloride infusion   Intravenous STAT Dot Lanes, MD 125 mL/hr at 09/04/11 0037    . 0.9 %  sodium chloride infusion   Intravenous Continuous Ria Bush, MD 100 mL/hr at 09/04/11 0408    . acetaminophen (TYLENOL) tablet 650 mg  650 mg Oral Q6H PRN Ria Bush, MD       Or  . acetaminophen (TYLENOL) suppository 650 mg  650 mg Rectal Q6H PRN Ria Bush, MD      . acetaminophen (TYLENOL) tablet 650 mg  650 mg Oral Once Ria Bush, MD   650 mg at 09/04/11 0153  . alum & mag hydroxide-simeth (MAALOX/MYLANTA) 200-200-20 MG/5ML suspension 30 mL  30 mL Oral Q6H PRN Ria Bush, MD      . gi cocktail  30 mL Oral Once Ria Bush, MD   30 mL at 09/04/11 0153  . HYDROmorphone (DILAUDID) injection 0.5 mg  0.5 mg Intravenous Q4H PRN Novlet Kennieth Francois, MD      . HYDROmorphone (DILAUDID) injection 1 mg  1 mg Intravenous Once Hans Eden, MD   1 mg at 09/03/11 2303  . morphine 2 MG/ML injection 1 mg  1 mg Intravenous Once Ria Bush, MD   1 mg at 09/04/11 0300  . morphine 2 MG/ML injection 2 mg  2 mg Intravenous Q4H PRN Ria Bush, MD   2 mg at 09/04/11 0837  . ondansetron (ZOFRAN) tablet 4 mg  4 mg Oral Q6H PRN Ria Bush, MD       Or  . ondansetron [facility]) injection 4 mg  4 mg Intravenous Q6H PRN Ria Bush, MD      . pantoprazole (PROTONIX) injection 40 mg  40 mg Intravenous Once Martie Lee, PA   40 mg at 09/03/11 2303  . pantoprazole (PROTONIX) injection 40 mg  40 mg Intravenous Q12H Ria Bush, MD      . sodium  chloride 0.9 % bolus 1,000 mL  1,000 mL Intravenous Once Martie Lee, PA   1,000 mL at 09/03/11 2303  . DISCONTD: ondansetron (ZOFRAN) injection 4 mg  4 mg Intravenous Q6H PRN Hans Eden, MD   4 mg at 09/03/11 2303    Allergies as of 09/03/2011 - Review Complete 09/03/2011  Allergen Reaction Noted  . Benadryl allergy  07/22/2011  . Penicillins  07/22/2011    History reviewed. No pertinent family history.  History   Social History  . Marital Status: Single    Spouse Name: N/A    Number of Children: N/A  . Years of Education: N/A   Occupational History  . Not on file.   Social History Main Topics  . Smoking status: Current Everyday Smoker -- 0.5 packs/day    Types: Cigarettes  . Smokeless tobacco: Never Used  . Alcohol Use: Yes     occsionally  . Drug Use: 2 per week    Special: Marijuana  . Sexually Active: Yes    Birth Control/ Protection: None   Other Topics Concern  . Not on file   Social History Narrative  . No narrative on file    Review of Systems: Pertinent positive and negative review of systems were noted in the above HPI section.  All other review of systems was otherwise negative. Physical Exam: Vital signs in last 24 hours: Temp:  [97.6 F (36.4 C)-98.9 F (37.2 C)] 97.6 F (36.4 C) (02/11 0600) Pulse Rate:  [55-102] 61  (02/11 0700) Resp:  [16-20] 16  (02/11 0600) BP: (89-118)/(51-84) 89/51 mmHg (02/11 0700) SpO2:  [94 %-100 %] 96 % (02/11 0700) Weight:  [104 lb (47.174 kg)] 104 lb (47.174 kg) (02/11 0356) Last BM Date: 09/03/11 General:   Alert,  Well-developed,thin, pleasant and cooperative in NAD Head:  Normocephalic and atraumatic. Eyes:  Sclera clear, no icterus.   Conjunctiva pink. Ears:  Normal auditory acuity. Nose:  No deformity, discharge,  or lesions. Mouth: poor dentition.   Neck:  Supple; no masses or thyromegaly. Lungs:  Clear throughout to auscultation.   No wheezes, crackles, or rhonchi. Heart:  Regular rate and rhythm; no  murmurs, clicks, rubs,  or gallops. Abdomen:  Soft,tender epigastrium,no guarding, no mass  or hsm,bs+Msk:  Symmetrical without gross deformities. . Pulses:  Normal pulses noted. Extremities:  Without clubbing or edema. Neurologic:  Alert and  oriented x4;  grossly normal neurologically. Skin:  Intact without significant lesions multiple tattoes Psych:  Alert and cooperative.  Normal mood and affect.  Intake/Output from previous day:   Intake/Output this shift:    Lab Results:  Basename 09/04/11 0453 09/03/11 2300  WBC 7.5 9.2  HGB 12.6* 15.9  HCT 36.0* 43.4  PLT 192 255   BMET  Basename 09/03/11 2300  NA 140  K 3.6  CL 104  CO2 27  GLUCOSE 93  BUN 10  CREATININE 0.98  CALCIUM 9.7     PT/INR  Basename 09/04/11 0453  LABPROT 15.5*  INR 1.20        Studies/Results: No results found.  IMPRESSION:  #59 31 year old male with history of Crohn's colitis status post left colon resection 2007, disease quiescent since  and on no regular maintenance meds. #2 acute hematemesis with 3 g drop in hemoglobin. This is in the setting of 4-5 day history of throbbing epigastric pain, and probable chronic cocaine and amphetamine use. Rule out peptic ulcer disease, rule out Mallory-Weiss tear, rule out drug-induced ischemic injury to the upper GI tract. #3   bipolar disorder, and ADHD  PLAN: #1 continue Protonix 40 mg IV every 12 hours #2 serial hemoglobin/hematocrit and transfuse as indicated. #3 Will schedule for upper endoscopy today with Dr. Fuller Plan, have discussed with patient and his significant other and he is agreeable to proceed. #4 watch for withdrawal symptoms.   Vilas Edgerly  09/04/2011, 9:03 AM

## 2011-09-04 NOTE — Progress Notes (Signed)
Patient is a 31 year old gentleman admitted earlier this morning with a history of Crohn's colitis who has had quiescent disease since about 2007 and is not on any maintenance medications. Presents with hematemesis. Continue proton pump inhibitor, has already been seen by [facility] who is planning on endoscopy later today.

## 2011-09-04 NOTE — ED Notes (Signed)
Pt family member up to desk asking for more pain meds.  Notified dr. Quay Burow earlier.  O.K. To give morphine if pain not resolved by prior meds.  Order placed and given.

## 2011-09-04 NOTE — Progress Notes (Signed)
Pt requesting order to leave the dept and go outside for fresh air. MD notified. Pt informed that per MD, he was not to leave the department because of his recent procedure and meds given. Pt states RN can "pretend she wasn't aware that pt was off the unit" This RN reminded pt of MD order and of hospital's no smoking policy. Pt offered Nicotine patch several times throughout the day and patient denied, stating they never work. Will continue to reinforce and monitor patient.

## 2011-09-04 NOTE — ED Notes (Signed)
Pt reports hx of crohn's disease and bowel resection. Pt states yesterday evening approx 1900 he began having n/v w/ BRB - denies fever or diarrhea - pt admits to x1 episode of dark clots in emesis while in department. Pt in no acute distress at present.

## 2011-09-04 NOTE — Op Note (Signed)
[facility] [place], [place]  [id number]  ENDOSCOPY PROCEDURE REPORT  PATIENT:  [name], [name]  MR#:  [id number] BIRTHDATE:  [birth year], 30 yrs. old  GENDER:  male ENDOSCOPIST:  Norberto Sorenson T. Fuller Plan, MD, [facility] Referred by:  [facility], unassigned call PROCEDURE DATE:  09/04/2011 PROCEDURE:  EGD, diagnostic 43235 ASA CLASS:  Class II INDICATIONS:  hematemesis, epigastric pain, anemia MEDICATIONS:   Fentanyl 75 mcg IV, Versed 5 mg IV TOPICAL ANESTHETIC:  Cetacaine Spray DESCRIPTION OF PROCEDURE:   After the risks benefits and alternatives of the procedure were thoroughly explained, informed consent was obtained.  The Pentax Gastroscope [id number] endoscope was introduced through the mouth and advanced to the second portion of the duodenum, without limitations.  The instrument was slowly withdrawn as the mucosa was fully examined. <<PROCEDUREIMAGES>> A few scattered erosions were found in the body of the stomach. Otherwse a normal stomah. Duodenitis was found in the bulb of the duodenum. It was mild and erythematous. Otherwise normal duodenum. The esophagus and gastroesophageal junction were completely normal in appearance.   Retroflexed views revealed a hiatal hernia, small.  The scope was then withdrawn from the patient and the procedure completed.  COMPLICATIONS:  None  ENDOSCOPIC IMPRESSION: 1) Gastric erosions 2) Duodenitis 3) Small hiatal hernia  RECOMMENDATIONS: 1) avoid illict drugs and NSAIDs 2) PPI qam for 8 weeks  Vern Prestia T. Fuller Plan, MD, [facility]  n. eSIGNED:   Pricilla Riffle. Brecklynn Jian at 09/04/2011 01:56 PM  Deretha Emory, [phone]

## 2011-09-05 ENCOUNTER — Encounter (HOSPITAL_COMMUNITY): Payer: Self-pay | Admitting: Family Medicine

## 2011-09-05 LAB — HEMOGLOBIN AND HEMATOCRIT, BLOOD
HCT: 38 % — ABNORMAL LOW (ref 39.0–52.0)
HCT: 41.7 % (ref 39.0–52.0)
HCT: 39.5 % (ref 39.0–52.0)
Hemoglobin: 13.3 g/dL (ref 13.0–17.0)
Hemoglobin: 13.8 g/dL (ref 13.0–17.0)

## 2011-09-05 LAB — POCT I-STAT, CHEM 8
BUN: <3 mg/dL — ABNORMAL LOW (ref 6–23)
Calcium, Ion: 1.13 mmol/L (ref 1.12–1.32)
Chloride: 107 mEq/L (ref 96–112)
Glucose, Bld: 76 mg/dL (ref 70–99)
HCT: 34 % — ABNORMAL LOW (ref 39.0–52.0)
Potassium: 3.3 mEq/L — ABNORMAL LOW (ref 3.5–5.1)

## 2011-09-05 LAB — CBC
Hemoglobin: 13.7 g/dL (ref 13.0–17.0)
Platelets: 188 10*3/uL (ref 150–400)
RBC: 4.06 MIL/uL — ABNORMAL LOW (ref 4.22–5.81)
WBC: 9.3 10*3/uL (ref 4.0–10.5)

## 2011-09-05 LAB — DIFFERENTIAL
Lymphocytes Relative: 36 % (ref 12–46)
Lymphs Abs: 3.3 10*3/uL (ref 0.7–4.0)
Monocytes Relative: 7 % (ref 3–12)
Neutro Abs: 4.9 10*3/uL (ref 1.7–7.7)
Neutrophils Relative %: 53 % (ref 43–77)

## 2011-09-05 LAB — BASIC METABOLIC PANEL
CO2: 23 mEq/L (ref 19–32)
Calcium: 8.2 mg/dL — ABNORMAL LOW (ref 8.4–10.5)
Chloride: 109 mEq/L (ref 96–112)
Creatinine, Ser: 0.91 mg/dL (ref 0.50–1.35)
Glucose, Bld: 80 mg/dL (ref 70–99)
Sodium: 139 mEq/L (ref 135–145)

## 2011-09-05 LAB — HEPATIC FUNCTION PANEL
ALT: 13 U/L (ref 0–53)
AST: 17 U/L (ref 0–37)
Total Protein: 5.9 g/dL — ABNORMAL LOW (ref 6.0–8.3)

## 2011-09-05 LAB — SAMPLE TO BLOOD BANK

## 2011-09-05 MED ORDER — NICOTINE 14 MG/24HR TD PT24
14.0000 mg | MEDICATED_PATCH | Freq: Every day | TRANSDERMAL | Status: DC
  Administered 2011-09-05 – 2011-09-06 (×2): 14 mg via TRANSDERMAL
  Filled 2011-09-05 (×2): qty 1

## 2011-09-05 MED ORDER — PANTOPRAZOLE SODIUM 40 MG IV SOLR
40.0000 mg | Freq: Two times a day (BID) | INTRAVENOUS | Status: DC
  Administered 2011-09-05 – 2011-09-06 (×3): 40 mg via INTRAVENOUS
  Filled 2011-09-05 (×4): qty 40

## 2011-09-05 MED ORDER — HYDROCODONE-ACETAMINOPHEN 5-325 MG PO TABS
1.0000 | ORAL_TABLET | ORAL | Status: DC | PRN
Start: 2011-09-05 — End: 2011-09-06
  Administered 2011-09-05 – 2011-09-06 (×8): 2 via ORAL
  Filled 2011-09-05 (×8): qty 2

## 2011-09-05 MED ORDER — ONDANSETRON HCL 4 MG/2ML IJ SOLN
4.0000 mg | Freq: Once | INTRAMUSCULAR | Status: AC
  Administered 2011-09-05: 4 mg via INTRAVENOUS
  Filled 2011-09-05: qty 2

## 2011-09-05 MED ORDER — ALPRAZOLAM 1 MG PO TABS
1.0000 mg | ORAL_TABLET | Freq: Two times a day (BID) | ORAL | Status: DC
  Administered 2011-09-05 – 2011-09-06 (×5): 1 mg via ORAL
  Filled 2011-09-05 (×4): qty 1
  Filled 2011-09-05: qty 2

## 2011-09-05 MED ORDER — IPRATROPIUM BROMIDE 0.02 % IN SOLN: 0.5000 mg | RESPIRATORY_TRACT | Status: DC | PRN

## 2011-09-05 MED ORDER — PANTOPRAZOLE SODIUM 40 MG IV SOLR
40.0000 mg | Freq: Once | INTRAVENOUS | Status: AC
  Administered 2011-09-05: 40 mg via INTRAVENOUS
  Filled 2011-09-05: qty 40

## 2011-09-05 MED ORDER — ALBUTEROL SULFATE (5 MG/ML) 0.5% IN NEBU: 2.5000 mg | INHALATION_SOLUTION | RESPIRATORY_TRACT | Status: DC | PRN

## 2011-09-05 MED ORDER — AMPHETAMINE-DEXTROAMPHETAMINE 20 MG PO TABS
20.0000 mg | ORAL_TABLET | Freq: Two times a day (BID) | ORAL | Status: DC
  Administered 2011-09-05 – 2011-09-06 (×5): 20 mg via ORAL
  Filled 2011-09-05 (×5): qty 1

## 2011-09-05 MED ORDER — MORPHINE SULFATE 2 MG/ML IJ SOLN
1.0000 mg | INTRAMUSCULAR | Status: DC | PRN
  Administered 2011-09-05 – 2011-09-06 (×6): 1 mg via INTRAVENOUS
  Filled 2011-09-05 (×6): qty 1
  Filled 2011-09-05: qty 2

## 2011-09-05 MED ORDER — NICOTINE 14 MG/24HR TD PT24: 14.0000 mg | MEDICATED_PATCH | Freq: Every day | TRANSDERMAL | Status: DC

## 2011-09-05 MED ORDER — FENTANYL CITRATE 0.05 MG/ML IJ SOLN
50.0000 ug | Freq: Once | INTRAMUSCULAR | Status: AC
  Administered 2011-09-05: 01:00:00 via INTRAVENOUS
  Filled 2011-09-05: qty 2

## 2011-09-05 MED ORDER — POTASSIUM CHLORIDE IN NACL 20-0.9 MEQ/L-% IV SOLN
INTRAVENOUS | Status: DC
  Administered 2011-09-05: 04:00:00 via INTRAVENOUS
  Filled 2011-09-05 (×4): qty 1000

## 2011-09-05 NOTE — ED Provider Notes (Signed)
History     CSN: [id number]  Arrival date & time 09/04/11  6948   First MD Initiated Contact with Patient 09/05/11 0056      Chief Complaint  Patient presents with  . Hematemesis    (Consider location/radiation/quality/duration/timing/severity/associated sxs/prior treatment) Patient is a 31 y.o. male presenting with abdominal pain. The history is provided by the patient. No language interpreter was used.  Abdominal Pain The primary symptoms of the illness include abdominal pain, vomiting and hematemesis. The primary symptoms of the illness do not include fever, fatigue, shortness of breath or hematochezia. The current episode started more than 2 days ago. The onset of the illness was sudden. The problem has not changed since onset. The abdominal pain radiates to the epigastric region. The severity of the abdominal pain is 10/10. The abdominal pain is relieved by nothing. Exacerbated by: nothing.  The initial episode of hematemesis occurred more than 2 days ago. The hematemesis is a new problem. The hematemesis has occurred more than 5 times. Associated with: denies. The hematemesis is not associated with weakness.  Associated with: none. The patient has not had a change in bowel habit. Risk factors for an acute abdominal problem include a history of abdominal surgery. Symptoms associated with the illness do not include chills, anorexia, diaphoresis or hematuria. Significant associated medical issues include substance abuse.    Past Medical History  Diagnosis Date  . Crohn disease Dx 2004    terminal ileum Crohn's s/p ileocolectomy with anastamosis  . Bipolar 1 disorder     Self laceration and self harm behaviors   . Substance abuse   . Depression   . Anxiety   . Asthma     mild   . Hypertension     Past Surgical History  Procedure Date  . Colon surgery 01/17/2006  . Inguinal hernia repair     On the right and the left   . Tonsillectomy     20 yrs ago    Family History    Problem Relation Age of Onset  . Hypertension Other   . Diabetes Other     History  Substance Use Topics  . Smoking status: Current Everyday Smoker -- 0.5 packs/day    Types: Cigarettes  . Smokeless tobacco: Never Used  . Alcohol Use: Yes     occsionally      Review of Systems  Constitutional: Negative for fever, chills, diaphoresis and fatigue.  HENT: Negative.   Eyes: Negative.   Respiratory: Negative for shortness of breath.   Cardiovascular: Negative.   Gastrointestinal: Positive for vomiting, abdominal pain and hematemesis. Negative for hematochezia and anorexia.  Genitourinary: Negative for hematuria.  Musculoskeletal: Negative.   Neurological: Negative for weakness.  Hematological: Negative.   Psychiatric/Behavioral: Negative.     Allergies  Benadryl allergy and Penicillins  Home Medications   Current Outpatient Rx  Name Route Sig Dispense Refill  . ALPRAZOLAM 1 MG PO TABS Oral Take 1 mg by mouth 2 (two) times daily.    . AMPHETAMINE-DEXTROAMPHETAMINE 20 MG PO TABS Oral Take 20 mg by mouth 2 (two) times daily.    . ALBUTEROL SULFATE HFA 108 (90 BASE) MCG/ACT IN AERS Inhalation Inhale 2 puffs into the lungs every 4 (four) hours as needed for wheezing or shortness of breath (cough). 1 Inhaler 0    BP 123/86  Pulse 61  Temp(Src) 98.3 F (36.8 C) (Oral)  Resp 20  SpO2 99%  Physical Exam  Constitutional: He is oriented to person, place,  and time. He appears well-developed and well-nourished. No distress.  HENT:  Head: Normocephalic and atraumatic.  Mouth/Throat: Oropharynx is clear and moist.  Eyes: Conjunctivae are normal. Pupils are equal, round, and reactive to light.  Neck: Normal range of motion. Neck supple.  Cardiovascular: Normal rate and regular rhythm.   Pulmonary/Chest: Effort normal and breath sounds normal. He has no wheezes.  Abdominal: Soft. Bowel sounds are normal. There is tenderness. There is no rebound and no guarding.   Musculoskeletal: Normal range of motion.  Neurological: He is alert and oriented to person, place, and time.  Skin: Skin is warm and dry.  Psychiatric: He has a normal mood and affect.    ED Course  Procedures (including critical care time)  Labs Reviewed  CBC - Abnormal; Notable for the following:    RBC 4.06 (*)    HCT 37.7 (*)    MCHC 36.3 (*)    All other components within normal limits  HEPATIC FUNCTION PANEL - Abnormal; Notable for the following:    Total Protein 5.9 (*)    Total Bilirubin 0.2 (*)    All other components within normal limits  POCT I-STAT, CHEM 8 - Abnormal; Notable for the following:    Potassium 3.3 (*)    BUN <3 (*)    Hemoglobin 11.6 (*)    HCT 34.0 (*)    All other components within normal limits  DIFFERENTIAL  SAMPLE TO BLOOD BANK  BASIC METABOLIC PANEL  CBC  HEMOGLOBIN AND HEMATOCRIT, BLOOD  HEMOGLOBIN AND HEMATOCRIT, BLOOD  HEMOGLOBIN AND HEMATOCRIT, BLOOD   No results found.   1. Hematemesis   2. Abdominal  pain, other specified site       MDM          Daran Favaro K Zehra Rucci-Rasch, MD 09/05/11 0345

## 2011-09-05 NOTE — Progress Notes (Signed)
PATIENT DETAILS Name: [name] Age: 31 y.o. Sex: male Date of Birth: 12/16/1980 Admit Date: 09/04/2011 PZW:CHENIDP,OEUMPNTI, MD, MD  Subjective: No further hematemesis Still having epigastric pain Currently n.p.o. and wants to eat  Objective: Vital signs in last 24 hours: Filed Vitals:   09/04/11 2023 09/05/11 0335 09/05/11 0647 09/05/11 1400  BP: 123/86 119/83 141/81 136/85  Pulse: 61 51 52 62  Temp: 98.3 F (36.8 C) 97.4 F (36.3 C) 97.5 F (36.4 C) 98.3 F (36.8 C)  TempSrc: Oral Oral Oral Oral  Resp: 20 16 18 18   Height:  5' 5.5" (1.664 m)    Weight:  48.263 kg (106 lb 6.4 oz)    SpO2: 99% 99% 97% 99%    Weight change:   Body mass index is 17.44 kg/(m^2).  Intake/Output from previous day:  Intake/Output Summary (Last 24 hours) at 09/05/11 1436 Last data filed at 09/05/11 1400  Gross per 24 hour  Intake    960 ml  Output   1000 ml  Net    -40 ml    PHYSICAL EXAM: Gen Exam: Awake and alert with clear speech.   Neck: Supple, No JVD.   Chest: B/L Clear.   CVS: S1 S2 Regular, no murmurs.  Abdomen: soft, BS +, non tender, non distended.  Extremities: no edema, lower extremities warm to touch Neurologic: Non Focal.   Skin: No Rash.   Wounds: N/A.    CONSULTS:  None  LAB RESULTS: CBC  Lab 09/05/11 1101 09/05/11 0331 09/05/11 0147 09/05/11 0110 09/04/11 0940 09/04/11 0453 09/03/11 2300  WBC -- -- -- 9.3 6.5 7.5 9.2  HGB 14.5 13.3 11.6* 13.7 13.3 -- --  HCT 41.7 38.0* 34.0* 37.7* 37.4* -- --  PLT -- -- -- 188 191 192 255  MCV -- -- -- 92.9 94.2 93.5 92.9  MCH -- -- -- 33.7 33.5 32.7 34.0  MCHC -- -- -- 36.3* 35.6 35.0 36.6*  RDW -- -- -- 12.2 12.2 12.3 12.4  LYMPHSABS -- -- -- 3.3 -- -- 2.7  MONOABS -- -- -- 0.6 -- -- 0.7  EOSABS -- -- -- 0.4 -- -- 0.2  BASOSABS -- -- -- 0.0 -- -- 0.0  BANDABS -- -- -- -- -- -- --    Chemistries   Lab 09/05/11 0411 09/05/11 0147 09/03/11 2300  NA 139 144 140  K 3.5 3.3* 3.6  CL 109 107 104  CO2 23 --  27  GLUCOSE 80 76 93  BUN 5* <3* 10  CREATININE 0.91 0.80 0.98  CALCIUM 8.2* -- 9.7  MG -- -- --    GFR Estimated Creatinine Clearance: 80.4 ml/min (by C-G formula based on Cr of 0.91).  Coagulation profile  Lab 09/04/11 0453  INR 1.20  PROTIME --    Cardiac Enzymes No results found for this basename: CK:3,CKMB:3,TROPONINI:3,MYOGLOBIN:3 in the last 168 hours  No components found with this basename: POCBNP:3 No results found for this basename: DDIMER:2 in the last 72 hours No results found for this basename: HGBA1C:2 in the last 72 hours No results found for this basename: CHOL:2,HDL:2,LDLCALC:2,TRIG:2,CHOLHDL:2,LDLDIRECT:2 in the last 72 hours No results found for this basename: TSH,T4TOTAL,FREET3,T3FREE,THYROIDAB in the last 72 hours No results found for this basename: VITAMINB12:2,FOLATE:2,FERRITIN:2,TIBC:2,IRON:2,RETICCTPCT:2 in the last 72 hours No results found for this basename: LIPASE:2,AMYLASE:2 in the last 72 hours  Urine Studies No results found for this basename: UACOL:2,UAPR:2,USPG:2,UPH:2,UTP:2,UGL:2,UKET:2,UBIL:2,UHGB:2,UNIT:2,UROB:2,ULEU:2,UEPI:2,UWBC:2,URBC:2,UBAC:2,CAST:2,CRYS:2,UCOM:2,BILUA:2 in the last 72 hours  MICROBIOLOGY: No results found for this or any previous visit (from  the past 240 hour(s)).  RADIOLOGY STUDIES/RESULTS: No results found.  MEDICATIONS: Scheduled Meds:   . ALPRAZolam  1 mg Oral BID  . amphetamine-dextroamphetamine  20 mg Oral BID  . fentaNYL  50 mcg Intravenous Once  . nicotine  14 mg Transdermal Daily  . ondansetron  4 mg Intravenous Once  . pantoprazole (PROTONIX) IV  40 mg Intravenous Once  . pantoprazole (PROTONIX) IV  40 mg Intravenous Q12H  . DISCONTD: nicotine  14 mg Transdermal Daily   Continuous Infusions:   . DISCONTD: 0.9 % NaCl with KCl 20 mEq / L 100 mL/hr at 09/05/11 0345   PRN Meds:.albuterol, HYDROcodone-acetaminophen, ipratropium, morphine injection  Antibiotics: Anti-infectives    None       Assessment/Plan: Patient Active Hospital Problem List:  Epigastric pain -Likely from gastritis/gastric erosions seen on EGD -Continue PPI -Advance diet  Hematemesis  -Resolved -H&H stable -Results noted  AADHD  -Continue with Adderall  CROHN'S DISEASE -Stable -Not on any medication -Outpatient follow up with GI  COCAINE ABUSE, HX OF  -Counseled extensively  Disposition: -Home once tolerates diet  Jonetta Osgood,  MD. 09/05/2011, 2:36 PM

## 2011-09-05 NOTE — H&P (Addendum)
PCP:   William Hamburger, MD, MD   Chief Complaint:  Hematemesis  HPI: This is a 31 year old male was admitted yesterday for hematemesis. He states he had EGD today which revealed gastric erosions and duodenitis. he was maintained on a clear liquid diet. Apparently overnight he was asking for his diet to be advanced, which was denied. He was also requesting to outside. He went to the left AMA. He states his the abdominal pain continued as well as his hematemesis and he came back to the ER. The hospitalist service was called with a request for readmission. His pain remains in the epigastric region.  Review of Systems:   anorexia, fever, weight loss,, vision loss, decreased hearing, hoarseness, chest pain, syncope, dyspnea on exertion, peripheral edema, balance deficits, hemoptysis, abdominal pain, melena, hematochezia, severe indigestion/heartburn, hematuria, incontinence, genital sores, muscle weakness, suspicious skin lesions, transient blindness, difficulty walking, depression, unusual weight change, abnormal bleeding, enlarged lymph nodes, angioedema, and breast masses.  Past Medical History: Past Medical History  Diagnosis Date  . Crohn disease Dx 2004    terminal ileum Crohn's s/p ileocolectomy with anastamosis  . Bipolar 1 disorder     Self laceration and self harm behaviors   . Substance abuse   . Depression   . Anxiety   . Asthma     mild   . Hypertension    Past Surgical History  Procedure Date  . Colon surgery 01/17/2006  . Inguinal hernia repair     On the right and the left   . Tonsillectomy     20 yrs ago    Medications: Prior to Admission medications   Medication Sig Start Date End Date Taking? Authorizing Provider  ALPRAZolam Duanne Moron) 1 MG tablet Take 1 mg by mouth 2 (two) times daily.   Yes Historical Provider, MD  amphetamine-dextroamphetamine (ADDERALL) 20 MG tablet Take 20 mg by mouth 2 (two) times daily.   Yes Historical Provider, MD  albuterol (PROVENTIL  HFA;VENTOLIN HFA) 108 (90 BASE) MCG/ACT inhaler Inhale 2 puffs into the lungs every 4 (four) hours as needed for wheezing or shortness of breath (cough). 07/22/11 07/21/12  Otis Brace, PA    Allergies:   Allergies  Allergen Reactions  . Benadryl Allergy   . Penicillins     Social History:  reports that he has been smoking Cigarettes.  He has been smoking about .5 packs per day. He has never used smokeless tobacco. He reports that he drinks alcohol. He reports that he uses illicit drugs (Marijuana) about twice per week.  Family History: Family History  Problem Relation Age of Onset  . Hypertension Other   . Diabetes Other     Physical Exam: Filed Vitals:   09/04/11 2023  BP: 123/86  Pulse: 61  Temp: 98.3 F (36.8 C)  TempSrc: Oral  Resp: 20  SpO2: 99%    General:  Alert and oriented times three, well developed and nourished, no acute distress Eyes: PERRLA, pink conjunctiva, no scleral icterus ENT: Moist oral mucosa, neck supple, no thyromegaly Lungs: clear to ascultation, no wheeze, no crackles, no use of accessory muscles Cardiovascular: regular rate and rhythm, no regurgitation, no gallops, no murmurs. No carotid bruits, no JVD Abdomen: soft, positive BS, nonspecific tenderness to palpation, no organomegaly, not an acute abdomen GU: not examined Neuro: CN II - XII grossly intact, sensation intact Musculoskeletal: strength 5/5 all extremities, no clubbing, cyanosis or edema Skin: no rash, no subcutaneous crepitation, no decubitus Psych: appropriate patient   Labs on  Admission:   [facility] 09/05/11 0147 09/03/11 2300  NA 144 140  K 3.3* 3.6  CL 107 104  CO2 -- 27  GLUCOSE 76 93  BUN <3* 10  CREATININE 0.80 0.98  CALCIUM -- 9.7  MG -- --  PHOS -- --    Basename 09/05/11 0110  AST 17  ALT 13  ALKPHOS 87  BILITOT 0.2*  PROT 5.9*  ALBUMIN 3.5   No results found for this basename: LIPASE:2,AMYLASE:2 in the last 72 hours  Basename 09/05/11 0147  09/05/11 0110 09/04/11 0940 09/03/11 2300  WBC -- 9.3 6.5 --  NEUTROABS -- 4.9 -- 5.6  HGB 11.6* 13.7 -- --  HCT 34.0* 37.7* -- --  MCV -- 92.9 94.2 --  PLT -- 188 191 --   No results found for this basename: CKTOTAL:3,CKMB:3,CKMBINDEX:3,TROPONINI:3 in the last 72 hours No components found with this basename: POCBNP:3 No results found for this basename: DDIMER:2 in the last 72 hours No results found for this basename: HGBA1C:2 in the last 72 hours No results found for this basename: CHOL:2,HDL:2,LDLCALC:2,TRIG:2,CHOLHDL:2,LDLDIRECT:2 in the last 72 hours No results found for this basename: TSH,T4TOTAL,FREET3,T3FREE,THYROIDAB in the last 72 hours No results found for this basename: VITAMINB12:2,FOLATE:2,FERRITIN:2,TIBC:2,IRON:2,RETICCTPCT:2 in the last 72 hours  Micro Results: No results found for this or any previous visit (from the past 240 hour(s)).   Radiological Exams on Admission: No results found.  Assessment/Plan Present on Admission:  .Hematemesis/gastric erosions/duodenitis  Abdominal pain Patient re-admitted N.p.o., IV fluid hydration and Protonix twice a day  Pain medications when necessary  H&H every 12  Zofran when necessary nausea vomiting Patient it readmitted mainly to ensure he can tolerate a diet. His accounting of events appear is unreliable, I'm unclear if he has ongoing hematemesis but will monitor. Medical noncompliance  .ANEMIA .BIPOLAR DISORDER UNSPECIFIED .ADHD .CROHN'S DISEASE Polysubstance abuse  Noncompliance  Resume home medications Tobacco abuse Nicotine patch  Full code SCDs for DVT prophylaxis Team 4/Dr. Kennieth Francois  Tianah Lonardo 09/05/2011, 3:01 AM

## 2011-09-06 LAB — HEMOGLOBIN AND HEMATOCRIT, BLOOD
HCT: 42 % (ref 39.0–52.0)
Hemoglobin: 14.9 g/dL (ref 13.0–17.0)

## 2011-09-06 MED ORDER — OMEPRAZOLE 40 MG PO CPDR: 40.0000 mg | DELAYED_RELEASE_CAPSULE | Freq: Every day | ORAL | Status: DC

## 2011-09-06 MED ORDER — LORAZEPAM 2 MG/ML IJ SOLN
1.0000 mg | Freq: Once | INTRAMUSCULAR | Status: AC
  Administered 2011-09-06: 1 mg via INTRAVENOUS
  Filled 2011-09-06: qty 1

## 2011-09-06 MED ORDER — OXYCODONE-ACETAMINOPHEN 10-325 MG PO TABS: 1.0000 | ORAL_TABLET | ORAL | Status: AC | PRN

## 2011-09-06 NOTE — Progress Notes (Signed)
Pt asking about  what medications he has ordered and dosages of medications - ordered medications reviewed and explained,Pt requesting ,morphine 1 mg be increased -he states he did not know it was 1 mg -he thought it was more than that, M.Edwards notified for increase in morphine dosage -instructed  morphine not to be increased at this time and continue morphine and vicodin as ordered.

## 2011-09-06 NOTE — Progress Notes (Signed)
Pt requesting ativan to make him sleep.Sylvester Harder ,PA notified and order received.

## 2011-09-06 NOTE — Progress Notes (Addendum)
Patient stating, "i can't get my medicine, I don't have the money.  I will have to pan handle in the morning for the money."  Spoke with patient concerning needs.  Called Case management- Atika concerning pts needs.  Notified Dr Rosana Hoes of the above situation.  Pt okay to discharge with 3 day supply of Prilosec.  Spoke with Case management concerning patients need and medicine.  Pharmacy notified of the above and prescription sent to pharmacy.  Case Management setting up appointment for [facility].  [phone]- Appointment set-up for [facility].  Spoke with Dr Rosana Hoes concerning Prilosec.  Patient given 3 day supply and can obtain over the counter after.  Discharge instructions given to patient.  Discharge information verbalized by patient.  Patient red in face, and cussing in hallway.  Patient refused to go to pharmacy to obtain medicine.  Patient cussing and threatening staff.  Patient taken to pharmacy.  Wife signed for medicine.  Patient and wife escorted to the main lobby.  Patient and wife demanding bus passes.  Called nursing supervisor.  Patient and wife given 2 bus passes 09/05/11, therefore not able to obtain bus passes.  Patient with hospital linen.  Hospital linen given by patient and patient and wife left the building.  Nursing supervisor, Lynetta Mare present.  Patient left hospital at 1645.

## 2011-09-06 NOTE — Progress Notes (Signed)
09-06-11 Received call from nursing to find out if patient can receive assistance for Prilosec. [name] qualifies for indigent funds per pharmacy. Informed nursing that patient will utilize indigent funds for a med that can be purchased over the counter. MD still thought it was important that patient had at least 3 days worth since patient states he can not afford his meds and his medical condition. Will not be able to assist for narcotic through indigent funds.Made [facility] appts for 10-04-11 at 0930 am for eligibility and for 10-09-11 at 230pm with Dr. Jarold Song at [facility]. Asked Margarita Grizzle, the nurse to please write these appts on his dc papers. Patient has been familiarized with [facility] from his past hospital visits.  [place], [place] [phone]

## 2011-09-09 ENCOUNTER — Emergency Department (HOSPITAL_COMMUNITY)
Admission: EM | Admit: 2011-09-09 | Discharge: 2011-09-10 | Disposition: A | Discharge status: Home / Self Care | Payer: Self-pay | Attending: Emergency Medicine | Admitting: Emergency Medicine

## 2011-09-09 ENCOUNTER — Encounter (HOSPITAL_COMMUNITY): Payer: Self-pay | Admitting: *Deleted

## 2011-09-09 DIAGNOSIS — F1410 Cocaine abuse, uncomplicated: Secondary | ICD-10-CM | POA: Insufficient documentation

## 2011-09-09 DIAGNOSIS — F319 Bipolar disorder, unspecified: Secondary | ICD-10-CM | POA: Insufficient documentation

## 2011-09-09 DIAGNOSIS — R109 Unspecified abdominal pain: Secondary | ICD-10-CM | POA: Insufficient documentation

## 2011-09-09 DIAGNOSIS — Z79899 Other long term (current) drug therapy: Secondary | ICD-10-CM | POA: Insufficient documentation

## 2011-09-09 DIAGNOSIS — J45909 Unspecified asthma, uncomplicated: Secondary | ICD-10-CM | POA: Insufficient documentation

## 2011-09-09 DIAGNOSIS — F17200 Nicotine dependence, unspecified, uncomplicated: Secondary | ICD-10-CM | POA: Insufficient documentation

## 2011-09-09 DIAGNOSIS — I10 Essential (primary) hypertension: Secondary | ICD-10-CM | POA: Insufficient documentation

## 2011-09-09 DIAGNOSIS — R112 Nausea with vomiting, unspecified: Secondary | ICD-10-CM | POA: Insufficient documentation

## 2011-09-09 DIAGNOSIS — R1013 Epigastric pain: Secondary | ICD-10-CM | POA: Insufficient documentation

## 2011-09-09 DIAGNOSIS — F411 Generalized anxiety disorder: Secondary | ICD-10-CM | POA: Insufficient documentation

## 2011-09-09 LAB — PROTIME-INR: INR: 1 (ref 0.00–1.49)

## 2011-09-09 LAB — RAPID URINE DRUG SCREEN, HOSP PERFORMED
Amphetamines: NOT DETECTED
Cocaine: NOT DETECTED
Opiates: NOT DETECTED
Tetrahydrocannabinol: POSITIVE — AB

## 2011-09-09 LAB — LIPASE, BLOOD: Lipase: 22 U/L (ref 11–59)

## 2011-09-09 LAB — CBC
HCT: 44.6 % (ref 39.0–52.0)
MCH: 33.7 pg (ref 26.0–34.0)
MCHC: 35.4 g/dL (ref 30.0–36.0)
MCV: 95.1 fL (ref 78.0–100.0)
RDW: 12.3 % (ref 11.5–15.5)

## 2011-09-09 LAB — URINALYSIS, ROUTINE W REFLEX MICROSCOPIC
Glucose, UA: NEGATIVE mg/dL
Hgb urine dipstick: NEGATIVE
Ketones, ur: NEGATIVE mg/dL
Protein, ur: NEGATIVE mg/dL
pH: 7.5 (ref 5.0–8.0)

## 2011-09-09 LAB — COMPREHENSIVE METABOLIC PANEL
Albumin: 4 g/dL (ref 3.5–5.2)
BUN: 5 mg/dL — ABNORMAL LOW (ref 6–23)
Calcium: 10 mg/dL (ref 8.4–10.5)
Creatinine, Ser: 0.93 mg/dL (ref 0.50–1.35)
GFR calc Af Amer: >90 mL/min (ref >90)
Total Protein: 7.3 g/dL (ref 6.0–8.3)

## 2011-09-09 MED ORDER — ACETAMINOPHEN 500 MG PO TABS
ORAL_TABLET | ORAL | Status: AC
  Administered 2011-09-09: 1000 mg
  Filled 2011-09-09: qty 2

## 2011-09-09 MED ORDER — SODIUM CHLORIDE 0.9 % IV BOLUS (SEPSIS)
1000.0000 mL | Freq: Once | INTRAVENOUS | Status: AC
  Administered 2011-09-09: 1000 mL via INTRAVENOUS

## 2011-09-09 MED ORDER — ACETAMINOPHEN 325 MG PO TABS: 650.0000 mg | ORAL_TABLET | Freq: Once | ORAL | Status: DC

## 2011-09-09 MED ORDER — GI COCKTAIL ~~LOC~~
30.0000 mL | Freq: Once | ORAL | Status: AC
  Administered 2011-09-09: 30 mL via ORAL
  Filled 2011-09-09: qty 30

## 2011-09-09 MED ORDER — PANTOPRAZOLE SODIUM 40 MG IV SOLR
40.0000 mg | Freq: Once | INTRAVENOUS | Status: AC
  Administered 2011-09-09: 40 mg via INTRAVENOUS
  Filled 2011-09-09: qty 40

## 2011-09-09 MED ORDER — ONDANSETRON HCL 4 MG/2ML IJ SOLN
4.0000 mg | Freq: Once | INTRAMUSCULAR | Status: AC
  Administered 2011-09-09: 4 mg via INTRAVENOUS
  Filled 2011-09-09: qty 2

## 2011-09-09 NOTE — ED Notes (Signed)
Patient states that he is still vomiting blood.  He was at [facility] inpatient for same and d/c home on Wednesday 09/06/2011.  Patient is also requesting detox from crack cocaine.  Last use was yesterday.  Patient denies any suicidal or homocidal ideations.  Patient states that he has been depressed since him and his girlfriend do not have a place to go .

## 2011-09-09 NOTE — ED Provider Notes (Signed)
History     CSN: [id number]  Arrival date & time 09/09/11  1620   First MD Initiated Contact with Patient 09/09/11 1824      Chief Complaint  Patient presents with  . Hematemesis  . Emesis    (Consider location/radiation/quality/duration/timing/severity/associated sxs/prior treatment) HPI Comments: Patient presents from home with the complaint of epigastric pain and vomiting blood. He was admitted to the hospital last week with similar symptoms and discharged on the 13th. His frequency of these episodes is decreased to about 3 or 4 times a day. He had an EGD performed which showed duodenitis and gastritis. He states compliance with his medications including his Prilosec. He denies alcohol use but uses cocaine actively. He denies any chest pain, short of breath, fever and urinary symptoms. Is also requesting detoxification from crack and states that he and his girlfriend in place to go.  The history is provided by the patient.    Past Medical History  Diagnosis Date  . Crohn disease Dx 2004    terminal ileum Crohn's s/p ileocolectomy with anastamosis  . Bipolar 1 disorder     Self laceration and self harm behaviors   . Substance abuse   . Depression   . Anxiety   . Asthma     mild   . Hypertension     Past Surgical History  Procedure Date  . Colon surgery 01/17/2006  . Inguinal hernia repair     On the right and the left   . Tonsillectomy     20 yrs ago  . Esophagogastroduodenoscopy 09/04/2011    Procedure: ESOPHAGOGASTRODUODENOSCOPY (EGD);  Surgeon: Estanislado Emms., MD,FACG;  Location: [facility];  Service: Endoscopy;  Laterality: N/A;    Family History  Problem Relation Age of Onset  . Hypertension Other   . Diabetes Other     History  Substance Use Topics  . Smoking status: Current Everyday Smoker -- 0.5 packs/day    Types: Cigarettes  . Smokeless tobacco: Never Used  . Alcohol Use: Yes     occsionally      Review of Systems  Constitutional:  Positive for fatigue. Negative for appetite change.  Respiratory: Positive for chest tightness. Negative for shortness of breath.   Cardiovascular: Negative for chest pain.  Gastrointestinal: Positive for nausea, vomiting and abdominal pain. Negative for diarrhea and anal bleeding.  Genitourinary: Negative for dysuria.  Musculoskeletal: Negative for back pain.  Skin: Negative for rash.  Neurological: Negative for headaches.    Allergies  Penicillins and Benadryl allergy  Home Medications   Current Outpatient Rx  Name Route Sig Dispense Refill  . ALBUTEROL SULFATE HFA 108 (90 BASE) MCG/ACT IN AERS Inhalation Inhale 2 puffs into the lungs every 4 (four) hours as needed for wheezing or shortness of breath (cough). 1 Inhaler 0  . ALPRAZOLAM 1 MG PO TABS Oral Take 1 mg by mouth 2 (two) times daily.    . AMPHETAMINE-DEXTROAMPHETAMINE 20 MG PO TABS Oral Take 20 mg by mouth 2 (two) times daily.    [name] Kitchen OMEPRAZOLE 40 MG PO CPDR Oral Take 1 capsule (40 mg total) by mouth daily. 30 capsule 1  . OXYCODONE-ACETAMINOPHEN 10-325 MG PO TABS Oral Take 1 tablet by mouth every 4 (four) hours as needed for pain. 30 tablet 0    BP 106/78  Pulse 86  Temp(Src) 98.4 F (36.9 C) (Oral)  Resp 20  SpO2 99%  Physical Exam  Constitutional: He is oriented to person, place, and time. He appears  well-developed. No distress.  HENT:  Head: Normocephalic and atraumatic.  Mouth/Throat: Oropharynx is clear and moist. No oropharyngeal exudate.  Eyes: Conjunctivae and EOM are normal. Pupils are equal, round, and reactive to light.  Neck: Normal range of motion.  Cardiovascular: Normal rate, regular rhythm and normal heart sounds.   Pulmonary/Chest: Effort normal and breath sounds normal. No respiratory distress.  Abdominal: Soft. There is tenderness. There is no rebound and no guarding.       Epigastric tenderness  Musculoskeletal: Normal range of motion. He exhibits no edema and no tenderness.  Neurological: He is  alert and oriented to person, place, and time. No cranial nerve deficit.  Skin: Skin is warm.    ED Course  Procedures (including critical care time)  Labs Reviewed  COMPREHENSIVE METABOLIC PANEL - Abnormal; Notable for the following:    BUN 5 (*)    Total Bilirubin 0.2 (*)    All other components within normal limits  URINE RAPID DRUG SCREEN (HOSP PERFORMED) - Abnormal; Notable for the following:    Tetrahydrocannabinol POSITIVE (*)    All other components within normal limits  CBC  ETHANOL  URINALYSIS, ROUTINE W REFLEX MICROSCOPIC  LIPASE, BLOOD  PROTIME-INR   No results found.   No diagnosis found.    MDM  Epigastric pain, hematemesis, recent admission for the same. Hemodynamically stable.  Hemoglobin stable. EGD reviewed and showed evidence of erosive gastritis and duodenitis.  No further episodes of vomiting or hematemesis in the emergency department. Patient is stable hemoglobin and is not orthostatic.  He is medically clear for evaluation by the act team for detox placement.  Resources given by act team patient tolerating by mouth has had no vomiting. He stable for discharge      Ezequiel Essex, MD 09/09/11 2324

## 2011-09-09 NOTE — ED Notes (Signed)
Pt presents with no vomiting. Does C/O ABD pain. 7/10

## 2011-09-09 NOTE — BH Assessment (Signed)
Writer spoke with pt and gave him substance abuse referral info. Writer explained his current medical condition excluded him from certain residential detox/substance abuse program and that other programs do admissions during weekdays only. Pt indicates he will contact LME on Monday to look into possible placements.  Pt spoke with pt's RN and EDP. EDP will discharge.

## 2011-09-09 NOTE — BH Assessment (Signed)
Assessment Note   [name] is an 31 y.o. male. Who presents voluntarily to [facility] with primary complaint of vomiting blood and also requests detox from crack cocaine. Pt was discharged from [facility] on 09/06/11 for hematemesis. Pt states he and his girlfriend, who is also in [place], were kicked out of friends' house today and they want to enter the same residential substance abuse program. Pt describes feeling like "an empty shell" for the past few weeks. Pt endorses depressive symptoms including irritability, fatigue, loss of pleasure, insomnia, and feelings of worthlessness. He reports labile mood. His speech is soft and is hyperverbal. Pt denies SI/HI and AVH.  Pt reports crack cocaine abuse for past few years and marijuana abuse. Pt reports both parents and his sister suffer from mental illness (depression and sister dx as bipolar) and all 3 are substance abusers. Pt has been to detox twice previously at  [place].  Pt is on unsupervised probation for previous drug charges and has been arrested several times but didn't give details to Probation officer.   Axis I: Major Depressive Disorder, Recurrent           Cocaine Dependence           Opiate Dependence, Sustained Full Remission Axis II: Deferred Axis III:  Past Medical History  Diagnosis Date  . Crohn disease Dx 2004    terminal ileum Crohn's s/p ileocolectomy with anastamosis  . Bipolar 1 disorder     Self laceration and self harm behaviors   . Substance abuse   . Depression   . Anxiety   . Asthma     mild   . Hypertension    Axis IV: economic problems, housing problems, occupational problems, other psychosocial or environmental problems, problems related to legal system/crime, problems related to social environment and problems with primary support group Axis V: 41-50 serious symptoms  Past Medical History:  Past Medical History  Diagnosis Date  . Crohn disease Dx 2004    terminal ileum Crohn's s/p ileocolectomy  with anastamosis  . Bipolar 1 disorder     Self laceration and self harm behaviors   . Substance abuse   . Depression   . Anxiety   . Asthma     mild   . Hypertension     Past Surgical History  Procedure Date  . Colon surgery 01/17/2006  . Inguinal hernia repair     On the right and the left   . Tonsillectomy     20 yrs ago  . Esophagogastroduodenoscopy 09/04/2011    Procedure: ESOPHAGOGASTRODUODENOSCOPY (EGD);  Surgeon: Estanislado Emms., MD,FACG;  Location: [facility];  Service: Endoscopy;  Laterality: N/A;    Family History:  Family History  Problem Relation Age of Onset  . Hypertension Other   . Diabetes Other     Social History:  reports that he has been smoking Cigarettes.  He has been smoking about .5 packs per day. He has never used smokeless tobacco. He reports that he drinks alcohol. He reports that he uses illicit drugs (Marijuana) about twice per week.  Additional Social History:  Alcohol / Drug Use Pain Medications: none Prescriptions: none Over the Counter: none History of alcohol / drug use?: Yes Substance #1 Name of Substance 1: crack cocaine 1 - Age of First Use: 31 years old 1 - Amount (size/oz): buys as much as possible - amount varies 1 - Frequency: as often as he has $ to obtain it = 2-3 x  per week 1 - Duration: for past 11 yrs 1 - Last Use / Amount: 1 g Substance #2 Name of Substance 2: marijuana 2 - Age of First Use: 31 yo 2 - Amount (size/oz): .25 oz 2 - Frequency: once every 2 weeks 2 - Duration: past 19 yrs 2 - Last Use / Amount: 2 weeks ago - unknown amount Substance #3 Name of Substance 3: heroin 3 - Age of First Use: 31 yo 3 - Amount (size/oz): used up to 20 bags at end of his use 3 - Frequency: shot up daily after first 2 years 3 - Duration: used for 4 years 3 - Last Use / Amount: Oct 2011/unkn amount Substance #4 Name of Substance 4: alcohol 4 - Age of First Use: 15 4 - Amount (size/oz): two 40 oz 4 - Frequency: every other  day 4 - Duration: every other day started several years ago 4 - Last Use / Amount: 2 weeks ago - unkn amount Substance #5 Name of Substance 5: roxycet 5 - Age of First Use: 26 5 - Amount (size/oz): depended on how much $ - approx 2/3 roxy 61s 5 - Frequency: as often as he had money - several times per week 5 - Duration: 1 year 5 - Last Use / Amount: quit when 27 Allergies:  Allergies  Allergen Reactions  . Penicillins Anaphylaxis and Swelling  . Benadryl Allergy Hives and Rash    Home Medications:  Medications Prior to Admission  Medication Dose Route Frequency Provider Last Rate Last Dose  . acetaminophen (TYLENOL) 500 MG tablet        1,000 mg at 09/09/11 2126  . acetaminophen (TYLENOL) tablet 650 mg  650 mg Oral Once Ezequiel Essex, MD      . gi cocktail  30 mL Oral Once Ezequiel Essex, MD   30 mL at 09/09/11 1849  . ondansetron (ZOFRAN) injection 4 mg  4 mg Intravenous Once Ezequiel Essex, MD   4 mg at 09/09/11 1849  . pantoprazole (PROTONIX) injection 40 mg  40 mg Intravenous Once Ezequiel Essex, MD   40 mg at 09/09/11 1849  . sodium chloride 0.9 % bolus 1,000 mL  1,000 mL Intravenous Once Ezequiel Essex, MD   1,000 mL at 09/09/11 1843   Medications Prior to Admission  Medication Sig Dispense Refill  . albuterol (PROVENTIL HFA;VENTOLIN HFA) 108 (90 BASE) MCG/ACT inhaler Inhale 2 puffs into the lungs every 4 (four) hours as needed for wheezing or shortness of breath (cough).  1 Inhaler  0  . ALPRAZolam (XANAX) 1 MG tablet Take 1 mg by mouth 2 (two) times daily.      [name] Kitchen amphetamine-dextroamphetamine (ADDERALL) 20 MG tablet Take 20 mg by mouth 2 (two) times daily.      [name] Kitchen omeprazole (PRILOSEC) 40 MG capsule Take 1 capsule (40 mg total) by mouth daily.  30 capsule  1  . oxyCODONE-acetaminophen (PERCOCET) 10-325 MG per tablet Take 1 tablet by mouth every 4 (four) hours as needed for pain.  30 tablet  0    OB/GYN Status:  No LMP for male patient.  General Assessment  Data Location of Assessment: [facility] Living Arrangements: Friends (kicked out today w fiance) Can pt return to current living arrangement?: No Admission Status: Voluntary Is patient capable of signing voluntary admission?: Yes Transfer from: [facility] Referral Source: Self/Family/Friend  Education Status Is patient currently in school?: No  Risk to self Suicidal Ideation: No Suicidal Intent: No Is patient at risk for  suicide?: No Suicidal Plan?: No Access to Means: No What has been your use of drugs/alcohol within the last 12 months?: uses crack and thc frequently Previous Attempts/Gestures: No How many times?: 0  Other Self Harm Risks: 0 Triggers for Past Attempts:  (n/a) Intentional Self Injurious Behavior: Cutting Comment - Self Injurious Behavior: last time he cut was last oct. - cut on and off for years Family Suicide History: No Recent stressful life event(s): Financial Problems;Legal Issues Persecutory voices/beliefs?: No Depression: Yes Depression Symptoms: Feeling angry/irritable;Loss of interest in usual pleasures;Fatigue;Insomnia;Despondent Substance abuse history and/or treatment for substance abuse?: Yes Suicide prevention information given to non-admitted patients: Not applicable  Risk to Others Homicidal Ideation: No Thoughts of Harm to Others: No Current Homicidal Intent: No Current Homicidal Plan: No Access to Homicidal Means: No Identified Victim: n/a History of harm to others?: No Assessment of Violence: On admission Violent Behavior Description: charged w/ assault twice Does patient have access to weapons?: Yes (Comment) (knife) Criminal Charges Pending?: Yes Describe Pending Criminal Charges: possess drug paraphenalia/trespass/ Does patient have a court date: Yes Court Date:  (sometime in April)  Psychosis Hallucinations: None noted Delusions: None noted  Mental Status Report Appear/Hygiene: Disheveled Eye Contact: Good Motor Activity:  Freedom of movement;Unremarkable Speech: Logical/coherent;Soft (hyperverbal) Level of Consciousness: Alert Mood: Depressed;Sad Affect: Appropriate to circumstance;Depressed Anxiety Level: Minimal Thought Processes: Relevant;Coherent Judgement: Impaired Orientation: Person;Time;Appropriate for developmental age;Situation;Place Obsessive Compulsive Thoughts/Behaviors: None  Cognitive Functioning Concentration: Normal Memory: Recent Intact;Remote Intact IQ: Average Insight: Poor Impulse Control: Poor Appetite: Fair Weight Loss: 0  Weight Gain: 0  Sleep: No Change Total Hours of Sleep: 5  Vegetative Symptoms: None  Prior Inpatient Therapy Prior Inpatient Therapy: Yes Prior Therapy Dates: 2011/2004 Prior Therapy Facilty/Provider(s): [facility] Reason for Treatment: [facility]-wanted to cut wrists/[facility] use once  Prior Outpatient Therapy Prior Outpatient Therapy: No Prior Therapy Dates: n/a Prior Therapy Facilty/Provider(s): n/a Reason for Treatment: n/a  ADL Screening (condition at time of admission) Patient's cognitive ability adequate to safely complete daily activities?: Yes Patient able to express need for assistance with ADLs?: Yes Independently performs ADLs?: Yes Weakness of Legs: None Weakness of Arms/Hands: None       Abuse/Neglect Assessment (Assessment to be complete while patient is alone) Physical Abuse: Denies Verbal Abuse: Denies Sexual Abuse: Denies Exploitation of patient/patient's resources: Denies Self-Neglect: Denies Values / Beliefs Cultural Requests During Hospitalization: None Spiritual Requests During Hospitalization: None   Advance Directives (For Healthcare) Advance Directive: Patient does not have advance directive;Patient would not like information Nutrition Screen Dysphagia: No Home Tube Feeding or Total Parenteral Nutrition (TPN): No Patient appears severely malnourished: No Pregnant or Lactating: No  Additional Information 1:1  In Past 12 Months?: No CIRT Risk: No Elopement Risk: No Does patient have medical clearance?: Yes     Disposition:  Disposition Disposition of Patient: Outpatient treatment;Inpatient treatment program Type of inpatient treatment program: Adult Type of outpatient treatment: Chemical Dependence - Intensive Outpatient   On Site Evaluation by:   Reviewed with Physician:     Nyoka Lint 09/09/2011 9:37 PM

## 2011-09-09 NOTE — ED Notes (Signed)
Vital signs stable. 

## 2011-09-09 NOTE — Discharge Instructions (Signed)
Cocaine Abuse and Chemical Dependency WHEN IS DRUG USE A PROBLEM? Anytime drug use is interfering with normal living activities it has become abuse. This includes problems with family and friends. Psychological dependence has developed when your mind tells you that the drug is needed. This is usually followed by physical dependence which has developed when continuing increases of drug are required to get the same feeling or "high". This is known as addiction or chemical dependency. A person's risk is much higher if there is a history of chemical dependency in the family. SIGNS OF CHEMICAL DEPENDENCY:  Been told by friends or family that drugs have become a problem.   Fighting when using drugs.   Having blackouts (not remembering what you do while using).   Feel sick from using drugs but continue using.   Lie about use or amounts of drugs (chemicals) used.   Need chemicals to get you going.   Suffer in work Systems analyst or school because of drug use.   Get sick from use of drugs but continue to use anyway.   Need drugs to relate to people or feel comfortable in social situations.   Use drugs to forget problems.  Yes answered to any of the above signs of chemical dependency indicates there are problems. The longer the use of drugs continues, the greater the problems will become. If there is a family history of drug or alcohol use it is best not to experiment with these drugs. Experimentation leads to tolerance and needing to use more of the drug to get the same feeling. This is followed by addiction where drugs become the most important part of life. It becomes more important to take drugs than participate in the other usual activities of life including relating to friends and family. Addiction is followed by dependency where drugs are now needed not just to get high but to feel normal. Addiction cannot be cured but it can be stopped. This often requires outside help and the care of  professionals. Treatment centers are listed in the yellow pages under: Cocaine, Narcotics, and Alcoholics Anonymous. Most hospitals and clinics can refer you to a specialized care center. WHAT IS COCAINE? Cocaine is a strong nervous system stimulant which speeds up the body and gives the user the feeling that they have increased energy, loss of appetite and feelings of great pleasure. This "high" which begins within several minutes and lasts for less than an hour is followed by a "crash". The crash and depressed feelings that come with it cause a craving for the drug to regain the high. HOW IS COCANINE USED? Cocaine is snorted, injected, and smoked as free- base or crack. Because smoking the drug produces a greater high it is also associated with a greater low. It is therefore more rapidly addicting. WHAT ARE THE EFFECTS OF COCAINE? It is an anesthetic (pain killer) and a stimulant (it causes a high which gives a false feeling of well being). It increases heart and breathing rates with increases in body temperature and blood pressure. It removes appetite. It causes seizures (convulsions) along with nausea (feeling sick to your stomach), vomiting and stomach pain. This dangerous combination can lead to death. Trying to keep the high feeling leads to greater and greater drug use and this leads to addiction. Addiction can only be helped by stopping use of all chemicals. This is hard but may save your life. If the addiction is continued, the only possible outcome is loss of self respect and self esteem,  violence, death, and eventually prison if the addict is fortunate enough to be caught and able to receive help prior to this last life ending event. OTHER HEALTH RISKS OF COCAINE AND ALL DRUG USE ARE:  The increased possibility of getting AIDS or hepatitis (liver inflammation).   Having a baby born which is addicted to cocaine and must go through painful withdrawal including shaking, jerking, and crying in  pain. Many of the babies die. Other babies go through life with lifelong disabilities and learning problems.  HOW TO STAY DRUG FREE ONCE YOU HAVE QUIT USING:  Develop healthy activities and form friends who do not use drugs.   Stay away from the drug scene.   Tell the pusher or former friend you have other better things to do.   Have ready excuses available about why you cannot use.  For more help or information contact your local physician, clinic, hospital or dial 1-800-cocaine 2256536992). Document Released: 07/07/2000 Document Revised: 03/22/2011 Document Reviewed: 02/26/2008 [facility] Patient Information 2012 [place].

## 2011-09-09 NOTE — ED Notes (Signed)
MD at bedside. 

## 2011-09-09 NOTE — ED Notes (Signed)
Patient is resting comfortably. 

## 2011-09-09 NOTE — BH Assessment (Signed)
Writer called [facility] to inquire re: bed availability. Benjamine Mola stated to call back Mon during business hrs as the facility handles admissions during weekdays only.

## 2011-09-11 NOTE — Discharge Summary (Signed)
DISCHARGE SUMMARY  [name]  MR#: [id number]  DOB:14-Oct-1980  Date of Admission: 09/03/2011 Date of Discharge: 09/06/2011  Attending Physician:[name], [name]  Patient's PCP: [name]  Consults:Treatment Team:  [name]., MD,FACG  Discharge Diagnoses: .Hematemesis .CROHN'S DISEASE .ANEMIA .BIPOLAR DISORDER UNSPECIFIED .ADHD    Medication List  As of 09/11/2011  1:28 PM   ASK your doctor about these medications         albuterol 108 (90 BASE) MCG/ACT inhaler   Commonly known as: PROVENTIL HFA;VENTOLIN HFA   Inhale 2 puffs into the lungs every 4 (four) hours as needed for wheezing or shortness of breath (cough).             Hospital Course: .Hematemesis Patient was admitted to the hospital with hematemesis. Hemoglobin was stable. GI was consulted. [name] did  upper endoscopy on February 11 the result showed gastric erosions duodenitis and a small hiatal hernia. Patient was  told to avoid illicit drugs and NSAIDs and to take PPI every morning for 8 weeks. Patient stated that he could not afford Protonix. He was told to take Prilosec over-the-counter daily. He will follow up with [name]. Social worker was consulted and scheduled an appointment to follow up with [name]. He will also follow up with [name].   [name] DISEASE Patient is presently not on any medications for his Crohn's disease. He will follow up outpatient with GI.   [name] KitchenANEMIA Hemoglobin was normal and stable at the time of discharge. No further hematemesis was noted.   [name] KitchenBIPOLAR DISORDER UNSPECIFIED/.ADHD Stable during this hospitalization.  Day of Discharge BP 120/77  Pulse 53  Temp(Src) 97.8 F (36.6 C) (Oral)  Resp 18  Ht 5' 5"  (1.651 m)  Wt 47.174 kg (104 lb)  BMI 17.31 kg/m2  SpO2 97%  Physical Exam: General: Alert and oriented times three,  Eyes: PERRLA, pink conjunctiva, no scleral icterus  ENT: Moist oral mucosa, neck supple, no thyromegaly    Lungs: clear to ascultation, no wheeze, no crackles, no use of accessory muscles  Cardiovascular: regular rate and rhythm, no regurgitation, no gallops, no murmurs. No carotid bruits, no JVD  Abdomen: soft, positive BS, mild diffuse tenderness to palpation, no organomegaly, not an acute abdomen  Neuro: CN II - XII grossly intact, sensation intact  Musculoskeletal: strength 5/5 all extremities, no clubbing, cyanosis or edema  Skin: no rash, no subcutaneous crepitation, no decubitus  Psych: appropriate patient     HPI 80yoM with h/o terminal ileum Crohn's, diagnosed 2004, s/p ileocolectomy with  anastamosis, psych history with depression/agitation, self laceration, substance abuse,  presents with hematemesis.  Pt has been in usual state of Clifford including baseline minimal abd pain, until about 4d  ago when he states his abdominal pain had gotten worse, possibly localized the  epigastrium, possibly worse with eating. However, he does clearly state that he gets a  sensation of food getting stuck in his stomach when eating. He also states that this  doesn't feel like his Crohn's pain, which seems to not have been active as of late.  Earlier today, he vomitied and states it was pure bright red blood, and he was vomiting  for about 15 mins, and after it then started getting darker red and having marroon clots  in it, for which he presented to the ED. There has been no issues with his bowel  movements, no h/o diarrhea, BRBPR, or melena recently.  In the ED vitals were stable. His  initial Hct was completely normal, 43, as was the rest  of his w/u. His fecal occult blood was also negative. Dr. Maurene Capes in GI was called and  agreed to see the patient. He has been given tylenol, dilaudid, morphine, zofran, 40 mg  IV protonix, and 1L NS and maintenance IVF's.       Disposition: Home  Follow-up Appts:  [facility] March 13 at 9:30 AM.  Follow-up Information    Follow up with DEFAULT,PROVIDER,  MD .          Time spent in discharge (includes decision making & examination of pt): 45 minutes  Signed: Providencia Hottenstein JARRETT 09/11/2011, 1:28 PM

## 2012-02-10 ENCOUNTER — Emergency Department (HOSPITAL_COMMUNITY)
Admission: EM | Admit: 2012-02-10 | Discharge: 2012-02-10 | Disposition: A | Discharge status: Home / Self Care | Payer: Self-pay | Attending: Emergency Medicine | Admitting: Emergency Medicine

## 2012-02-10 ENCOUNTER — Encounter (HOSPITAL_COMMUNITY): Payer: Self-pay | Admitting: *Deleted

## 2012-02-10 ENCOUNTER — Emergency Department (HOSPITAL_COMMUNITY): Payer: Self-pay

## 2012-02-10 DIAGNOSIS — K5090 Crohn's disease, unspecified, without complications: Secondary | ICD-10-CM | POA: Insufficient documentation

## 2012-02-10 DIAGNOSIS — S9030XA Contusion of unspecified foot, initial encounter: Secondary | ICD-10-CM | POA: Insufficient documentation

## 2012-02-10 DIAGNOSIS — F319 Bipolar disorder, unspecified: Secondary | ICD-10-CM | POA: Insufficient documentation

## 2012-02-10 DIAGNOSIS — S90121A Contusion of right lesser toe(s) without damage to nail, initial encounter: Secondary | ICD-10-CM

## 2012-02-10 DIAGNOSIS — F17200 Nicotine dependence, unspecified, uncomplicated: Secondary | ICD-10-CM | POA: Insufficient documentation

## 2012-02-10 DIAGNOSIS — W2209XA Striking against other stationary object, initial encounter: Secondary | ICD-10-CM | POA: Insufficient documentation

## 2012-02-10 DIAGNOSIS — I10 Essential (primary) hypertension: Secondary | ICD-10-CM | POA: Insufficient documentation

## 2012-02-10 DIAGNOSIS — J45909 Unspecified asthma, uncomplicated: Secondary | ICD-10-CM | POA: Insufficient documentation

## 2012-02-10 MED ORDER — IBUPROFEN 600 MG PO TABS: 600.0000 mg | ORAL_TABLET | Freq: Three times a day (TID) | ORAL | Status: AC | PRN

## 2012-02-10 MED ORDER — HYDROCODONE-ACETAMINOPHEN 5-325 MG PO TABS: 1.0000 | ORAL_TABLET | ORAL | Status: AC | PRN

## 2012-02-10 MED ORDER — HYDROCODONE-ACETAMINOPHEN 5-325 MG PO TABS
1.0000 | ORAL_TABLET | Freq: Once | ORAL | Status: AC
  Administered 2012-02-10: 1 via ORAL
  Filled 2012-02-10: qty 1

## 2012-02-10 NOTE — ED Notes (Signed)
Patient transported to X-ray 

## 2012-02-10 NOTE — ED Provider Notes (Signed)
History     CSN: [id number]  Arrival date & time 02/10/12  1357   First MD Initiated Contact with Patient 02/10/12 1406      Chief Complaint  Patient presents with  . Toe Injury    (Consider location/radiation/quality/duration/timing/severity/associated sxs/prior treatment) HPI Comments: [name] sustained injury to his right great and second toe after kicking a television prior to arrival. Pain is constant, sharp, but worse with attempts at ROM.  There is no radiation of pain.  He has had no treatment prior to arrival.  He denies numbness in his toes.  The history is provided by the patient.    Past Medical History  Diagnosis Date  . Crohn disease Dx 2004    terminal ileum Crohn's s/p ileocolectomy with anastamosis  . Bipolar 1 disorder     Self laceration and self harm behaviors   . Substance abuse   . Depression   . Anxiety   . Asthma     mild   . Hypertension     Past Surgical History  Procedure Date  . Colon surgery 01/17/2006  . Inguinal hernia repair     On the right and the left   . Tonsillectomy     20 yrs ago  . Esophagogastroduodenoscopy 09/04/2011    Procedure: ESOPHAGOGASTRODUODENOSCOPY (EGD);  Surgeon: Estanislado Emms., MD,FACG;  Location: [facility];  Service: Endoscopy;  Laterality: N/A;    Family History  Problem Relation Age of Onset  . Hypertension Other   . Diabetes Other     History  Substance Use Topics  . Smoking status: Current Everyday Smoker -- 0.5 packs/day    Types: Cigarettes  . Smokeless tobacco: Never Used  . Alcohol Use: Yes     occsionally      Review of Systems  Musculoskeletal: Positive for joint swelling and arthralgias.  Skin: Negative for wound.  Neurological: Negative for weakness and numbness.    Allergies  Penicillins and Diphenhydramine hcl  Home Medications   Current Outpatient Rx  Name Route Sig Dispense Refill  . ALBUTEROL SULFATE HFA 108 (90 BASE) MCG/ACT IN AERS Inhalation Inhale 2  puffs into the lungs every 4 (four) hours as needed for wheezing or shortness of breath (cough). 1 Inhaler 0  . ALPRAZOLAM 1 MG PO TABS Oral Take 1 mg by mouth 2 (two) times daily.    . AMPHETAMINE-DEXTROAMPHETAMINE 20 MG PO TABS Oral Take 20 mg by mouth 2 (two) times daily.    [name] Kitchen HYDROCODONE-ACETAMINOPHEN 5-325 MG PO TABS Oral Take 1 tablet by mouth every 4 (four) hours as needed for pain. 10 tablet 0  . IBUPROFEN 600 MG PO TABS Oral Take 1 tablet (600 mg total) by mouth every 8 (eight) hours as needed for pain. 20 tablet 0  . OMEPRAZOLE 40 MG PO CPDR Oral Take 1 capsule (40 mg total) by mouth daily. 30 capsule 1    BP 133/92  Pulse 94  Temp 98.1 F (36.7 C) (Oral)  Resp 20  Ht 5' 5"  (1.651 m)  Wt 115 lb (52.164 kg)  BMI 19.14 kg/m2  SpO2 100%  Physical Exam  Constitutional: He appears well-developed and well-nourished.  HENT:  Head: Atraumatic.  Neck: Normal range of motion.  Cardiovascular:       Pulses equal bilaterally  Musculoskeletal: He exhibits edema and tenderness.       Right foot: He exhibits tenderness and swelling.       Feet:  Neurological: He is alert. He  has normal strength. He displays normal reflexes. No sensory deficit.       Equal strength  Skin: Skin is warm and dry.  Psychiatric: He has a normal mood and affect.    ED Course  Procedures (including critical care time)  Labs Reviewed - No data to display Dg Foot Complete Right  02/10/2012  *RADIOLOGY REPORT*  Clinical Data: Pain in the great toe.  RIGHT FOOT COMPLETE - 3+ VIEW  Comparison: No priors.  Findings: Three views of the right foot demonstrate no acute fracture, subluxation, dislocation, joint or soft tissue abnormality.  IMPRESSION: 1.  No acute radiographic abnormality of the right foot.  Original Report Authenticated By: Etheleen Mayhew, M.D.     1. Contusion of toe of right foot       MDM  xrays reviewed prior to dc home.  Placed in post op shoe,  Ice pack given,  Encouraged rest,   Ice and elevation.  Hydrocodone and ibuprofen prescribed.          Evalee Jefferson, [place] 02/10/12 1709

## 2012-02-10 NOTE — ED Notes (Signed)
Pt here for right great toe pain after kicking a TV this morning, noted knuckles to right hand with abrasions, when asked about it , pt stated that he hit a wall, denies wanting treatment for hand, friend in room also stated that pt was feeling dehydrated due to not drinking enough fluids, water offered and accepted

## 2012-02-10 NOTE — ED Notes (Signed)
Pt c/o pain in his right great and second toes after kicking a TV this morning. Pt states that he is unable to move his great toe.

## 2012-02-11 NOTE — ED Provider Notes (Signed)
Medical screening examination/treatment/procedure(s) were performed by non-physician practitioner and as supervising physician I was immediately available for consultation/collaboration.   Alfonzo Feller, DO 02/11/12 [id number]

## 2012-04-21 ENCOUNTER — Emergency Department (HOSPITAL_COMMUNITY)
Admission: EM | Admit: 2012-04-21 | Discharge: 2012-04-21 | Disposition: A | Discharge status: Home / Self Care | Payer: Self-pay | Attending: Emergency Medicine | Admitting: Emergency Medicine

## 2012-04-21 ENCOUNTER — Encounter (HOSPITAL_COMMUNITY): Payer: Self-pay | Admitting: Emergency Medicine

## 2012-04-21 ENCOUNTER — Emergency Department (HOSPITAL_COMMUNITY): Payer: Self-pay

## 2012-04-21 DIAGNOSIS — IMO0002 Reserved for concepts with insufficient information to code with codable children: Secondary | ICD-10-CM | POA: Insufficient documentation

## 2012-04-21 DIAGNOSIS — S82891A Other fracture of right lower leg, initial encounter for closed fracture: Secondary | ICD-10-CM

## 2012-04-21 DIAGNOSIS — Y9383 Activity, rough housing and horseplay: Secondary | ICD-10-CM | POA: Insufficient documentation

## 2012-04-21 DIAGNOSIS — S9000XA Contusion of unspecified ankle, initial encounter: Secondary | ICD-10-CM | POA: Insufficient documentation

## 2012-04-21 MED ORDER — OXYCODONE-ACETAMINOPHEN 5-325 MG PO TABS: 1.0000 | ORAL_TABLET | ORAL | Status: AC | PRN

## 2012-04-21 MED ORDER — OXYCODONE-ACETAMINOPHEN 5-325 MG PO TABS
2.0000 | ORAL_TABLET | Freq: Once | ORAL | Status: AC
  Administered 2012-04-21: 2 via ORAL
  Filled 2012-04-21: qty 2

## 2012-04-21 NOTE — ED Notes (Signed)
Patient with no complaints at this time. Respirations even and unlabored. Skin warm/dry. Discharge instructions reviewed with patient at this time. Patient given opportunity to voice concerns/ask questions. Patient discharged at this time and left Emergency Department with steady gait.   

## 2012-04-21 NOTE — Discharge Instructions (Signed)
Bimalleolar Fracture, Ankle, Adult, Undisplaced You have two fractures (break in bone) in the lower bones of your leg that help to make up your ankle. These fractures are in the bone you feel as the bump on the outside of your ankle (fibula) and the bone that you feel as the bump on the inside of your ankle (tibia). Your fractures are not displaced. This means the bones are in their normal position and should give a good result if they heal in that position. Sometimes an open reduction and internal fixation with screws or pins may be used if the fracture seems unstable or if the position of the bone fragments (break apart) later is lost. Even with the best of care and perfect results this ankle may be more prone to be arthritic later. These fractures are easily diagnosed with X-rays. TREATMENT  A short-leg cast is then applied from your toes to below your knee. This is generally left in place for about 5 to 6 weeks, during which time it is followed by your caregiver and X-rays may be taken to make sure the bones stay in place. HOME CARE INSTRUCTIONS   Do not drive a vehicle until your caregiver specifically tells you it is safe to do so.   When home following surgery, apply ice to the area of injury for 15 to 20 minutes, 3 to 4 times per day while awake, for 2 days. Put the ice in a plastic bag and place a thin towel between the bag of ice and your cast.   If you have a plaster or fiberglass cast:   Do not try to scratch the skin under the cast using sharp or pointed objects.   Check the skin around the cast every day. You may put lotion on any red or sore areas.   Keep your cast dry and clean.   Do not put pressure on any part of your cast or splint until it is fully hardened.   Your cast or splint can be protected during bathing with a plastic bag. Do not lower the cast or splint into water.   Use crutches as directed. These are not fractures to be taken lightly! If these bones become  displaced and get out of position, it may eventually lead to arthritis and disability for the rest of your life. Problems often follow even the best of care. Follow the directions of your caregiver.   Only take over-the-counter or prescription medicines for pain, discomfort, or fever as directed by your caregiver.  SEEK MEDICAL CARE IF:   Pain is becoming worse rather than better or if pain is uncontrolled with medications.   You have increased swelling, redness, or numbness in the foot.  SEEK IMMEDIATE MEDICAL CARE IF:   Your skin or nails below the injury turn blue or gray, or feel cold or numb.   You develop severe pain under the cast or in your foot.  Follow all instructions given to you by your caregiver, make and keep follow-up appointments, and use crutches as directed. MAKE SURE YOU:   Understand these instructions.   Will watch your condition.   Will get help right away if you are not doing well or get worse.  Document Released: 04/01/2002 Document Revised: 06/29/2011 Document Reviewed: 02/12/2008 [facility] Patient Information 2012 [place].

## 2012-04-21 NOTE — ED Provider Notes (Signed)
History     CSN: [id number]  Arrival date & time 04/21/12  1251   First MD Initiated Contact with Patient 04/21/12 1422      Chief Complaint  Patient presents with  . Ankle Pain    (Consider location/radiation/quality/duration/timing/severity/associated sxs/prior treatment) HPI Comments: Patient complains of pain to his lateral right ankle for 24 hours. He states pain began while he was" wrestling" with someone and that person fell on his ankle. He also complains of moderate swelling to his ankle and inability to bear weight. Pain improves slightly with elevation and ice. He denies proximal tenderness, numbness, or weakness to his leg.  Patient is a 31 y.o. male presenting with ankle pain. The history is provided by the patient.  Ankle Pain  Incident onset: Evening prior to ED arrival. The incident occurred at home. The pain is present in the right ankle. The quality of the pain is described as throbbing and aching. The pain is moderate. The pain has been constant since onset. Associated symptoms include inability to bear weight. Pertinent negatives include no numbness, no loss of motion, no muscle weakness, no loss of sensation and no tingling. He reports no foreign bodies present. The symptoms are aggravated by activity, bearing weight and palpation. He has tried ice and elevation for the symptoms. The treatment provided no relief.    Past Medical History  Diagnosis Date  . Crohn disease Dx 2004    terminal ileum Crohn's s/p ileocolectomy with anastamosis  . Bipolar 1 disorder     Self laceration and self harm behaviors   . Substance abuse   . Depression   . Anxiety   . Asthma     mild   . Hypertension     Past Surgical History  Procedure Date  . Colon surgery 01/17/2006  . Inguinal hernia repair     On the right and the left   . Tonsillectomy     20 yrs ago  . Esophagogastroduodenoscopy 09/04/2011    Procedure: ESOPHAGOGASTRODUODENOSCOPY (EGD);  Surgeon: Estanislado Emms., MD,FACG;  Location: [facility];  Service: Endoscopy;  Laterality: N/A;    Family History  Problem Relation Age of Onset  . Hypertension Other   . Diabetes Other     History  Substance Use Topics  . Smoking status: Current Every Day Smoker -- 0.5 packs/day    Types: Cigarettes  . Smokeless tobacco: Never Used  . Alcohol Use: Yes     occsionally      Review of Systems  Constitutional: Negative for fever and chills.  Genitourinary: Negative for dysuria and difficulty urinating.  Musculoskeletal: Positive for joint swelling, arthralgias and gait problem. Negative for back pain.  Skin: Negative for color change and wound.  Neurological: Negative for tingling, weakness, numbness and headaches.  All other systems reviewed and are negative.    Allergies  Penicillins and Diphenhydramine hcl  Home Medications   Current Outpatient Rx  Name Route Sig Dispense Refill  . ALBUTEROL SULFATE HFA 108 (90 BASE) MCG/ACT IN AERS Inhalation Inhale 2 puffs into the lungs every 4 (four) hours as needed for wheezing or shortness of breath (cough). 1 Inhaler 0  . ALPRAZOLAM 1 MG PO TABS Oral Take 1 mg by mouth 2 (two) times daily.    . AMPHETAMINE-DEXTROAMPHETAMINE 20 MG PO TABS Oral Take 20 mg by mouth 2 (two) times daily.    [name] Kitchen OMEPRAZOLE 40 MG PO CPDR Oral Take 1 capsule (40 mg total) by mouth daily. [place]  capsule 1    BP 122/83  Pulse 102  Temp 98.8 F (37.1 C)  Resp 20  Ht 5' 5"  (1.651 m)  Wt 100 lb (45.36 kg)  BMI 16.64 kg/m2  SpO2 99%  Physical Exam  Nursing note and vitals reviewed. Constitutional: He is oriented to person, place, and time. He appears well-developed and well-nourished. No distress.  HENT:  Head: Normocephalic and atraumatic.  Neck: Normal range of motion. Neck supple.  Cardiovascular: Normal rate, regular rhythm, normal heart sounds and intact distal pulses.   Pulmonary/Chest: Effort normal and breath sounds normal.  Musculoskeletal: He exhibits edema  and tenderness.       Right ankle: He exhibits decreased range of motion, swelling and ecchymosis. He exhibits no deformity, no laceration and normal pulse. tenderness. Lateral malleolus tenderness found. No posterior TFL, no head of 5th metatarsal and no proximal fibula tenderness found. Achilles tendon normal.       Feet:       Lateral Right ankle ankle is ttp, moderate STS is present.  Patient is able to move his toes but unable to dorsiflex or plantar flex from the ankle due to level of pain. DP pulse is brisk, sensation intact.  Mild erythema and bruising is present. No proximal tenderness on exam.  Neurological: He is alert and oriented to person, place, and time. He exhibits normal muscle tone. Coordination normal.  Skin: Skin is warm and dry.    ED Course  Procedures (including critical care time)  Labs Reviewed - No data to display Dg Tibia/fibula Right  04/21/2012  *RADIOLOGY REPORT*  Clinical Data: Ankle fracture, pain  RIGHT TIBIA AND FIBULA - 2 VIEW  Comparison: Ankle series earlier today.  Findings: Oblique lateral malleolar fracture redemonstrated. Slight posterior malleolar fracture also present. Both are not significantly displaced.   No proximal or mid fibular or tibial fractures.  IMPRESSION:  Lateral and posterior malleolar fractures as described.   Original Report Authenticated By: Staci Righter, M.D.    Dg Ankle Complete Right  04/21/2012  *RADIOLOGY REPORT*  Clinical Data:  Lateral ankle pain  RIGHT ANKLE - COMPLETE 3+ VIEW  Comparison: Prior radiographs of the right foot 02/10/2012  Findings: Obliquely oriented, minimally displaced fracture through the lateral malleolus at the level of the tibial plafond.  On the lateral view, there appears to be focal disruption of the posterior cortex of the distal tibial metaphysis consistent with a nondisplaced posterior malleolar fracture.  There is an ankle joint effusion as well as diffuse infiltration of Kager's fat pad.  The ankle  mortise appears congruent.  No significant widening of the tibiofibular clear space.  Extensive soft tissue swelling about the lateral ankle.  IMPRESSION:  1.  Acute minimally displaced obliquely oriented fracture through the lateral malleolus at the level of the tibial plafond 2.  Nondisplaced fracture through the posterior cortex of the distal tibial metaphysis 3.  Extensive soft tissue swelling, and ankle joint effusion  Recommend additional radiographs of the tib-fib to evaluate the proximal fibula.   Original Report Authenticated By: HEATH      Cam walker applied, pain improved, remains NV intact   MDM    Closed bi-mall fx.  DP pulses are brisk bilaterally.  No proximal tenderness or edema.  Distal sensation intact.  Pt requests referral to [facility] orthopedics.   The patient appears reasonably screened and/or stabilized for discharge and I doubt any other medical condition or other [facility] requiring further screening, evaluation, or treatment in the  ED at this time prior to discharge.    Prescribed:  Percocet #[id number]     Kaelen Caughlin L. [place], [place] 04/23/12 1735

## 2012-04-21 NOTE — ED Notes (Signed)
Pt c/o right ankle pain/swelling since hurting it while play wrestling last night.

## 2012-04-25 NOTE — ED Provider Notes (Signed)
Medical screening examination/treatment/procedure(s) were performed by non-physician practitioner and as supervising physician I was immediately available for consultation/collaboration.  Virgel Manifold, MD 04/25/12 1051

## 2012-05-02 ENCOUNTER — Emergency Department (HOSPITAL_COMMUNITY)
Admission: EM | Admit: 2012-05-02 | Discharge: 2012-05-02 | Disposition: A | Discharge status: Home / Self Care | Payer: Self-pay | Attending: Emergency Medicine | Admitting: Emergency Medicine

## 2012-05-02 ENCOUNTER — Encounter (HOSPITAL_COMMUNITY): Payer: Self-pay

## 2012-05-02 DIAGNOSIS — S8990XA Unspecified injury of unspecified lower leg, initial encounter: Secondary | ICD-10-CM | POA: Insufficient documentation

## 2012-05-02 DIAGNOSIS — K5090 Crohn's disease, unspecified, without complications: Secondary | ICD-10-CM | POA: Insufficient documentation

## 2012-05-02 DIAGNOSIS — F319 Bipolar disorder, unspecified: Secondary | ICD-10-CM | POA: Insufficient documentation

## 2012-05-02 DIAGNOSIS — F17200 Nicotine dependence, unspecified, uncomplicated: Secondary | ICD-10-CM | POA: Insufficient documentation

## 2012-05-02 DIAGNOSIS — F411 Generalized anxiety disorder: Secondary | ICD-10-CM | POA: Insufficient documentation

## 2012-05-02 DIAGNOSIS — M79606 Pain in leg, unspecified: Secondary | ICD-10-CM

## 2012-05-02 DIAGNOSIS — Y9210 Unspecified residential institution as the place of occurrence of the external cause: Secondary | ICD-10-CM | POA: Insufficient documentation

## 2012-05-02 DIAGNOSIS — F1910 Other psychoactive substance abuse, uncomplicated: Secondary | ICD-10-CM | POA: Insufficient documentation

## 2012-05-02 DIAGNOSIS — Z888 Allergy status to other drugs, medicaments and biological substances status: Secondary | ICD-10-CM | POA: Insufficient documentation

## 2012-05-02 DIAGNOSIS — J45909 Unspecified asthma, uncomplicated: Secondary | ICD-10-CM | POA: Insufficient documentation

## 2012-05-02 DIAGNOSIS — I10 Essential (primary) hypertension: Secondary | ICD-10-CM | POA: Insufficient documentation

## 2012-05-02 DIAGNOSIS — W19XXXA Unspecified fall, initial encounter: Secondary | ICD-10-CM | POA: Insufficient documentation

## 2012-05-02 DIAGNOSIS — S99919A Unspecified injury of unspecified ankle, initial encounter: Secondary | ICD-10-CM | POA: Insufficient documentation

## 2012-05-02 MED ORDER — OXYCODONE-ACETAMINOPHEN 5-325 MG PO TABS
1.0000 | ORAL_TABLET | Freq: Once | ORAL | Status: AC
  Administered 2012-05-02: 1 via ORAL
  Filled 2012-05-02: qty 1

## 2012-05-02 MED ORDER — OXYCODONE-ACETAMINOPHEN 5-325 MG PO TABS: 1.0000 | ORAL_TABLET | Freq: Four times a day (QID) | ORAL | Status: DC | PRN

## 2012-05-02 NOTE — ED Provider Notes (Signed)
History     CSN: [id number]  Arrival date & time 05/02/12  5277   First MD Initiated Contact with Patient 05/02/12 0200      Chief Complaint  Patient presents with  . Ankle Pain    (Consider location/radiation/quality/duration/timing/severity/associated sxs/prior treatment) Patient is a 31 y.o. male presenting with ankle pain. The history is provided by the patient (pt has a broken leg and had been on percocet.  was switched to vicodin and  he has no relief o the pain). No language interpreter was used.  Ankle Pain  The incident occurred more than 2 days ago. The incident occurred at home. The injury mechanism was a fall. Pain location: entire left ankle. The quality of the pain is described as aching. The pain is at a severity of 5/10. The pain is moderate. The pain has been constant since onset. Pertinent negatives include no numbness. He reports no foreign bodies present. He has tried immobilization for the symptoms. The treatment provided moderate relief.    Past Medical History  Diagnosis Date  . Crohn disease Dx 2004    terminal ileum Crohn's s/p ileocolectomy with anastamosis  . Bipolar 1 disorder     Self laceration and self harm behaviors   . Substance abuse   . Depression   . Anxiety   . Asthma     mild   . Hypertension     Past Surgical History  Procedure Date  . Colon surgery 01/17/2006  . Inguinal hernia repair     On the right and the left   . Tonsillectomy     20 yrs ago  . Esophagogastroduodenoscopy 09/04/2011    Procedure: ESOPHAGOGASTRODUODENOSCOPY (EGD);  Surgeon: Estanislado Emms., MD,FACG;  Location: [facility];  Service: Endoscopy;  Laterality: N/A;    Family History  Problem Relation Age of Onset  . Hypertension Other   . Diabetes Other     History  Substance Use Topics  . Smoking status: Current Every Day Smoker -- 0.5 packs/day    Types: Cigarettes  . Smokeless tobacco: Never Used  . Alcohol Use: Yes     occsionally      Review  of Systems  Constitutional: Negative for fatigue.  HENT: Negative for congestion, sinus pressure and ear discharge.   Eyes: Negative for discharge.  Respiratory: Negative for cough.   Cardiovascular: Negative for chest pain.  Gastrointestinal: Negative for abdominal pain and diarrhea.  Genitourinary: Negative for frequency and hematuria.  Musculoskeletal: Negative for back pain.       Left leg pain  Skin: Negative for rash.  Neurological: Negative for seizures, numbness and headaches.  Hematological: Negative.   Psychiatric/Behavioral: Negative for hallucinations.    Allergies  Penicillins and Diphenhydramine hcl  Home Medications   Current Outpatient Rx  Name Route Sig Dispense Refill  . ALBUTEROL SULFATE HFA 108 (90 BASE) MCG/ACT IN AERS Inhalation Inhale 2 puffs into the lungs every 4 (four) hours as needed for wheezing or shortness of breath (cough). 1 Inhaler 0  . OMEPRAZOLE 40 MG PO CPDR Oral Take 1 capsule (40 mg total) by mouth daily. 30 capsule 1  . ALPRAZOLAM 1 MG PO TABS Oral Take 1 mg by mouth 2 (two) times daily.    . AMPHETAMINE-DEXTROAMPHETAMINE 20 MG PO TABS Oral Take 20 mg by mouth 2 (two) times daily.    . OXYCODONE-ACETAMINOPHEN 5-325 MG PO TABS Oral Take 1 tablet by mouth every 4 (four) hours as needed for pain. 24 tablet 0  .  OXYCODONE-ACETAMINOPHEN 5-325 MG PO TABS Oral Take 1 tablet by mouth every 6 (six) hours as needed for pain. 30 tablet 0    BP 128/85  Pulse 89  Temp 97 F (36.1 C) (Oral)  Resp 18  Ht 5' 5"  (1.651 m)  Wt 105 lb (47.628 kg)  BMI 17.47 kg/m2  SpO2 96%  Physical Exam  Constitutional: He is oriented to person, place, and time. He appears well-developed.  HENT:  Head: Normocephalic.  Eyes: Conjunctivae normal are normal.  Neck: No tracheal deviation present.  Cardiovascular:  No murmur heard. Musculoskeletal:       Short leg cast on left ankle.  Good cap refill to toes.  Distal foot normal  Neurological: He is oriented to  person, place, and time.  Skin: Skin is warm.  Psychiatric: He has a normal mood and affect.    ED Course  Procedures (including critical care time)  Labs Reviewed - No data to display No results found.   1. Leg pain       MDM          Maudry Diego, MD 05/02/12 [phone]

## 2012-05-02 NOTE — ED Notes (Signed)
Pt has a fracture with a cast, seen by orthopedist at [facility] on Monday, states changed from percocet to vicodin and that the pain meds are not working.

## 2012-08-09 IMAGING — CR DG CHEST 2V
2 series · 2 of 2 positions shown · non-contrast
Comparison: None

CLINICAL DATA: Cough and congestion

CHEST - 2 VIEW

[w chest pa]
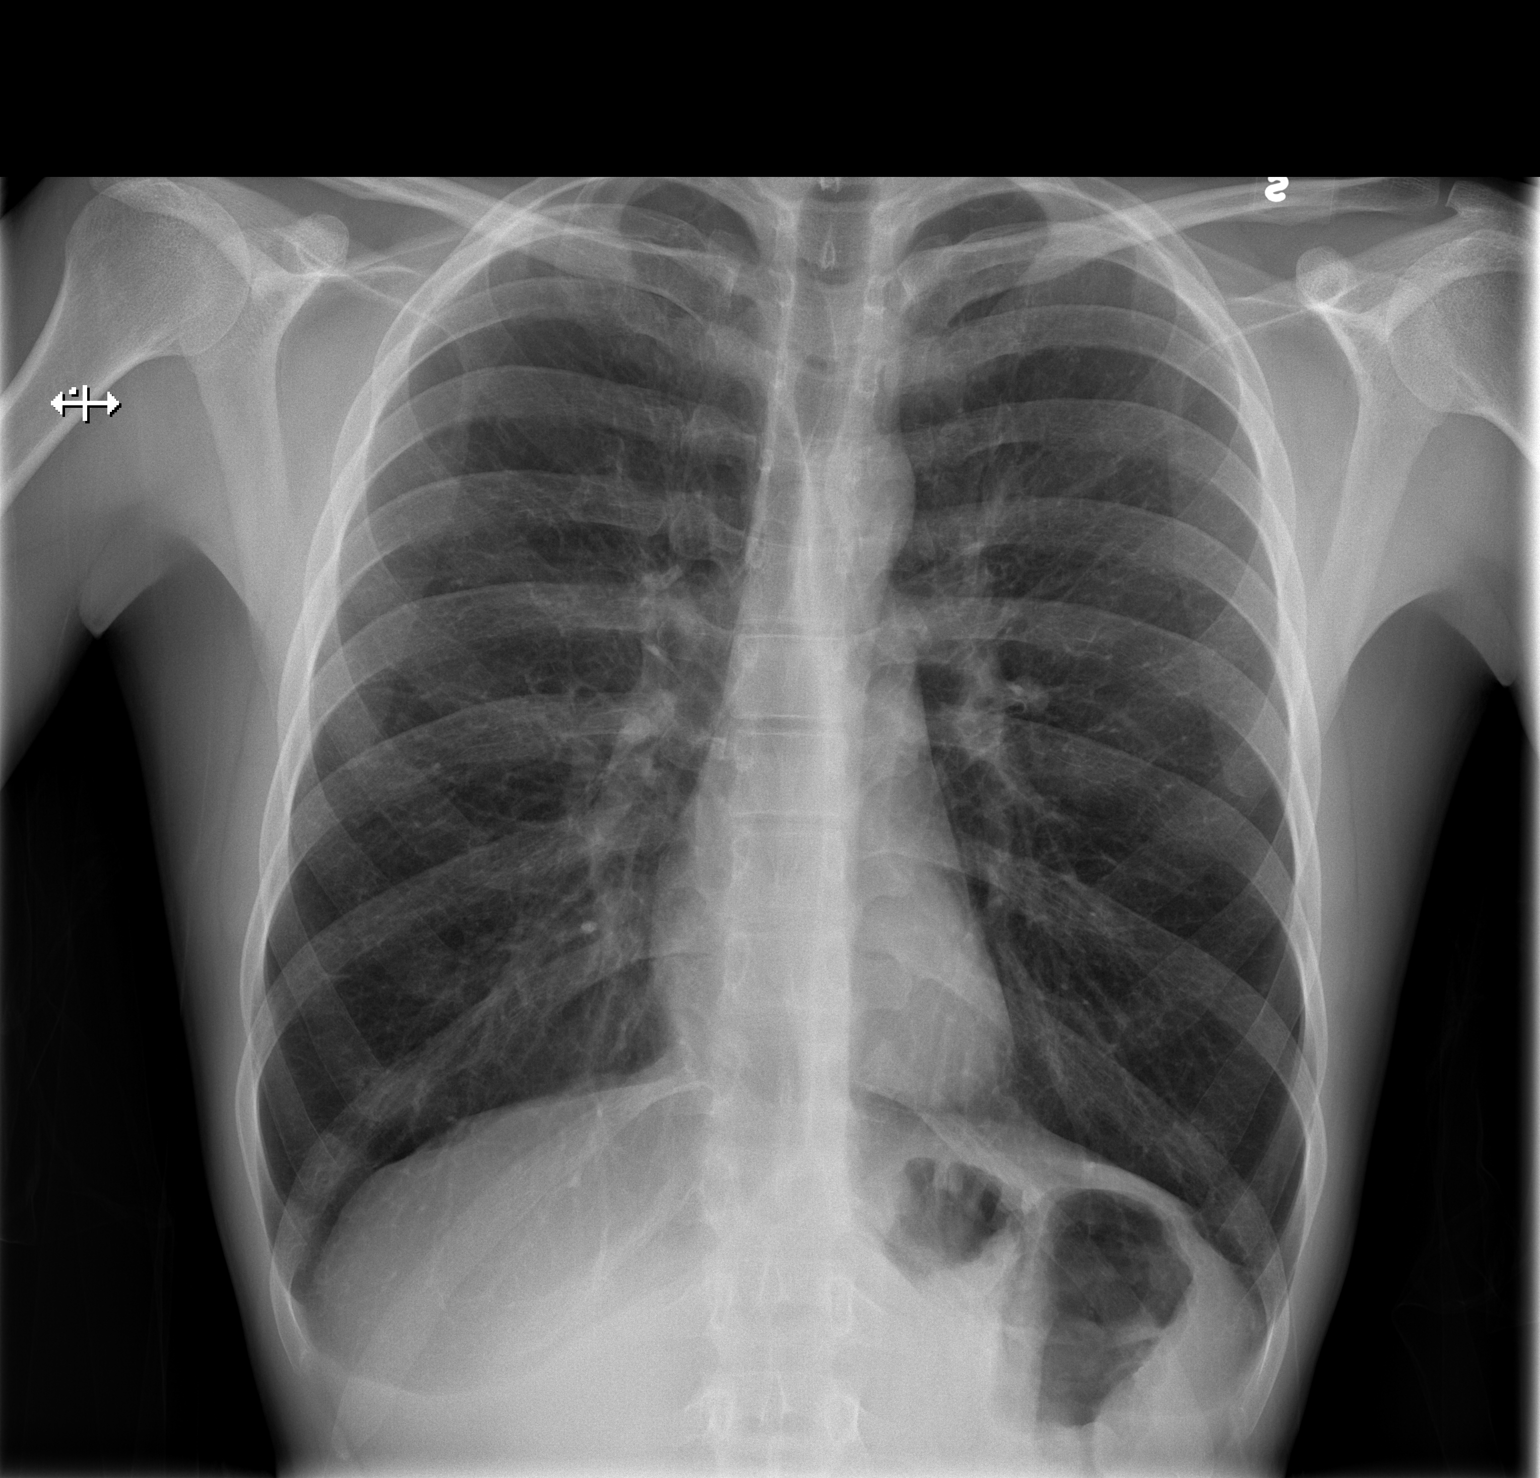

[w chest lat]
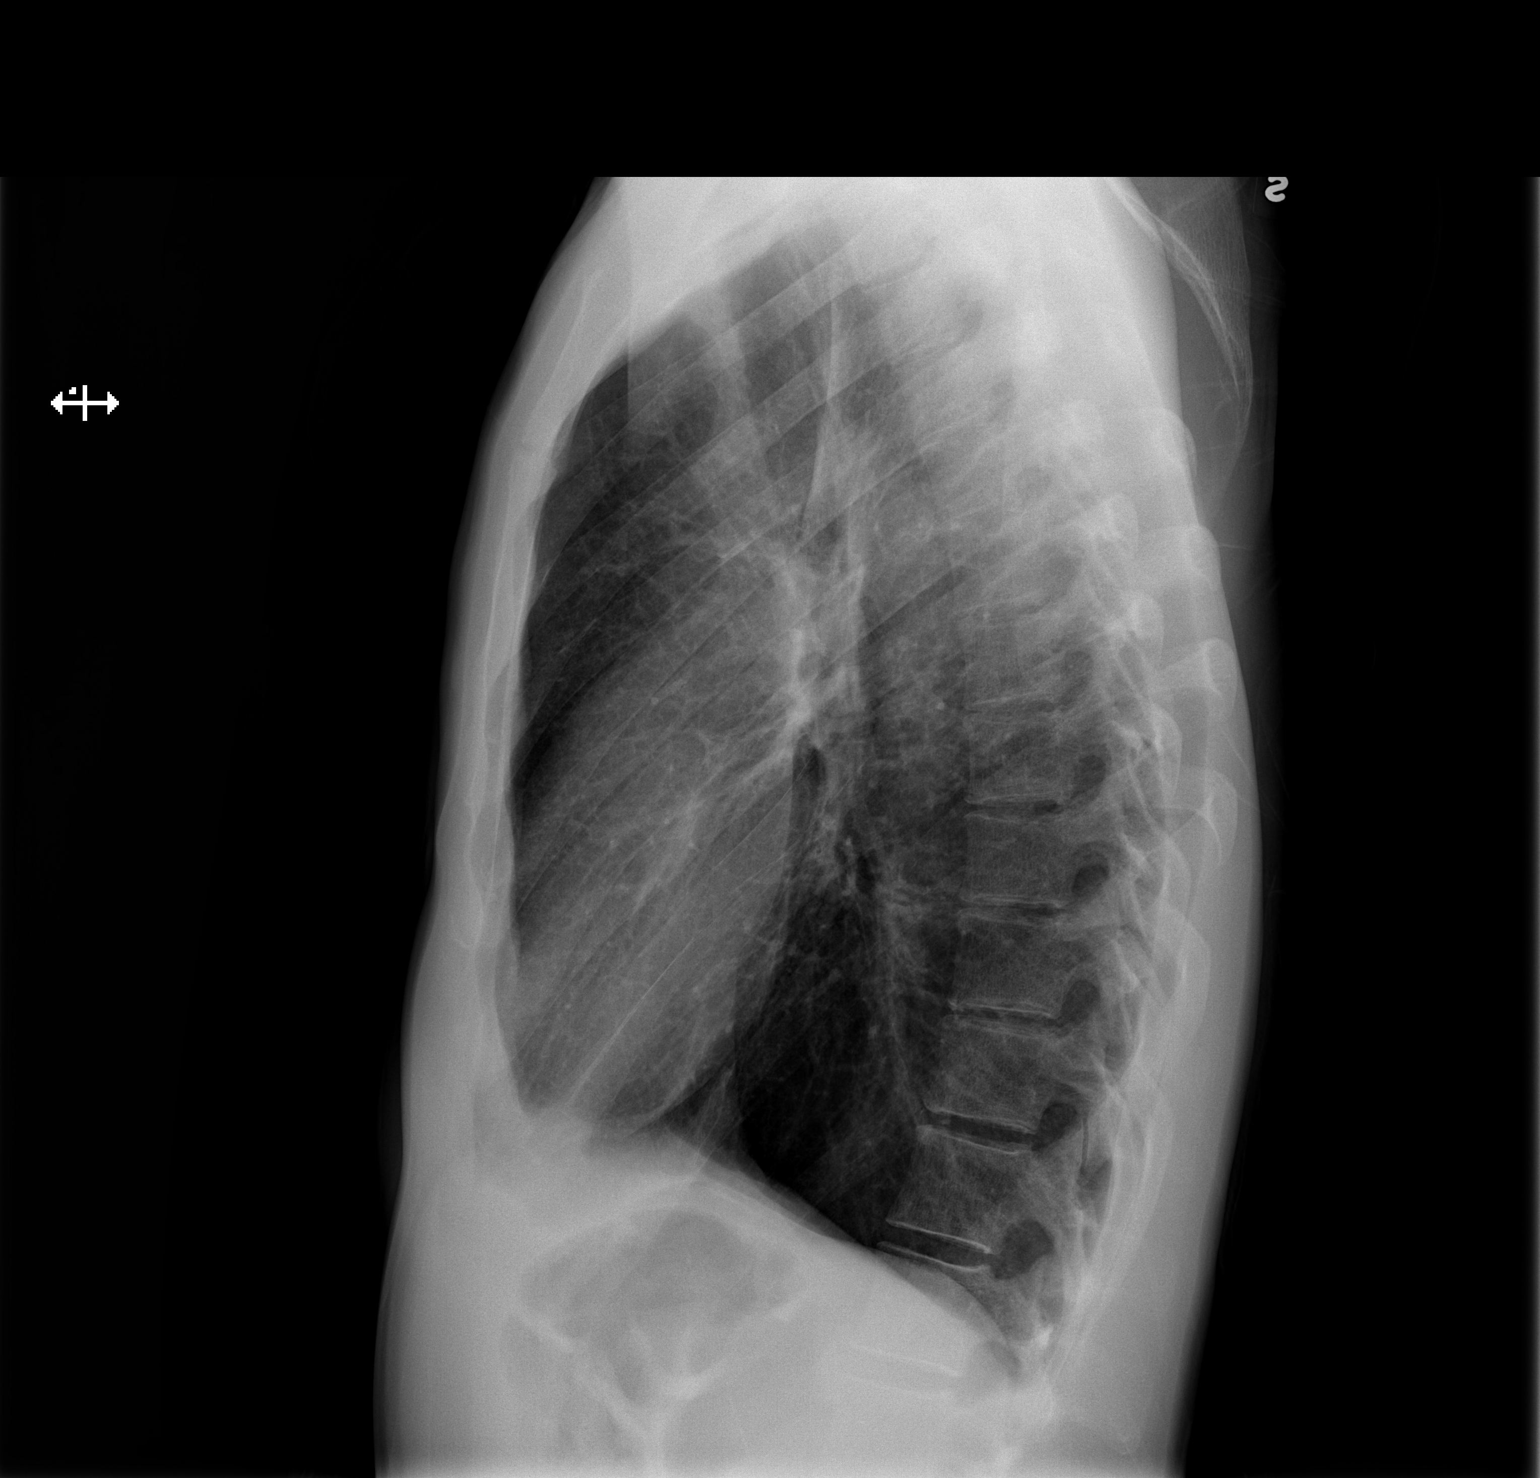

[2 of 2 positions shown; findings below may reference images not displayed]

FINDINGS: Normal heart, mediastinal, and hilar contours.  Lungs are
well expanded and clear. Negative for airspace disease or pleural
effusion.  No acute bony abnormality.
IMPRESSION: No acute cardiopulmonary disease.

## 2012-12-23 ENCOUNTER — Emergency Department (HOSPITAL_COMMUNITY): Payer: Self-pay

## 2012-12-23 ENCOUNTER — Encounter (HOSPITAL_COMMUNITY): Payer: Self-pay | Admitting: *Deleted

## 2012-12-23 ENCOUNTER — Emergency Department (HOSPITAL_COMMUNITY)
Admission: EM | Admit: 2012-12-23 | Discharge: 2012-12-23 | Disposition: A | Discharge status: Home / Self Care | Payer: Self-pay | Attending: Emergency Medicine | Admitting: Emergency Medicine

## 2012-12-23 DIAGNOSIS — R109 Unspecified abdominal pain: Secondary | ICD-10-CM | POA: Insufficient documentation

## 2012-12-23 DIAGNOSIS — K5090 Crohn's disease, unspecified, without complications: Secondary | ICD-10-CM | POA: Insufficient documentation

## 2012-12-23 DIAGNOSIS — F411 Generalized anxiety disorder: Secondary | ICD-10-CM | POA: Insufficient documentation

## 2012-12-23 DIAGNOSIS — I10 Essential (primary) hypertension: Secondary | ICD-10-CM | POA: Insufficient documentation

## 2012-12-23 DIAGNOSIS — F17200 Nicotine dependence, unspecified, uncomplicated: Secondary | ICD-10-CM | POA: Insufficient documentation

## 2012-12-23 DIAGNOSIS — R112 Nausea with vomiting, unspecified: Secondary | ICD-10-CM | POA: Insufficient documentation

## 2012-12-23 DIAGNOSIS — J45909 Unspecified asthma, uncomplicated: Secondary | ICD-10-CM | POA: Insufficient documentation

## 2012-12-23 DIAGNOSIS — F3289 Other specified depressive episodes: Secondary | ICD-10-CM | POA: Insufficient documentation

## 2012-12-23 DIAGNOSIS — F1910 Other psychoactive substance abuse, uncomplicated: Secondary | ICD-10-CM | POA: Insufficient documentation

## 2012-12-23 DIAGNOSIS — F329 Major depressive disorder, single episode, unspecified: Secondary | ICD-10-CM | POA: Insufficient documentation

## 2012-12-23 LAB — LIPASE, BLOOD: Lipase: 30 U/L (ref 11–59)

## 2012-12-23 LAB — CBC WITH DIFFERENTIAL/PLATELET
Basophils Absolute: 0.1 10*3/uL (ref 0.0–0.1)
Basophils Relative: 1 % (ref 0–1)
Eosinophils Absolute: 0.6 10*3/uL (ref 0.0–0.7)
Eosinophils Relative: 6 % — ABNORMAL HIGH (ref 0–5)
Lymphs Abs: 2.3 10*3/uL (ref 0.7–4.0)
MCH: 32.9 pg (ref 26.0–34.0)
Neutrophils Relative %: 63 % (ref 43–77)
Platelets: 223 10*3/uL (ref 150–400)
RBC: 4.56 MIL/uL (ref 4.22–5.81)
RDW: 12.5 % (ref 11.5–15.5)
WBC: 10.4 10*3/uL (ref 4.0–10.5)

## 2012-12-23 LAB — COMPREHENSIVE METABOLIC PANEL
ALT: 11 U/L (ref 0–53)
AST: 17 U/L (ref 0–37)
Albumin: 3.5 g/dL (ref 3.5–5.2)
Alkaline Phosphatase: 100 U/L (ref 39–117)
Calcium: 9.5 mg/dL (ref 8.4–10.5)
Glucose, Bld: 89 mg/dL (ref 70–99)
Potassium: 3.5 mEq/L (ref 3.5–5.1)
Sodium: 139 mEq/L (ref 135–145)
Total Protein: 7 g/dL (ref 6.0–8.3)

## 2012-12-23 MED ORDER — IOHEXOL 300 MG/ML  SOLN
50.0000 mL | Freq: Once | INTRAMUSCULAR | Status: AC | PRN
  Administered 2012-12-23: 50 mL via ORAL

## 2012-12-23 MED ORDER — SODIUM CHLORIDE 0.9 % IV BOLUS (SEPSIS)
1000.0000 mL | Freq: Once | INTRAVENOUS | Status: AC
  Administered 2012-12-23: 1000 mL via INTRAVENOUS

## 2012-12-23 MED ORDER — ONDANSETRON HCL 4 MG/2ML IJ SOLN
4.0000 mg | Freq: Once | INTRAMUSCULAR | Status: AC
  Administered 2012-12-23: 4 mg via INTRAVENOUS
  Filled 2012-12-23: qty 2

## 2012-12-23 MED ORDER — DICYCLOMINE HCL 10 MG PO CAPS
20.0000 mg | ORAL_CAPSULE | Freq: Once | ORAL | Status: AC
  Administered 2012-12-23: 20 mg via ORAL
  Filled 2012-12-23: qty 2

## 2012-12-23 MED ORDER — MORPHINE SULFATE 4 MG/ML IJ SOLN
4.0000 mg | Freq: Once | INTRAMUSCULAR | Status: AC
  Administered 2012-12-23: 4 mg via INTRAVENOUS
  Filled 2012-12-23: qty 1

## 2012-12-23 MED ORDER — DICYCLOMINE HCL 20 MG PO TABS: 20.0000 mg | ORAL_TABLET | Freq: Two times a day (BID) | ORAL | Status: DC

## 2012-12-23 MED ORDER — IOHEXOL 300 MG/ML  SOLN
100.0000 mL | Freq: Once | INTRAMUSCULAR | Status: AC | PRN
  Administered 2012-12-23: 100 mL via INTRAVENOUS

## 2012-12-23 NOTE — ED Provider Notes (Signed)
9:26 AM Patient awakened to discuss CT results. CT results are reassuring. The patient states that he continues to have mild pain, but he was seemingly comfortable while asleep.  We discussed the need to obtain a primary care physician, and given the patient's lack of insurance, he was provided resources to obtain one. Additionally, the patient requires additional ongoing gastroenterology care, and was advised to resume care with his physician, Dr. Olevia Perches.    [name], MD 12/23/12 0930

## 2012-12-23 NOTE — ED Notes (Addendum)
Patient transported to X-Ray 

## 2012-12-23 NOTE — ED Notes (Signed)
Pt in c/o abd pain with n/v x1 week, pt with history of crohn's and states this feels similar

## 2012-12-23 NOTE — ED Provider Notes (Signed)
History     CSN: [id number]  Arrival date & time 12/23/12  5361   First MD Initiated Contact with Patient 12/23/12 [phone]      Chief Complaint  Patient presents with  . Abdominal Pain    (Consider location/radiation/quality/duration/timing/severity/associated sxs/prior treatment) Patient is a 32 y.o. male presenting with abdominal pain. The history is provided by the patient.  Abdominal Pain This is a recurrent problem. Associated symptoms include abdominal pain. Pertinent negatives include no chest pain, no headaches and no shortness of breath.   patient presents with abdominal pain. He states it is in the upper abdomen. It began around a week ago. He states he's had nausea and vomiting. He feels like his Crohn's disease may be flaring up. No fevers. No diarrhea. Pain is dull and constant. No blood in emesis  Past Medical History  Diagnosis Date  . Crohn disease Dx 2004    terminal ileum Crohn's s/p ileocolectomy with anastamosis  . Bipolar 1 disorder     Self laceration and self harm behaviors   . Substance abuse   . Depression   . Anxiety   . Asthma     mild   . Hypertension     Past Surgical History  Procedure Laterality Date  . Colon surgery  01/17/2006  . Inguinal hernia repair      On the right and the left   . Tonsillectomy      20 yrs ago  . Esophagogastroduodenoscopy  09/04/2011    Procedure: ESOPHAGOGASTRODUODENOSCOPY (EGD);  Surgeon: Estanislado Emms., MD,FACG;  Location: [facility];  Service: Endoscopy;  Laterality: N/A;    Family History  Problem Relation Age of Onset  . Hypertension Other   . Diabetes Other     History  Substance Use Topics  . Smoking status: Current Every Day Smoker -- 0.50 packs/day    Types: Cigarettes  . Smokeless tobacco: Never Used  . Alcohol Use: Yes     Comment: occsionally      Review of Systems  Constitutional: Negative for activity change and appetite change.  HENT: Negative for neck stiffness.   Eyes: Negative  for pain.  Respiratory: Negative for chest tightness and shortness of breath.   Cardiovascular: Negative for chest pain and leg swelling.  Gastrointestinal: Positive for nausea, vomiting and abdominal pain. Negative for diarrhea.  Genitourinary: Negative for flank pain.  Musculoskeletal: Negative for back pain.  Skin: Negative for rash.  Neurological: Negative for weakness, numbness and headaches.  Psychiatric/Behavioral: Negative for behavioral problems.    Allergies  Penicillins and Diphenhydramine hcl  Home Medications   Current Outpatient Rx  Name  Route  Sig  Dispense  Refill  . HYDROcodone-acetaminophen (NORCO/VICODIN) 5-325 MG per tablet   Oral   Take 1 tablet by mouth every 6 (six) hours as needed for pain.           BP 119/81  Pulse 78  Temp(Src) 98.1 F (36.7 C) (Oral)  Resp 20  SpO2 95%  Physical Exam  Nursing note and vitals reviewed. Constitutional: He is oriented to person, place, and time. He appears well-developed and well-nourished.  Patient appears uncomfortable  HENT:  Head: Normocephalic and atraumatic.  Poor dentition  Eyes: EOM are normal. Pupils are equal, round, and reactive to light.  Neck: Normal range of motion. Neck supple.  Cardiovascular: Normal rate, regular rhythm and normal heart sounds.   No murmur heard. Pulmonary/Chest: Effort normal and breath sounds normal.  Abdominal: Soft. Bowel sounds are  normal. He exhibits no distension and no mass. There is tenderness. There is no rebound and no guarding.  Right upper abdominal tenderness. Mild diffuse abdominal tenderness. Midline lower abdominal scar from previous surgery  Musculoskeletal: Normal range of motion. He exhibits no edema.  Neurological: He is alert and oriented to person, place, and time. No cranial nerve deficit.  Skin: Skin is warm and dry.  Psychiatric: He has a normal mood and affect.    ED Course  Procedures (including critical care time)  Labs Reviewed  CBC WITH  DIFFERENTIAL - Abnormal; Notable for the following:    Eosinophils Relative 6 (*)    All other components within normal limits  COMPREHENSIVE METABOLIC PANEL - Abnormal; Notable for the following:    Total Bilirubin 0.2 (*)    All other components within normal limits  LIPASE, BLOOD   Dg Abd Acute W/chest  12/23/2012   *RADIOLOGY REPORT*  Clinical Data: Cough; history of asthma.  Mild lower abdominal pain and nausea.  History of Crohn's disease.  ACUTE ABDOMEN SERIES (ABDOMEN 2 VIEW & CHEST 1 VIEW)  Comparison: Chest radiograph performed 07/22/2011, and chest and abdominal radiographs performed 02/09/2011  Findings: The lungs are well-aerated and clear.  There is no evidence of focal opacification, pleural effusion or pneumothorax. The cardiomediastinal silhouette is within normal limits.  The visualized bowel gas pattern is unremarkable.  Scattered stool and air are seen within the colon; there is no evidence of small bowel dilatation to suggest obstruction.  No free intra-abdominal air is identified on the provided upright view.  No acute osseous abnormalities are seen; the sacroiliac joints are unremarkable in appearance.  IMPRESSION:  1.  Unremarkable bowel gas pattern; no free intra-abdominal air seen. 2.  No acute cardiopulmonary process identified.   Original Report Authenticated By: Santa Lighter, M.D.     No diagnosis found.    MDM  Patient nausea vomiting and abdominal pain. History of Crohn's disease. He has had some continued tenderness after treatment. Lab work is reassuring. CT is pending at this time.        Jasper Riling. Alvino Chapel, MD 12/23/12 [phone]

## 2012-12-23 NOTE — Progress Notes (Signed)
[facility] has seen patient and provided him with a list of primary care resources.

## 2013-06-25 ENCOUNTER — Emergency Department (HOSPITAL_COMMUNITY)
Admission: EM | Admit: 2013-06-25 | Discharge: 2013-06-25 | Disposition: A | Discharge status: Home / Self Care | Payer: Self-pay | Attending: Emergency Medicine | Admitting: Emergency Medicine

## 2013-06-25 ENCOUNTER — Encounter (HOSPITAL_COMMUNITY): Payer: Self-pay | Admitting: Emergency Medicine

## 2013-06-25 DIAGNOSIS — K5090 Crohn's disease, unspecified, without complications: Secondary | ICD-10-CM | POA: Insufficient documentation

## 2013-06-25 DIAGNOSIS — Z880 Allergy status to penicillin: Secondary | ICD-10-CM | POA: Insufficient documentation

## 2013-06-25 DIAGNOSIS — L03211 Cellulitis of face: Secondary | ICD-10-CM | POA: Insufficient documentation

## 2013-06-25 DIAGNOSIS — I10 Essential (primary) hypertension: Secondary | ICD-10-CM | POA: Insufficient documentation

## 2013-06-25 DIAGNOSIS — F17200 Nicotine dependence, unspecified, uncomplicated: Secondary | ICD-10-CM | POA: Insufficient documentation

## 2013-06-25 DIAGNOSIS — L0201 Cutaneous abscess of face: Secondary | ICD-10-CM | POA: Insufficient documentation

## 2013-06-25 DIAGNOSIS — J45909 Unspecified asthma, uncomplicated: Secondary | ICD-10-CM | POA: Insufficient documentation

## 2013-06-25 MED ORDER — CLINDAMYCIN HCL 150 MG PO CAPS: 150.0000 mg | ORAL_CAPSULE | Freq: Four times a day (QID) | ORAL | Status: DC

## 2013-06-25 MED ORDER — HYDROCODONE-ACETAMINOPHEN 5-325 MG PO TABS: 1.0000 | ORAL_TABLET | ORAL | Status: DC | PRN

## 2013-06-25 MED ORDER — TETANUS-DIPHTH-ACELL PERTUSSIS 5-2.5-18.5 LF-MCG/0.5 IM SUSP
0.5000 mL | Freq: Once | INTRAMUSCULAR | Status: AC
  Administered 2013-06-25: 0.5 mL via INTRAMUSCULAR
  Filled 2013-06-25: qty 0.5

## 2013-06-25 MED ORDER — HYDROCODONE-ACETAMINOPHEN 5-325 MG PO TABS
1.0000 | ORAL_TABLET | Freq: Once | ORAL | Status: AC
  Administered 2013-06-25: 1 via ORAL
  Filled 2013-06-25: qty 1

## 2013-06-25 NOTE — ED Provider Notes (Signed)
CSN: [id number]     Arrival date & time 06/25/13  1125 History   First MD Initiated Contact with Patient 06/25/13 1133     No chief complaint on file.  (Consider location/radiation/quality/duration/timing/severity/associated sxs/prior Treatment) HPI  32 year old male presents for evaluation of a facial abscess. Patient reports 2 weeks ago he noticed a small pimple on the bridge of his nose. He popped it and ever since he has been, and increase in size. Complaining of soreness to the affected area and 2 days ago he also notice some mild pus or drainage. Rash this radiates towards his right eyelid and causing it to be puffy. He tries taking home medication including Tylenol and ibuprofen with minimal relief. Pain sometimes with severe that it caused him to vomit. Reports having subjective fever. Denies headache, vision changes, runny nose, sore throat. Denies any spider bite. No prior history of abscess. Does not recall last tetanus shot.  Past Medical History  Diagnosis Date  . Crohn disease Dx 2004    terminal ileum Crohn's s/p ileocolectomy with anastamosis  . Bipolar 1 disorder     Self laceration and self harm behaviors   . Substance abuse   . Depression   . Anxiety   . Asthma     mild   . Hypertension    Past Surgical History  Procedure Laterality Date  . Colon surgery  01/17/2006  . Inguinal hernia repair      On the right and the left   . Tonsillectomy      20 yrs ago  . Esophagogastroduodenoscopy  09/04/2011    Procedure: ESOPHAGOGASTRODUODENOSCOPY (EGD);  Surgeon: Estanislado Emms., MD,FACG;  Location: [facility];  Service: Endoscopy;  Laterality: N/A;   Family History  Problem Relation Age of Onset  . Hypertension Other   . Diabetes Other    History  Substance Use Topics  . Smoking status: Current Every Day Smoker -- 0.50 packs/day    Types: Cigarettes  . Smokeless tobacco: Never Used  . Alcohol Use: Yes     Comment: occsionally    Review of Systems    Constitutional: Negative for fever.  Skin: Positive for rash.    Allergies  Penicillins and Diphenhydramine hcl  Home Medications   Current Outpatient Rx  Name  Route  Sig  Dispense  Refill  . OVER THE COUNTER MEDICATION   Oral   Take 2 tablets by mouth daily as needed (congestion).          There were no vitals taken for this visit. Physical Exam  Nursing note and vitals reviewed. Constitutional: He appears well-developed and well-nourished. No distress.  HENT:  Head: Normocephalic and atraumatic.    Eyes: Conjunctivae and EOM are normal. Pupils are equal, round, and reactive to light.  Neck: Neck supple.  Musculoskeletal: Normal range of motion.  Lymphadenopathy:    He has no cervical adenopathy.  Neurological: He is alert.  Skin: Skin is warm.  Psychiatric: He has a normal mood and affect.    ED Course  Procedures (including critical care time)  12:02 PM Cutaneous facial abscess, amenable to drainage.  INCISION AND DRAINAGE Performed by: Domenic Moras Consent: Verbal consent obtained. Risks and benefits: risks, benefits and alternatives were discussed Type: abscess  Body area: bridge of nose  Anesthesia: local infiltration  Incision was made with a scalpel.  Local anesthetic: lidocaine 2% w/o epinephrine  Anesthetic total: 2 ml  Complexity: simple Blunt dissection to break up loculations  Drainage: purulent  Drainage amount: small  Packing material: 1/4 in iodoform gauze  Patient tolerance: Patient tolerated the procedure well with no immediate complications.     Labs Review Labs Reviewed - No data to display Imaging Review No results found.  EKG Interpretation   None       MDM   1. Cutaneous abscess of face    BP 116/82  Pulse 81  Temp(Src) 98.1 F (36.7 C) (Oral)  Resp 16  SpO2 96%     Domenic Moras, PA-C 06/25/13 1203

## 2013-06-25 NOTE — ED Notes (Signed)
Pt arrived to ED with an abscess on the bridge of his nose.  Pt has had the sore for two weeks.  Since the abscess has been draining he has been having emesis.

## 2013-06-25 NOTE — ED Provider Notes (Signed)
Medical screening examination/treatment/procedure(s) were performed by non-physician practitioner and as supervising physician I was immediately available for consultation/collaboration.  EKG Interpretation   None         Neta Ehlers, MD 06/25/13 [phone]

## 2013-07-07 ENCOUNTER — Emergency Department (HOSPITAL_COMMUNITY)
Admission: EM | Admit: 2013-07-07 | Discharge: 2013-07-07 | Disposition: A | Discharge status: Home / Self Care | Payer: Self-pay | Attending: Emergency Medicine | Admitting: Emergency Medicine

## 2013-07-07 ENCOUNTER — Encounter (HOSPITAL_COMMUNITY): Payer: Self-pay | Admitting: Emergency Medicine

## 2013-07-07 DIAGNOSIS — Z8719 Personal history of other diseases of the digestive system: Secondary | ICD-10-CM | POA: Insufficient documentation

## 2013-07-07 DIAGNOSIS — Z792 Long term (current) use of antibiotics: Secondary | ICD-10-CM | POA: Insufficient documentation

## 2013-07-07 DIAGNOSIS — T368X5A Adverse effect of other systemic antibiotics, initial encounter: Secondary | ICD-10-CM | POA: Insufficient documentation

## 2013-07-07 DIAGNOSIS — B370 Candidal stomatitis: Secondary | ICD-10-CM | POA: Insufficient documentation

## 2013-07-07 DIAGNOSIS — I10 Essential (primary) hypertension: Secondary | ICD-10-CM | POA: Insufficient documentation

## 2013-07-07 DIAGNOSIS — J45909 Unspecified asthma, uncomplicated: Secondary | ICD-10-CM | POA: Insufficient documentation

## 2013-07-07 DIAGNOSIS — F17200 Nicotine dependence, unspecified, uncomplicated: Secondary | ICD-10-CM | POA: Insufficient documentation

## 2013-07-07 DIAGNOSIS — Z880 Allergy status to penicillin: Secondary | ICD-10-CM | POA: Insufficient documentation

## 2013-07-07 DIAGNOSIS — Z8659 Personal history of other mental and behavioral disorders: Secondary | ICD-10-CM | POA: Insufficient documentation

## 2013-07-07 DIAGNOSIS — H109 Unspecified conjunctivitis: Secondary | ICD-10-CM | POA: Insufficient documentation

## 2013-07-07 MED ORDER — TETRACAINE HCL 0.5 % OP SOLN
1.0000 [drp] | Freq: Once | OPHTHALMIC | Status: AC
  Administered 2013-07-07: 1 [drp] via OPHTHALMIC
  Filled 2013-07-07: qty 2

## 2013-07-07 MED ORDER — BACITRACIN-POLYMYXIN B 500-10000 UNIT/GM OP OINT: 1.0000 "application " | TOPICAL_OINTMENT | Freq: Two times a day (BID) | OPHTHALMIC | Status: DC

## 2013-07-07 MED ORDER — FLUORESCEIN SODIUM 1 MG OP STRP
1.0000 | ORAL_STRIP | Freq: Once | OPHTHALMIC | Status: AC
  Administered 2013-07-07: 17:00:00 via OPHTHALMIC
  Filled 2013-07-07: qty 1

## 2013-07-07 MED ORDER — NYSTATIN 100000 UNIT/ML MT SUSP: 500000.0000 [IU] | Freq: Four times a day (QID) | OROMUCOSAL | Status: DC

## 2013-07-07 NOTE — ED Provider Notes (Signed)
CSN: [id number]     Arrival date & time 07/07/13  1458 History  This chart was scribed for non-physician practitioner Antonietta Breach, PA-C working with Osvaldo Shipper, MD by Drue Stager, ED Scribe. This patient was seen in room WTR8/WTR8 and the patient's care was started at 4:28 PM.   Chief Complaint  Patient presents with  . Allergic Reaction    The history is provided by the patient. No language interpreter was used.   HPI Comments: [name] is a 32 y.o. male who presents to the Emergency Department complaining of L eye redness and itching as well as a sensation of lesions in his mouth. Patient attributes symptoms to his recent use of clindamycin, prescribed to him for an abscess on the R side of his nose. Patient began clindamycin last week and finished course 2 days ago. He states that symptoms have persisted despite stopping this antibiotic. Pt has not taken anything for symptoms. Pt denies fever, rashes, oral bleeding, difficulty swallowing, drooling, swelling of his tongue/lips, purulent drainage from the eye, vision changes, and shortness of breath. Pt is a smoker.    Past Medical History  Diagnosis Date  . Crohn disease Dx 2004    terminal ileum Crohn's s/p ileocolectomy with anastamosis  . Bipolar 1 disorder     Self laceration and self harm behaviors   . Substance abuse   . Depression   . Anxiety   . Asthma     mild   . Hypertension    Past Surgical History  Procedure Laterality Date  . Colon surgery  01/17/2006  . Inguinal hernia repair      On the right and the left   . Tonsillectomy      20 yrs ago  . Esophagogastroduodenoscopy  09/04/2011    Procedure: ESOPHAGOGASTRODUODENOSCOPY (EGD);  Surgeon: Estanislado Emms., MD,FACG;  Location: [facility];  Service: Endoscopy;  Laterality: N/A;   Family History  Problem Relation Age of Onset  . Hypertension Other   . Diabetes Other    History  Substance Use Topics  . Smoking status: Current Every Day  Smoker -- 0.50 packs/day    Types: Cigarettes  . Smokeless tobacco: Never Used  . Alcohol Use: Yes     Comment: occsionally    Review of Systems  Constitutional: Negative for fever and chills.  HENT: Negative for drooling and trouble swallowing.   Eyes: Positive for redness and itching. Negative for visual disturbance.  Skin: Negative for rash.  All other systems reviewed and are negative.    Allergies  Penicillins and Diphenhydramine hcl  Home Medications   Current Outpatient Rx  Name  Route  Sig  Dispense  Refill  . clindamycin (CLEOCIN) 150 MG capsule   Oral   Take 1 capsule (150 mg total) by mouth every 6 (six) hours.   28 capsule   0   . HYDROcodone-acetaminophen (NORCO/VICODIN) 5-325 MG per tablet   Oral   Take 1 tablet by mouth every 4 (four) hours as needed.   8 tablet   0   . OVER THE COUNTER MEDICATION   Oral   Take 2 tablets by mouth daily as needed (congestion).         . bacitracin-polymyxin b (POLYSPORIN) ophthalmic ointment   Left Eye   Place 1 application into the left eye every 12 (twelve) hours. apply to eye every 12 hours while awake   3.5 g   0   . nystatin (MYCOSTATIN) 100000  UNIT/ML suspension   Oral   Take 5 mLs (500,000 Units total) by mouth 4 (four) times daily.   60 mL   0    Triage Vitals:BP 119/83  Pulse 99  Temp(Src) 98.1 F (36.7 C)  Resp 20  SpO2 98% Physical Exam  Nursing note and vitals reviewed. Constitutional: He is oriented to person, place, and time. He appears well-developed and well-nourished. No distress.  HENT:  Head: Normocephalic and atraumatic.  Right Ear: External ear normal.  Left Ear: External ear normal.  Nose: Nose normal.  Mouth/Throat: Uvula is midline, oropharynx is clear and moist and mucous membranes are normal. No oral lesions. No uvula swelling. No oropharyngeal exudate, posterior oropharyngeal edema or posterior oropharyngeal erythema.  Airway patent and patient tolerating secretions without  difficulty. No oral lesions or bleeding. Tongue with white plaque able to be scraped off with wooden end of cotton swab. No angioedema appreciated.  Eyes: EOM are normal. Pupils are equal, round, and reactive to light. Left eye exhibits no discharge and no hordeolum. No foreign body present in the left eye. Right conjunctiva is not injected. Right conjunctiva has no hemorrhage. Left conjunctiva is injected. Left conjunctiva has no hemorrhage. No scleral icterus.  Mild L upper lid blepharitis of L eye. No tearing or purulent drainage. No fluorescein uptake in L eye; no corneal abrasion or ulcer. IOP L eye 14 with 95% CI.   Neck: Normal range of motion. Neck supple.  Cardiovascular: Normal rate, regular rhythm and normal heart sounds.   Pulmonary/Chest: Effort normal and breath sounds normal. No stridor. No respiratory distress. He has no wheezes. He has no rales.  Musculoskeletal: Normal range of motion.  Lymphadenopathy:    He has no cervical adenopathy.  Neurological: He is alert and oriented to person, place, and time.  Skin: Skin is warm and dry. No rash noted. He is not diaphoretic. No erythema. No pallor.  No rashes appreciated.  Psychiatric: He has a normal mood and affect. His behavior is normal.    ED Course  Procedures (including critical care time) DIAGNOSTIC STUDIES: Oxygen Saturation is 98% on RA, normal by my interpretation.    COORDINATION OF CARE: 4:35 PM- Discussed treatment plan with pt at bedside. Pt verbalized understanding and agreement with plan.   Labs Review Labs Reviewed - No data to display Imaging Review No results found.  EKG Interpretation   None       MDM   1. Conjunctivitis   2. Thrush, oral    Patient presents for L eye redness and itching as well as sensation of lesions in his mouth. He reports taking clindamycin for facial abscess prior to symptom onset; symptoms have persisted despite stopping medication 48 hours ago. Patient well and nontoxic  appearing, hemodynamically stable, and afebrile. Physical exam findings c/w thrush and L eye conjunctivitis. No corneal abrasion or ulcer. Airway patent without angioedema; no stridor and lungs CTAB. No rashes or signs concerning for SJS, erythema multiforme major, or erythema multiforme minor. Patient stable and appropriate for d/c with polysporin and nystatin rinse for thrush. Return precautions discussed and patient agreeable to plan with no unaddressed concerns.  I personally performed the services described in this documentation, which was scribed in my presence. The recorded information has been reviewed and is accurate.     Antonietta Breach, PA-C 07/08/13 1423

## 2013-07-07 NOTE — ED Notes (Signed)
Pt states feels like having allergic reaction to clindamycin he was started on last wk; states two days ago developed itching/eyes swelling/scratchy throat; stopped clindamycin but symptoms persist

## 2013-07-08 NOTE — ED Provider Notes (Signed)
Medical screening examination/treatment/procedure(s) were performed by non-physician practitioner and as supervising physician I was immediately available for consultation/collaboration.  EKG Interpretation   None         Osvaldo Shipper, MD 07/08/13 [phone]

## 2014-05-05 ENCOUNTER — Encounter (HOSPITAL_COMMUNITY): Payer: Self-pay | Admitting: Emergency Medicine

## 2014-05-05 ENCOUNTER — Emergency Department (HOSPITAL_COMMUNITY)
Admission: EM | Admit: 2014-05-05 | Discharge: 2014-05-05 | Disposition: A | Discharge status: Home / Self Care | Payer: Self-pay | Attending: Emergency Medicine | Admitting: Emergency Medicine

## 2014-05-05 ENCOUNTER — Emergency Department (HOSPITAL_COMMUNITY): Payer: Self-pay

## 2014-05-05 DIAGNOSIS — Z8719 Personal history of other diseases of the digestive system: Secondary | ICD-10-CM | POA: Insufficient documentation

## 2014-05-05 DIAGNOSIS — Z720 Tobacco use: Secondary | ICD-10-CM | POA: Insufficient documentation

## 2014-05-05 DIAGNOSIS — R0789 Other chest pain: Secondary | ICD-10-CM | POA: Insufficient documentation

## 2014-05-05 DIAGNOSIS — Z880 Allergy status to penicillin: Secondary | ICD-10-CM | POA: Insufficient documentation

## 2014-05-05 DIAGNOSIS — I10 Essential (primary) hypertension: Secondary | ICD-10-CM | POA: Insufficient documentation

## 2014-05-05 DIAGNOSIS — J189 Pneumonia, unspecified organism: Secondary | ICD-10-CM

## 2014-05-05 DIAGNOSIS — M791 Myalgia: Secondary | ICD-10-CM | POA: Insufficient documentation

## 2014-05-05 DIAGNOSIS — Z8659 Personal history of other mental and behavioral disorders: Secondary | ICD-10-CM | POA: Insufficient documentation

## 2014-05-05 DIAGNOSIS — J45901 Unspecified asthma with (acute) exacerbation: Secondary | ICD-10-CM | POA: Insufficient documentation

## 2014-05-05 MED ORDER — ALBUTEROL SULFATE (2.5 MG/3ML) 0.083% IN NEBU
2.5000 mg | INHALATION_SOLUTION | Freq: Once | RESPIRATORY_TRACT | Status: AC
Start: 2014-05-05 — End: 2014-05-05
  Administered 2014-05-05: 2.5 mg via RESPIRATORY_TRACT
  Filled 2014-05-05: qty 3

## 2014-05-05 MED ORDER — PREDNISONE 10 MG PO TABS: ORAL_TABLET | ORAL | Status: DC

## 2014-05-05 MED ORDER — HYDROCODONE-ACETAMINOPHEN 5-325 MG PO TABS: ORAL_TABLET | ORAL | Status: DC

## 2014-05-05 MED ORDER — HYDROCOD POLST-CHLORPHEN POLST 10-8 MG/5ML PO LQCR
5.0000 mL | Freq: Once | ORAL | Status: AC
  Administered 2014-05-05: 5 mL via ORAL
  Filled 2014-05-05: qty 5

## 2014-05-05 MED ORDER — AZITHROMYCIN 250 MG PO TABS: 250.0000 mg | ORAL_TABLET | Freq: Every day | ORAL | Status: DC

## 2014-05-05 MED ORDER — IPRATROPIUM-ALBUTEROL 0.5-2.5 (3) MG/3ML IN SOLN
3.0000 mL | Freq: Once | RESPIRATORY_TRACT | Status: AC
Start: 2014-05-05 — End: 2014-05-05
  Administered 2014-05-05: 3 mL via RESPIRATORY_TRACT
  Filled 2014-05-05: qty 3

## 2014-05-05 MED ORDER — AZITHROMYCIN 250 MG PO TABS
500.0000 mg | ORAL_TABLET | Freq: Once | ORAL | Status: AC
Start: 2014-05-05 — End: 2014-05-05
  Administered 2014-05-05: 500 mg via ORAL
  Filled 2014-05-05: qty 2

## 2014-05-05 MED ORDER — ALBUTEROL SULFATE HFA 108 (90 BASE) MCG/ACT IN AERS
2.0000 | INHALATION_SPRAY | Freq: Once | RESPIRATORY_TRACT | Status: AC
  Administered 2014-05-05: 2 via RESPIRATORY_TRACT
  Filled 2014-05-05: qty 6.7

## 2014-05-05 NOTE — ED Notes (Signed)
Pt c/o symptoms started with headache x 1 week. Congestion started and would get choked on phlegm and vomited. Pt c/o rib pain. Mild left sided wheezing noted. Congestion and rales noted. NAD.

## 2014-05-05 NOTE — ED Provider Notes (Signed)
CSN: [id number]     Arrival date & time 05/05/14  1543 History   First MD Initiated Contact with Patient 05/05/14 1715     Chief Complaint  Patient presents with  . Headache  . Nasal Congestion     (Consider location/radiation/quality/duration/timing/severity/associated sxs/prior Treatment) HPI   Clifford Inglett Hensley Treat. is a 33 y.o. male who presents to the Emergency Department complaining of persistent cough, headache, body aches, chest congestion and chest tightness for one week.  He states the symptoms began with a gradual onset of headaches and body aches.  He reports the cough as being productive of yellow sputum and states that he has had pneumonia several times and his symptoms feels similar to previous episodes.  He also notes wheezing at times and chills.  He denies bloody sputum, vomiting, abdominal pain or shortness of breath.  He has tried OTC medications w/o relief.    Past Medical History  Diagnosis Date  . Crohn disease Dx 2004    terminal ileum Crohn's s/p ileocolectomy with anastamosis  . Bipolar 1 disorder     Self laceration and self harm behaviors   . Substance abuse   . Depression   . Anxiety   . Asthma     mild   . Hypertension    Past Surgical History  Procedure Laterality Date  . Colon surgery  01/17/2006  . Inguinal hernia repair      On the right and the left   . Tonsillectomy      20 yrs ago  . Esophagogastroduodenoscopy  09/04/2011    Procedure: ESOPHAGOGASTRODUODENOSCOPY (EGD);  Surgeon: Estanislado Emms., MD,FACG;  Location: [facility];  Service: Endoscopy;  Laterality: N/A;   Family History  Problem Relation Age of Onset  . Hypertension Other   . Diabetes Other    History  Substance Use Topics  . Smoking status: Current Every Day Smoker -- 0.50 packs/day    Types: Cigarettes  . Smokeless tobacco: Never Used  . Alcohol Use: Yes     Comment: occsionally    Review of Systems  Constitutional: Positive for fever and chills. Negative  for activity change and appetite change.  HENT: Positive for congestion. Negative for facial swelling, rhinorrhea, sore throat and trouble swallowing.   Eyes: Negative for visual disturbance.  Respiratory: Positive for cough, chest tightness and wheezing. Negative for shortness of breath and stridor.   Gastrointestinal: Negative for nausea, vomiting and abdominal pain.  Genitourinary: Negative for dysuria.  Musculoskeletal: Positive for myalgias. Negative for neck pain and neck stiffness.  Skin: Negative.  Negative for rash.  Neurological: Negative for dizziness, weakness, numbness and headaches.  Hematological: Negative for adenopathy.  Psychiatric/Behavioral: Negative for confusion.  All other systems reviewed and are negative.     Allergies  Penicillins and Diphenhydramine hcl  Home Medications   Prior to Admission medications   Not on File   BP 127/87  Pulse 82  Temp(Src) 98.6 F (37 C) (Oral)  Resp 18  Ht 5' 5"  (1.651 m)  Wt 110 lb (49.896 kg)  BMI 18.31 kg/m2  SpO2 95% Physical Exam  Nursing note and vitals reviewed. Constitutional: He is oriented to person, place, and time. He appears well-developed and well-nourished. No distress.  HENT:  Head: Normocephalic and atraumatic.  Right Ear: Tympanic membrane and ear canal normal.  Left Ear: Tympanic membrane and ear canal normal.  Mouth/Throat: Uvula is midline and mucous membranes are normal. No uvula swelling. Posterior oropharyngeal erythema present. No oropharyngeal  exudate, posterior oropharyngeal edema or tonsillar abscesses.  Eyes: EOM are normal. Pupils are equal, round, and reactive to light.  Neck: Normal range of motion, full passive range of motion without pain and phonation normal. Neck supple.  Cardiovascular: Normal rate, regular rhythm, normal heart sounds and intact distal pulses.   No murmur heard. Pulmonary/Chest: Effort normal. No stridor. No respiratory distress. He has wheezes. He has rales. He  exhibits no tenderness.  Coarse lungs sounds bilaterally with inspiratory wheezes and rales noted at right base  Abdominal: Soft. He exhibits no distension. There is no tenderness. There is no rebound.  Musculoskeletal: Normal range of motion. He exhibits no edema.  Lymphadenopathy:    He has no cervical adenopathy.  Neurological: He is alert and oriented to person, place, and time. He exhibits normal muscle tone. Coordination normal.  Skin: Skin is warm and dry.    ED Course  Procedures (including critical care time) Labs Review Labs Reviewed - No data to display  Imaging Review Dg Chest 2 View  05/05/2014   CLINICAL DATA:  Productive cough.  EXAM: CHEST  2 VIEW  COMPARISON:  12/23/2012.  FINDINGS: Mediastinum hilar structures are normal. Mild right perihilar right lower lobe infiltrate cannot be excluded. No pleural effusion no pneumothorax. Heart size normal. No acute bony abnormality appear  IMPRESSION: Mild right perihilar right lower lobe infiltrate suggesting pneumonia .   Electronically Signed   By: Marcello Moores  Register   On: 05/05/2014 16:31     EKG Interpretation None      MDM   Final diagnoses:  Community acquired pneumonia    Patient is well appearing, non-toxic.  VSS.  He has drank fluids and ate crackers.  No hypoxia.  XR results discussed.  Patient appears stable for outpatient therapy with zithromax, albuterol inhaler, prednisone taper and he agrees to return here in 2-3 days if not improving.        Clifford Bonano L. Rusty Glodowski, PA-C 05/07/14 1357

## 2014-05-05 NOTE — Discharge Instructions (Signed)
Pneumonia, Adult  Pneumonia is an infection of the lungs. It may be caused by a germ (virus or bacteria). Some types of pneumonia can spread easily from person to person. This can happen when you cough or sneeze.  HOME CARE   Only take medicine as told by your doctor.   Take your medicine (antibiotics) as told. Finish it even if you start to feel better.   Do not smoke.   You may use a vaporizer or humidifier in your room. This can help loosen thick spit (mucus).   Sleep so you are almost sitting up (semi-upright). This helps reduce coughing.   Rest.  A shot (vaccine) can help prevent pneumonia. Shots are often advised for:   People over 65 years old.   Patients on chemotherapy.   People with long-term (chronic) lung problems.   People with immune system problems.  GET HELP RIGHT AWAY IF:    You are getting worse.   You cannot control your cough, and you are losing sleep.   You cough up blood.   Your pain gets worse, even with medicine.   You have a fever.   Any of your problems are getting worse, not better.   You have shortness of breath or chest pain.  MAKE SURE YOU:    Understand these instructions.   Will watch your condition.   Will get help right away if you are not doing well or get worse.  Document Released: 12/27/2007 Document Revised: 10/02/2011 Document Reviewed: 09/30/2010  [employer] Patient Information 2015 [employer]. This information is not intended to replace advice given to you by your health care provider. Make sure you discuss any questions you have with your health care provider.

## 2014-05-08 NOTE — ED Provider Notes (Signed)
Medical screening examination/treatment/procedure(s) were performed by non-physician practitioner and as supervising physician I was immediately available for consultation/collaboration.   EKG Interpretation None       Nat Christen, MD 05/08/14 1408

## 2014-07-15 ENCOUNTER — Encounter (HOSPITAL_COMMUNITY): Payer: Self-pay | Admitting: Emergency Medicine

## 2014-07-15 ENCOUNTER — Emergency Department (HOSPITAL_COMMUNITY)
Admission: EM | Admit: 2014-07-15 | Discharge: 2014-07-15 | Disposition: A | Discharge status: Home / Self Care | Payer: Self-pay | Attending: Emergency Medicine | Admitting: Emergency Medicine

## 2014-07-15 DIAGNOSIS — K047 Periapical abscess without sinus: Secondary | ICD-10-CM | POA: Insufficient documentation

## 2014-07-15 DIAGNOSIS — Z8719 Personal history of other diseases of the digestive system: Secondary | ICD-10-CM | POA: Insufficient documentation

## 2014-07-15 DIAGNOSIS — J45909 Unspecified asthma, uncomplicated: Secondary | ICD-10-CM | POA: Insufficient documentation

## 2014-07-15 DIAGNOSIS — I10 Essential (primary) hypertension: Secondary | ICD-10-CM | POA: Insufficient documentation

## 2014-07-15 DIAGNOSIS — Z880 Allergy status to penicillin: Secondary | ICD-10-CM | POA: Insufficient documentation

## 2014-07-15 DIAGNOSIS — Z720 Tobacco use: Secondary | ICD-10-CM | POA: Insufficient documentation

## 2014-07-15 DIAGNOSIS — Z8659 Personal history of other mental and behavioral disorders: Secondary | ICD-10-CM | POA: Insufficient documentation

## 2014-07-15 MED ORDER — LIDOCAINE-EPINEPHRINE (PF) 1 %-1:200000 IJ SOLN
INTRAMUSCULAR | Status: AC
  Filled 2014-07-15: qty 10

## 2014-07-15 MED ORDER — OXYCODONE-ACETAMINOPHEN 5-325 MG PO TABS
1.0000 | ORAL_TABLET | Freq: Once | ORAL | Status: AC
  Administered 2014-07-15: 1 via ORAL
  Filled 2014-07-15: qty 1

## 2014-07-15 MED ORDER — OXYCODONE-ACETAMINOPHEN 5-325 MG PO TABS
1.0000 | ORAL_TABLET | Freq: Four times a day (QID) | ORAL | Status: DC | PRN
Start: 2014-07-15 — End: 2014-10-05

## 2014-07-15 MED ORDER — POVIDONE-IODINE 10 % EX SOLN
CUTANEOUS | Status: AC
  Filled 2014-07-15: qty 118

## 2014-07-15 MED ORDER — CLINDAMYCIN HCL 150 MG PO CAPS: 300.0000 mg | ORAL_CAPSULE | Freq: Three times a day (TID) | ORAL | Status: DC

## 2014-07-15 MED ORDER — CLINDAMYCIN HCL 150 MG PO CAPS
300.0000 mg | ORAL_CAPSULE | Freq: Once | ORAL | Status: AC
  Administered 2014-07-15: 300 mg via ORAL
  Filled 2014-07-15: qty 2

## 2014-07-15 NOTE — ED Notes (Signed)
Pt c/o right upper tooth abscess. Pain and swelling since yesterday.

## 2014-07-15 NOTE — ED Provider Notes (Signed)
CSN: [id number]     Arrival date & time 07/15/14  1324 History   None    This chart was scribed for Orlie Dakin, MD by Forrestine Him, ED Scribe. This patient was seen in room APA01/APA01 and the patient's care was started 7:36 AM.   No chief complaint on file.  The history is provided by the patient. No language interpreter was used.    HPI Comments: [name] is a 33 y.o. male with a PMHx of crohn's disease, Bipolar 1 disorder, HTN who presents to the Emergency Department her for a dental problem onset 1 day that has progressively worsened overnight. Pt has tried OTC BC Powders without any improvement for symptoms. Complains of swelling on the right side of his face since last night. No fever. No other associated symptoms. Nothing makes pain better or worse. [name] is an every day smoker and smokes approximately half a pack daily. Pt also admits to occasional marijuana use and alcohol consumption. He is not currently followed by a dentist due to no insurance. Last follow up several years ago. Pt with known allergies to Penicillin and Benadryl.   Past Medical History  Diagnosis Date  . Crohn disease Dx 2004    terminal ileum Crohn's s/p ileocolectomy with anastamosis  . Bipolar 1 disorder     Self laceration and self harm behaviors   . Substance abuse   . Depression   . Anxiety   . Asthma     mild   . Hypertension    Past Surgical History  Procedure Laterality Date  . Colon surgery  01/17/2006  . Inguinal hernia repair      On the right and the left   . Tonsillectomy      20 yrs ago  . Esophagogastroduodenoscopy  09/04/2011    Procedure: ESOPHAGOGASTRODUODENOSCOPY (EGD);  Surgeon: Estanislado Emms., MD,FACG;  Location: [facility];  Service: Endoscopy;  Laterality: N/A;   Family History  Problem Relation Age of Onset  . Hypertension Other   . Diabetes Other    History  Substance Use Topics  . Smoking status: Current Every Day Smoker -- 0.50 packs/day     Types: Cigarettes  . Smokeless tobacco: Never Used  . Alcohol Use: Yes     Comment: occsionally    Review of Systems  Constitutional: Negative.   HENT: Positive for dental problem and facial swelling.   Respiratory: Negative.   Cardiovascular: Negative.   Gastrointestinal: Negative.   Musculoskeletal: Negative.   Skin: Negative.   Neurological: Negative.   Psychiatric/Behavioral: Negative.   All other systems reviewed and are negative.     Allergies  Penicillins and Diphenhydramine hcl  Home Medications   Prior to Admission medications   Medication Sig Start Date End Date Taking? Authorizing Provider  azithromycin (ZITHROMAX) 250 MG tablet Take 1 tablet (250 mg total) by mouth daily. Take first 2 tablets together, then 1 every day until finished. 05/05/14   Tammy L. Triplett, PA-C  HYDROcodone-acetaminophen (NORCO/VICODIN) 5-325 MG per tablet Take one-two tabs po q 4-6 hrs prn pain 05/05/14   Tammy L. Triplett, PA-C  predniSONE (DELTASONE) 10 MG tablet Take 6 tablets day one, 5 tablets day two, 4 tablets day three, 3 tablets day four, 2 tablets day five, then 1 tablet day six 05/05/14   Tammy L. Triplett, PA-C   Triage Vitals: BP 145/106 mmHg  Pulse 77  Temp(Src) 98.2 F (36.8 C) (Oral)  Resp 16  Ht 5' 5"  (1.651  m)  Wt 115 lb (52.164 kg)  BMI 19.14 kg/m2  SpO2 96%   Physical Exam  Constitutional: He appears well-developed and well-nourished.  HENT:  Head: Normocephalic and atraumatic.  Generally poor dentition, multiple missing teeth, multiple caries. There is an gingival abscess immediately lateral to teeth #16 and 17  Eyes: Conjunctivae are normal. Pupils are equal, round, and reactive to light.  Neck: Neck supple. No tracheal deviation present. No thyromegaly present.  Cardiovascular: Normal rate and regular rhythm.   No murmur heard. Pulmonary/Chest: Effort normal and breath sounds normal.  Abdominal: Soft. Bowel sounds are normal. He exhibits no distension.  There is no tenderness.  Musculoskeletal: Normal range of motion. He exhibits no edema or tenderness.  Lymphadenopathy:    He has no cervical adenopathy.  Neurological: He is alert. Coordination normal.  Skin: Skin is warm and dry. No rash noted.  Psychiatric: He has a normal mood and affect.  Nursing note and vitals reviewed.   ED Course  Procedures (including critical care time)  DIAGNOSTIC STUDIES: Oxygen Saturation is 96% on RA, adequate by my interpretation.    COORDINATION OF CARE: 7:35 AM- Will drain abscess. Discussed treatment plan with pt at bedside and pt agreed to plan.     Labs Review Labs Reviewed - No data to display  Imaging Review No results found.   EKG Interpretation None     Procedure timeout performed 7:55 PM gingiva right lateral adjacent to teeth numbers 16 and 17 number locally with 1% lidocaine with epinephrine. A stab incision was made with a #11 blade small amount of pus and blood came out of the wound.  8:40 PM patient resting comfortably. Good hemostasis MDM   Final diagnoses:  None   plan prescription Percocet, clindamycin. Salt water rinses Patient is encouraged to find a dentist  Diagnosis dental abscess  I personally performed the services described in this documentation, which was scribed in my presence. The recorded information has been reviewed and is accurate.    Orlie Dakin, MD 07/15/14 [phone]

## 2014-07-15 NOTE — Discharge Instructions (Signed)
Dental Abscess Dissolve 1/4 teaspoon salt in 8 ounces of warm water and rinse your mouth out 4 times daily. See a dentist as soon as possible. Tell office staff that you were seen here making the appointment. Take Tylenol for mild pain or the pain medicine prescribed for bad pain. Do not take Tylenol and the pain medicine prescribed together. A dental abscess is a collection of infected fluid (pus) from a bacterial infection in the inner part of the tooth (pulp). It usually occurs at the end of the tooth's root.  CAUSES   Severe tooth decay.  Trauma to the tooth that allows bacteria to enter into the pulp, such as a broken or chipped tooth. SYMPTOMS   Severe pain in and around the infected tooth.  Swelling and redness around the abscessed tooth or in the mouth or face.  Tenderness.  Pus drainage.  Bad breath.  Bitter taste in the mouth.  Difficulty swallowing.  Difficulty opening the mouth.  Nausea.  Vomiting.  Chills.  Swollen neck glands. DIAGNOSIS   A medical and dental history will be taken.  An examination will be performed by tapping on the abscessed tooth.  X-rays may be taken of the tooth to identify the abscess. TREATMENT The goal of treatment is to eliminate the infection. You may be prescribed antibiotic medicine to stop the infection from spreading. A root canal may be performed to save the tooth. If the tooth cannot be saved, it may be pulled (extracted) and the abscess may be drained.  HOME CARE INSTRUCTIONS  Only take over-the-counter or prescription medicines for pain, fever, or discomfort as directed by your caregiver.  Rinse your mouth (gargle) often with salt water ( tsp salt in 8 oz [250 ml] of warm water) to relieve pain or swelling.  Do not drive after taking pain medicine (narcotics).  Do not apply heat to the outside of your face.  Return to your dentist for further treatment as directed. SEEK MEDICAL CARE IF:  Your pain is not helped by  medicine.  Your pain is getting worse instead of better. SEEK IMMEDIATE MEDICAL CARE IF:  You have a fever or persistent symptoms for more than 2-3 days.  You have a fever and your symptoms suddenly get worse.  You have chills or a very bad headache.  You have problems breathing or swallowing.  You have trouble opening your mouth.  You have swelling in the neck or around the eye. Document Released: 07/10/2005 Document Revised: 04/03/2012 Document Reviewed: 10/18/2010 [facility] Patient Information 2015 [place], [place]. This information is not intended to replace advice given to you by your health care provider. Make sure you discuss any questions you have with your health care provider.

## 2014-08-05 ENCOUNTER — Emergency Department: Payer: Self-pay | Admitting: Emergency Medicine

## 2014-08-21 ENCOUNTER — Emergency Department (HOSPITAL_COMMUNITY): Payer: Self-pay

## 2014-08-21 ENCOUNTER — Emergency Department (HOSPITAL_COMMUNITY)
Admission: EM | Admit: 2014-08-21 | Discharge: 2014-08-21 | Disposition: A | Discharge status: Home / Self Care | Payer: Self-pay | Attending: Emergency Medicine | Admitting: Emergency Medicine

## 2014-08-21 ENCOUNTER — Encounter (HOSPITAL_COMMUNITY): Payer: Self-pay | Admitting: Emergency Medicine

## 2014-08-21 DIAGNOSIS — Z792 Long term (current) use of antibiotics: Secondary | ICD-10-CM | POA: Insufficient documentation

## 2014-08-21 DIAGNOSIS — M25561 Pain in right knee: Secondary | ICD-10-CM | POA: Insufficient documentation

## 2014-08-21 DIAGNOSIS — Z8719 Personal history of other diseases of the digestive system: Secondary | ICD-10-CM | POA: Insufficient documentation

## 2014-08-21 DIAGNOSIS — Z720 Tobacco use: Secondary | ICD-10-CM | POA: Insufficient documentation

## 2014-08-21 DIAGNOSIS — J45909 Unspecified asthma, uncomplicated: Secondary | ICD-10-CM | POA: Insufficient documentation

## 2014-08-21 DIAGNOSIS — I10 Essential (primary) hypertension: Secondary | ICD-10-CM | POA: Insufficient documentation

## 2014-08-21 DIAGNOSIS — Z8659 Personal history of other mental and behavioral disorders: Secondary | ICD-10-CM | POA: Insufficient documentation

## 2014-08-21 DIAGNOSIS — Z880 Allergy status to penicillin: Secondary | ICD-10-CM | POA: Insufficient documentation

## 2014-08-21 MED ORDER — MELOXICAM 7.5 MG PO TABS: 7.5000 mg | ORAL_TABLET | Freq: Every day | ORAL | Status: DC

## 2014-08-21 MED ORDER — HYDROCODONE-ACETAMINOPHEN 5-325 MG PO TABS: 2.0000 | ORAL_TABLET | ORAL | Status: DC | PRN

## 2014-08-21 MED ORDER — IBUPROFEN 800 MG PO TABS
800.0000 mg | ORAL_TABLET | Freq: Once | ORAL | Status: AC
  Administered 2014-08-21: 800 mg via ORAL
  Filled 2014-08-21: qty 1

## 2014-08-21 MED ORDER — HYDROCODONE-ACETAMINOPHEN 5-325 MG PO TABS
1.0000 | ORAL_TABLET | Freq: Once | ORAL | Status: AC
  Administered 2014-08-21: 1 via ORAL
  Filled 2014-08-21: qty 1

## 2014-08-21 NOTE — ED Provider Notes (Signed)
CSN: [id number]     Arrival date & time 08/21/14  1316 History  This chart was scribed for non-physician practitioner, Alyse Low, PA-C, working with Jasper Riling. Alvino Chapel, MD by Ladene Artist, ED Scribe. This patient was seen in room WTR7/WTR7 and the patient's care was started at 2:36 PM.   Chief Complaint  Patient presents with  . Knee Pain   No language interpreter was used.   HPI Comments: [name]. is a 34 y.o. male, with a h/o Crohn disease, asthma, HTN, bipolar disorder, substance abuse, anxiety, depression, who presents to the Emergency Department complaining of gradually worsening, constant R knee pain over the past 4-5 days. Pt states that pain is so severe that he has not been able to sleep for the past 2 nights. Pain is worse behind the knee cap and exacerbated with bending and ambulating. He reports associated warmth surrounding his R knee. Pt reports previous surgery to R knee.  Past Medical History  Diagnosis Date  . Crohn disease Dx 2004    terminal ileum Crohn's s/p ileocolectomy with anastamosis  . Bipolar 1 disorder     Self laceration and self harm behaviors   . Substance abuse   . Depression   . Anxiety   . Asthma     mild   . Hypertension    Past Surgical History  Procedure Laterality Date  . Colon surgery  01/17/2006  . Inguinal hernia repair      On the right and the left   . Tonsillectomy      20 yrs ago  . Esophagogastroduodenoscopy  09/04/2011    Procedure: ESOPHAGOGASTRODUODENOSCOPY (EGD);  Surgeon: Estanislado Emms., MD,FACG;  Location: [facility];  Service: Endoscopy;  Laterality: N/A;   Family History  Problem Relation Age of Onset  . Hypertension Other   . Diabetes Other    History  Substance Use Topics  . Smoking status: Current Every Day Smoker -- 0.50 packs/day    Types: Cigarettes  . Smokeless tobacco: Never Used  . Alcohol Use: Yes     Comment: occsionally    Review of Systems  Musculoskeletal: Positive for  arthralgias.  All other systems reviewed and are negative.  Allergies  Penicillins and Diphenhydramine hcl  Home Medications   Prior to Admission medications   Medication Sig Start Date End Date Taking? Authorizing Provider  azithromycin (ZITHROMAX) 250 MG tablet Take 1 tablet (250 mg total) by mouth daily. Take first 2 tablets together, then 1 every day until finished. 05/05/14   Tammy L. Triplett, PA-C  clindamycin (CLEOCIN) 150 MG capsule Take 2 capsules (300 mg total) by mouth 3 (three) times daily. 07/15/14   Orlie Dakin, MD  HYDROcodone-acetaminophen (NORCO/VICODIN) 5-325 MG per tablet Take one-two tabs po q 4-6 hrs prn pain 05/05/14   Tammy L. Triplett, PA-C  oxyCODONE-acetaminophen (PERCOCET) 5-325 MG per tablet Take 1-2 tablets by mouth every 6 (six) hours as needed. 07/15/14   Orlie Dakin, MD  predniSONE (DELTASONE) 10 MG tablet Take 6 tablets day one, 5 tablets day two, 4 tablets day three, 3 tablets day four, 2 tablets day five, then 1 tablet day six 05/05/14   Tammy L. Triplett, PA-C   BP 116/62 mmHg  Pulse 111  Temp(Src) 98.8 F (37.1 C) (Oral)  Resp 17  SpO2 95% Physical Exam  Constitutional: He is oriented to person, place, and time. He appears well-developed and well-nourished. No distress.  HENT:  Head: Normocephalic and atraumatic.  Eyes: Conjunctivae  and EOM are normal.  Neck: Neck supple.  Cardiovascular: Normal rate.   Pulmonary/Chest: Effort normal.  Musculoskeletal: Normal range of motion.  Healed lateral surgical incision. Pain to knee cap to lateral aspect of knee. Pain with ROM. No instability.   Neurological: He is alert and oriented to person, place, and time.  Skin: Skin is warm and dry.  Psychiatric: He has a normal mood and affect. His behavior is normal.  Nursing note and vitals reviewed.  ED Course  Procedures (including critical care time) DIAGNOSTIC STUDIES: Oxygen Saturation is 95% on RA, adequate by my interpretation.     COORDINATION OF CARE: 2:38 PM-Discussed treatment plan which includes XR with pt at bedside and pt agreed to plan.   Labs Review Labs Reviewed - No data to display  Imaging Review Dg Knee Complete 4 Views Right  08/21/2014   CLINICAL DATA:  34 year old male with right-sided anterior knee pain for 1 week.  EXAM: RIGHT KNEE - COMPLETE 4+ VIEW  COMPARISON:  No priors.  FINDINGS: Multiple views of the right knee demonstrate no acute displaced fracture, subluxation, dislocation, or soft tissue abnormality.  IMPRESSION: No acute radiographic abnormality of the right knee.   Electronically Signed   By: Vinnie Langton M.D.   On: 08/21/2014 14:38    EKG Interpretation None      MDM   Final diagnoses:  Knee pain, right    Knee sleeve meloxicam Hydrocodone Schedule to see Dr. Rhona Raider for evaluation I personally performed the services described in this documentation, which was scribed in my presence. The recorded information has been reviewed and is accurate.    Fransico Meadow, PA-C 08/21/14 [place] Alvino Chapel, MD 08/21/14 1531

## 2014-08-21 NOTE — ED Notes (Signed)
Ortho called

## 2014-08-21 NOTE — ED Notes (Signed)
AVS explained in detail. Given bus pass and ortho follow up information. Knee sleeve intact. Knows not to drink/drive with medications. No other questions/concerns.

## 2014-08-21 NOTE — Discharge Instructions (Signed)

## 2014-08-21 NOTE — ED Notes (Signed)
Pt c/o right knee pain x2 days. Hx knee surgery ("cleaned out cartilage"). Says "pain feels like it's behind the knee cap so every time I bend it I just want to cry. My knee feels hot to the touch." Took Aleve around 0900 with no alleviation of symptoms. Denies recent injury. No other issues/concerns.

## 2014-08-21 NOTE — Progress Notes (Signed)
Pt d/c 0332. At 1633 Pt returned to ED registration  "thirty minutes ago" per ED Registration staff stating he went to bus stop but was not allowed on because he did not have the bus pass.  Duplicate bus pass not available

## 2014-10-05 ENCOUNTER — Encounter (HOSPITAL_COMMUNITY): Payer: Self-pay | Admitting: Emergency Medicine

## 2014-10-05 ENCOUNTER — Inpatient Hospital Stay (HOSPITAL_COMMUNITY)
Admission: EM | Admit: 2014-10-05 | Discharge: 2014-10-07 | DRG: 871 | Disposition: A | Discharge status: Home / Self Care | Payer: Self-pay | Attending: Internal Medicine | Admitting: Internal Medicine

## 2014-10-05 ENCOUNTER — Emergency Department (HOSPITAL_COMMUNITY): Payer: Self-pay

## 2014-10-05 DIAGNOSIS — Z681 Body mass index (BMI) 19 or less, adult: Secondary | ICD-10-CM

## 2014-10-05 DIAGNOSIS — J189 Pneumonia, unspecified organism: Secondary | ICD-10-CM | POA: Diagnosis present

## 2014-10-05 DIAGNOSIS — Z880 Allergy status to penicillin: Secondary | ICD-10-CM

## 2014-10-05 DIAGNOSIS — F319 Bipolar disorder, unspecified: Secondary | ICD-10-CM | POA: Diagnosis present

## 2014-10-05 DIAGNOSIS — J1100 Influenza due to unidentified influenza virus with unspecified type of pneumonia: Secondary | ICD-10-CM | POA: Diagnosis present

## 2014-10-05 DIAGNOSIS — J4520 Mild intermittent asthma, uncomplicated: Secondary | ICD-10-CM

## 2014-10-05 DIAGNOSIS — R1084 Generalized abdominal pain: Secondary | ICD-10-CM | POA: Insufficient documentation

## 2014-10-05 DIAGNOSIS — K5000 Crohn's disease of small intestine without complications: Secondary | ICD-10-CM | POA: Diagnosis present

## 2014-10-05 DIAGNOSIS — A419 Sepsis, unspecified organism: Principal | ICD-10-CM | POA: Diagnosis present

## 2014-10-05 DIAGNOSIS — F17210 Nicotine dependence, cigarettes, uncomplicated: Secondary | ICD-10-CM | POA: Diagnosis present

## 2014-10-05 DIAGNOSIS — D696 Thrombocytopenia, unspecified: Secondary | ICD-10-CM | POA: Diagnosis present

## 2014-10-05 DIAGNOSIS — E43 Unspecified severe protein-calorie malnutrition: Secondary | ICD-10-CM | POA: Diagnosis present

## 2014-10-05 DIAGNOSIS — R1030 Lower abdominal pain, unspecified: Secondary | ICD-10-CM

## 2014-10-05 DIAGNOSIS — I10 Essential (primary) hypertension: Secondary | ICD-10-CM | POA: Diagnosis present

## 2014-10-05 DIAGNOSIS — R109 Unspecified abdominal pain: Secondary | ICD-10-CM

## 2014-10-05 DIAGNOSIS — J45909 Unspecified asthma, uncomplicated: Secondary | ICD-10-CM | POA: Diagnosis present

## 2014-10-05 LAB — CBC WITH DIFFERENTIAL/PLATELET
Basophils Absolute: 0 10*3/uL (ref 0.0–0.1)
Basophils Relative: 0 % (ref 0–1)
EOS PCT: 0 % (ref 0–5)
Eosinophils Absolute: 0 10*3/uL (ref 0.0–0.7)
HCT: 48 % (ref 39.0–52.0)
HEMOGLOBIN: 16.8 g/dL (ref 13.0–17.0)
Lymphocytes Relative: 17 % (ref 12–46)
Lymphs Abs: 2 10*3/uL (ref 0.7–4.0)
MCH: 33.9 pg (ref 26.0–34.0)
MCHC: 35 g/dL (ref 30.0–36.0)
MCV: 96.8 fL (ref 78.0–100.0)
Monocytes Absolute: 0.8 10*3/uL (ref 0.1–1.0)
Monocytes Relative: 7 % (ref 3–12)
NEUTROS ABS: 8.7 10*3/uL — AB (ref 1.7–7.7)
Neutrophils Relative %: 76 % (ref 43–77)
Platelets: 154 10*3/uL (ref 150–400)
RBC: 4.96 MIL/uL (ref 4.22–5.81)
RDW: 12.3 % (ref 11.5–15.5)
WBC: 11.4 10*3/uL — ABNORMAL HIGH (ref 4.0–10.5)

## 2014-10-05 LAB — COMPREHENSIVE METABOLIC PANEL
ALT: 87 U/L — ABNORMAL HIGH (ref 0–53)
AST: 81 U/L — ABNORMAL HIGH (ref 0–37)
Albumin: 3.8 g/dL (ref 3.5–5.2)
Alkaline Phosphatase: 110 U/L (ref 39–117)
Anion gap: 10 (ref 5–15)
BUN: 9 mg/dL (ref 6–23)
CO2: 27 mmol/L (ref 19–32)
Calcium: 9.5 mg/dL (ref 8.4–10.5)
Chloride: 96 mmol/L (ref 96–112)
Creatinine, Ser: 1.01 mg/dL (ref 0.50–1.35)
GFR calc Af Amer: >90 mL/min (ref >90)
GFR calc non Af Amer: >90 mL/min (ref >90)
GLUCOSE: 83 mg/dL (ref 70–99)
POTASSIUM: 3.5 mmol/L (ref 3.5–5.1)
Sodium: 133 mmol/L — ABNORMAL LOW (ref 135–145)
Total Bilirubin: 0.6 mg/dL (ref 0.3–1.2)
Total Protein: 7.8 g/dL (ref 6.0–8.3)

## 2014-10-05 LAB — URINALYSIS, ROUTINE W REFLEX MICROSCOPIC
GLUCOSE, UA: NEGATIVE mg/dL
HGB URINE DIPSTICK: NEGATIVE
Ketones, ur: 15 mg/dL — AB
LEUKOCYTES UA: NEGATIVE
Nitrite: NEGATIVE
PH: 6 (ref 5.0–8.0)
Protein, ur: 30 mg/dL — AB
Specific Gravity, Urine: 1.02 (ref 1.005–1.030)
Urobilinogen, UA: 1 mg/dL (ref 0.0–1.0)

## 2014-10-05 LAB — RAPID URINE DRUG SCREEN, HOSP PERFORMED
Amphetamines: NOT DETECTED
Barbiturates: NOT DETECTED
Benzodiazepines: NOT DETECTED
COCAINE: POSITIVE — AB
OPIATES: NOT DETECTED
Tetrahydrocannabinol: POSITIVE — AB

## 2014-10-05 LAB — URINE MICROSCOPIC-ADD ON

## 2014-10-05 LAB — I-STAT CG4 LACTIC ACID, ED: Lactic Acid, Venous: 1.38 mmol/L (ref 0.5–2.0)

## 2014-10-05 LAB — PROCALCITONIN: PROCALCITONIN: 0.9 ng/mL

## 2014-10-05 LAB — LIPASE, BLOOD: Lipase: 37 U/L (ref 11–59)

## 2014-10-05 MED ORDER — SODIUM CHLORIDE 0.9 % IV BOLUS (SEPSIS)
1000.0000 mL | Freq: Once | INTRAVENOUS | Status: AC
  Administered 2014-10-05: 1000 mL via INTRAVENOUS

## 2014-10-05 MED ORDER — IOHEXOL 300 MG/ML  SOLN: 25.0000 mL | Freq: Once | INTRAMUSCULAR | Status: AC | PRN

## 2014-10-05 MED ORDER — ONDANSETRON HCL 4 MG/2ML IJ SOLN
4.0000 mg | Freq: Once | INTRAMUSCULAR | Status: AC
  Administered 2014-10-05: 4 mg via INTRAVENOUS
  Filled 2014-10-05: qty 2

## 2014-10-05 MED ORDER — FENTANYL CITRATE 0.05 MG/ML IJ SOLN
50.0000 ug | Freq: Once | INTRAMUSCULAR | Status: AC
  Administered 2014-10-05: 50 ug via INTRAVENOUS
  Filled 2014-10-05: qty 2

## 2014-10-05 MED ORDER — LEVOFLOXACIN IN D5W 750 MG/150ML IV SOLN
750.0000 mg | Freq: Once | INTRAVENOUS | Status: AC
  Administered 2014-10-05: 750 mg via INTRAVENOUS
  Filled 2014-10-05: qty 150

## 2014-10-05 MED ORDER — IOHEXOL 300 MG/ML  SOLN
80.0000 mL | Freq: Once | INTRAMUSCULAR | Status: AC | PRN
  Administered 2014-10-05: 80 mL via INTRAVENOUS

## 2014-10-05 MED ORDER — IPRATROPIUM-ALBUTEROL 0.5-2.5 (3) MG/3ML IN SOLN
3.0000 mL | Freq: Four times a day (QID) | RESPIRATORY_TRACT | Status: DC
  Administered 2014-10-06 – 2014-10-07 (×6): 3 mL via RESPIRATORY_TRACT
  Filled 2014-10-05 (×6): qty 3

## 2014-10-05 MED ORDER — LEVOFLOXACIN IN D5W 750 MG/150ML IV SOLN
750.0000 mg | INTRAVENOUS | Status: DC
  Administered 2014-10-06: 750 mg via INTRAVENOUS
  Filled 2014-10-05 (×2): qty 150

## 2014-10-05 MED ORDER — IPRATROPIUM-ALBUTEROL 0.5-2.5 (3) MG/3ML IN SOLN
3.0000 mL | Freq: Once | RESPIRATORY_TRACT | Status: AC
  Administered 2014-10-05: 3 mL via RESPIRATORY_TRACT
  Filled 2014-10-05: qty 3

## 2014-10-05 MED ORDER — SODIUM CHLORIDE 0.9 % IV BOLUS (SEPSIS)
1000.0000 mL | Freq: Once | INTRAVENOUS | Status: AC
  Administered 2014-10-06: 1000 mL via INTRAVENOUS

## 2014-10-05 MED ORDER — ENOXAPARIN SODIUM 40 MG/0.4ML ~~LOC~~ SOLN
40.0000 mg | SUBCUTANEOUS | Status: DC
Start: 2014-10-06 — End: 2014-10-07
  Administered 2014-10-06 – 2014-10-07 (×2): 40 mg via SUBCUTANEOUS
  Filled 2014-10-05 (×3): qty 0.4

## 2014-10-05 MED ORDER — HYDROCODONE-ACETAMINOPHEN 5-325 MG PO TABS
2.0000 | ORAL_TABLET | ORAL | Status: DC | PRN
  Administered 2014-10-06 – 2014-10-07 (×7): 2 via ORAL
  Filled 2014-10-05 (×8): qty 2

## 2014-10-05 MED ORDER — MORPHINE SULFATE 4 MG/ML IJ SOLN
4.0000 mg | Freq: Once | INTRAMUSCULAR | Status: AC
  Administered 2014-10-05: 4 mg via INTRAVENOUS
  Filled 2014-10-05: qty 1

## 2014-10-05 MED ORDER — SODIUM CHLORIDE 0.9 % IV SOLN
INTRAVENOUS | Status: AC
  Administered 2014-10-06: 01:00:00 via INTRAVENOUS

## 2014-10-05 NOTE — ED Notes (Signed)
Admitting MD at bedside.

## 2014-10-05 NOTE — ED Notes (Signed)
Pt at xray, mother waiting in room

## 2014-10-05 NOTE — ED Notes (Signed)
Pt sts cough x 1 week; pt sts coughed really hard and thinks that he may have caused a hernia again; pt sts fixed x 3 in past

## 2014-10-05 NOTE — ED Notes (Signed)
Attempt to call report to floor x1.

## 2014-10-05 NOTE — ED Provider Notes (Signed)
  Physical Exam  BP 98/73 mmHg  Pulse 100  Temp(Src) 98.7 F (37.1 C) (Oral)  Resp 18  Ht 5' 6"  (1.676 m)  Wt 92 lb 9 oz (41.986 kg)  BMI 14.95 kg/m2  SpO2 94%  Physical Exam  ED Course  Procedures  MDM Assumed care of pt from Dr. Darl Householder.  Briefly, pt is a 34 y.o. male presenting with sepsis, PNA.  Has received levaquin.  On assumption of care awaiting CT of abd.    CT scan unremarkable with exception of redemonstrated PNA.  Admitted pt for continued hypotension.     Debby Freiberg, MD 10/05/14 2258

## 2014-10-05 NOTE — ED Provider Notes (Signed)
CSN: [id number]     Arrival date & time 10/05/14  1223 History   First MD Initiated Contact with Patient 10/05/14 1416     Chief Complaint  Patient presents with  . Groin Pain  . Cough     (Consider location/radiation/quality/duration/timing/severity/associated sxs/prior Treatment) The history is provided by the patient.  Myrtie Hawk Cowan Pilar. is a 34 y.o. male hx of crohn's disease s/p ileocolectomy with anastamosis, recurrent inguinal hernia here with cough, abdominal pain.  He has been having productive cough with greenish sputum for the last week.  States that he has been coughing so much that he felt a bulge in his abdomen 2 days ago.  He has perfuse vomiting since then.  He has chronic diarrhea but abdomen 10 episodes of diarrhea since yesterday. Has subjective chills as well. Has multiple abdominal surgeries in the past.   Patient saw Dr. Olevia Perches for GI in the past but doesn't have insurance so is lost to follow up. Also has no PMD currently.   Past Medical History  Diagnosis Date  . Crohn disease Dx 2004    terminal ileum Crohn's s/p ileocolectomy with anastamosis  . Bipolar 1 disorder     Self laceration and self harm behaviors   . Substance abuse   . Depression   . Anxiety   . Asthma     mild   . Hypertension    Past Surgical History  Procedure Laterality Date  . Colon surgery  01/17/2006  . Inguinal hernia repair      On the right and the left   . Tonsillectomy      20 yrs ago  . Esophagogastroduodenoscopy  09/04/2011    Procedure: ESOPHAGOGASTRODUODENOSCOPY (EGD);  Surgeon: Estanislado Emms., MD,FACG;  Location: [facility];  Service: Endoscopy;  Laterality: N/A;   Family History  Problem Relation Age of Onset  . Hypertension Other   . Diabetes Other    History  Substance Use Topics  . Smoking status: Current Every Day Smoker -- 0.50 packs/day    Types: Cigarettes  . Smokeless tobacco: Never Used  . Alcohol Use: Yes     Comment: occsionally     Review of Systems  Respiratory: Positive for cough.   Gastrointestinal: Positive for nausea, vomiting, abdominal pain and diarrhea.  All other systems reviewed and are negative.     Allergies  Penicillins and Diphenhydramine hcl  Home Medications   Prior to Admission medications   Medication Sig Start Date End Date Taking? Authorizing Provider  azithromycin (ZITHROMAX) 250 MG tablet Take 1 tablet (250 mg total) by mouth daily. Take first 2 tablets together, then 1 every day until finished. 05/05/14   Tammi Triplett, PA-C  clindamycin (CLEOCIN) 150 MG capsule Take 2 capsules (300 mg total) by mouth 3 (three) times daily. 07/15/14   Orlie Dakin, MD  HYDROcodone-acetaminophen (NORCO/VICODIN) 5-325 MG per tablet Take 2 tablets by mouth every 4 (four) hours as needed. 08/21/14   Fransico Meadow, PA-C  meloxicam (MOBIC) 7.5 MG tablet Take 1 tablet (7.5 mg total) by mouth daily. 08/21/14   Fransico Meadow, PA-C  oxyCODONE-acetaminophen (PERCOCET) 5-325 MG per tablet Take 1-2 tablets by mouth every 6 (six) hours as needed. 07/15/14   Orlie Dakin, MD  predniSONE (DELTASONE) 10 MG tablet Take 6 tablets day one, 5 tablets day two, 4 tablets day three, 3 tablets day four, 2 tablets day five, then 1 tablet day six 05/05/14   Tammi Triplett, PA-C   BP  98/73 mmHg  Pulse 100  Temp(Src) 98.7 F (37.1 C) (Oral)  Resp 18  Ht 5' 6"  (1.676 m)  Wt 92 lb 9 oz (41.986 kg)  BMI 14.95 kg/m2  SpO2 94% Physical Exam  Constitutional: He is oriented to person, place, and time.  Uncomfortable   HENT:  Head: Normocephalic.  MM dry   Eyes: Conjunctivae are normal. Pupils are equal, round, and reactive to light.  Neck: Normal range of motion. Neck supple.  Cardiovascular: Regular rhythm and normal heart sounds.   Tachycardic   Pulmonary/Chest:  + tachypneic, + diffuse wheezing   Abdominal:  Previous midline abdominal scar healing well. + LLQ tenderness, no obvious inguinal hernia. Nontender  testicles   Musculoskeletal: Normal range of motion. He exhibits no edema or tenderness.  Neurological: He is alert and oriented to person, place, and time. No cranial nerve deficit. Coordination normal.  Skin: Skin is warm and dry.  Psychiatric: He has a normal mood and affect. His behavior is normal. Judgment and thought content normal.  Nursing note and vitals reviewed.   ED Course  Procedures (including critical care time) Labs Review Labs Reviewed  CBC WITH DIFFERENTIAL/PLATELET - Abnormal; Notable for the following:    WBC 11.4 (*)    Neutro Abs 8.7 (*)    All other components within normal limits  URINALYSIS, ROUTINE W REFLEX MICROSCOPIC - Abnormal; Notable for the following:    Color, Urine AMBER (*)    APPearance CLOUDY (*)    Bilirubin Urine SMALL (*)    Ketones, ur 15 (*)    Protein, ur 30 (*)    All other components within normal limits  CULTURE, BLOOD (ROUTINE X 2)  CULTURE, BLOOD (ROUTINE X 2)  CLOSTRIDIUM DIFFICILE BY PCR  URINE MICROSCOPIC-ADD ON  COMPREHENSIVE METABOLIC PANEL  LIPASE, BLOOD    Imaging Review Dg Chest 2 View (if Patient Has Fever And/or Copd)  10/05/2014   CLINICAL DATA:  Cough and fever for 1 week.  Smoker.  EXAM: CHEST  2 VIEW  COMPARISON:  PA and lateral chest 07/22/2011 and 05/05/2014.  FINDINGS: The chest is hyperexpanded with attenuation of the pulmonary vasculature. There is right middle lobe airspace disease most consistent with pneumonia. The left lung is clear. No pneumothorax or pleural effusion.  IMPRESSION: Right middle lobe airspace disease consistent with pneumonia.  Emphysema.   Electronically Signed   By: Inge Rise M.D.   On: 10/05/2014 14:29     EKG Interpretation None      MDM   Final diagnoses:  Abdominal pain   Dupree Givler Katlin Ciszewski. is a 34 y.o. male here with cough, vomiting, diarrhea. Tachy and hypotensive on arrival. Concerned for sepsis. Will do sepsis workup. Also consider SBO vs internal hernia. Will  get CT ab/pel.   4:15 PM CXR showed pneumonia. WBC 11. Given levaquin. Tachycardia improved, BP improved with IVF. CT ab/pel pending. Sign out to Dr. Colin Rhein to f/u CT and reassess patient.      Wandra Arthurs, MD 10/05/14 [phone]

## 2014-10-05 NOTE — H&P (Signed)
PCP: none GI used to see Dr. Olevia Perches but stopped due to insurance issues   Chief Complaint:  Something poped  HPI: [name]. is a 34 y.o. male   has a past medical history of Crohn disease (Dx 2004); Bipolar 1 disorder; Substance abuse; Depression; Anxiety; Asthma; and Hypertension.    34 yo M with hx of crohn's disease and bipolar disorder with polysubstance abuse here with severe cough for 1 week He was coughing so hard he was worried that he caused himself a hernia and presented to ER. Patient reports some rib pain. He was found to be febrile meeting sepsis criteria. With CXR worrisome for RML PNA Ct of the abdomen showed no evidence of hernia. Patient reports he started to have nausea and vomiting after severe bout of coughing, reports chronically soft stools no significant diarrhea.  Lactic acid 1.38 Given Levaquin in ER Hospitalist was called for admission for CAP and sepsis Review of Systems:    Pertinent positives include: Fevers, chills nausea, vomiting productive cough,  Constitutional:  No weight loss, night sweats, fatigue, weight loss  HEENT:  No headaches, Difficulty swallowing,Tooth/dental problems,Sore throat,  No sneezing, itching, ear ache, nasal congestion, post nasal drip,  Cardio-vascular:  No chest pain, Orthopnea, PND, anasarca, dizziness, palpitations.no Bilateral lower extremity swelling  GI:  No heartburn, indigestion, abdominal pain,, diarrhea, change in bowel habits, loss of appetite, melena, blood in stool, hematemesis Resp:  no shortness of breath at rest. No dyspnea on exertion, No excess mucus, no  No non-productive cough, No coughing up of blood.No change in color of mucus.No wheezing. Skin:  no rash or lesions. No jaundice GU:  no dysuria, change in color of urine, no urgency or frequency. No straining to urinate.  No flank pain.  Musculoskeletal:  No joint pain or no joint swelling. No decreased range of motion. No back pain.    Psych:  No change in mood or affect. No depression or anxiety. No memory loss.  Neuro: no localizing neurological complaints, no tingling, no weakness, no double vision, no gait abnormality, no slurred speech, no confusion  Otherwise ROS are negative except for above, 10 systems were reviewed  Past Medical History: Past Medical History  Diagnosis Date  . Crohn disease Dx 2004    terminal ileum Crohn's s/p ileocolectomy with anastamosis  . Bipolar 1 disorder     Self laceration and self harm behaviors   . Substance abuse   . Depression   . Anxiety   . Asthma     mild   . Hypertension    Past Surgical History  Procedure Laterality Date  . Colon surgery  01/17/2006  . Inguinal hernia repair      On the right and the left   . Tonsillectomy      20 yrs ago  . Esophagogastroduodenoscopy  09/04/2011    Procedure: ESOPHAGOGASTRODUODENOSCOPY (EGD);  Surgeon: Estanislado Emms., MD,FACG;  Location: [facility];  Service: Endoscopy;  Laterality: N/A;     Medications: Prior to Admission medications   Medication Sig Start Date End Date Taking? Authorizing Provider  azithromycin (ZITHROMAX) 250 MG tablet Take 1 tablet (250 mg total) by mouth daily. Take first 2 tablets together, then 1 every day until finished. 05/05/14   Tammi Triplett, PA-C  clindamycin (CLEOCIN) 150 MG capsule Take 2 capsules (300 mg total) by mouth 3 (three) times daily. 07/15/14   Orlie Dakin, MD  HYDROcodone-acetaminophen (NORCO/VICODIN) 5-325 MG per tablet Take 2 tablets  by mouth every 4 (four) hours as needed. 08/21/14   Fransico Meadow, PA-C    Allergies:   Allergies  Allergen Reactions  . Penicillins Anaphylaxis and Swelling  . Diphenhydramine Hcl Hives and Rash    Social History:  Ambulatory  Independently Lives at home  With family     reports that he has been smoking Cigarettes.  He has been smoking about 0.50 packs per day. He has never used smokeless tobacco. He reports that he drinks alcohol.  He reports that he uses illicit drugs (Marijuana) about twice per week.    Family History: family history includes Diabetes in his other; Hypertension in his other.    Physical Exam: Patient Vitals for the past 24 hrs:  BP Temp Temp src Pulse Resp SpO2 Height Weight  10/05/14 1915 116/66 mmHg - - 109 16 93 % - -  10/05/14 1900 96/58 mmHg - - 95 - 93 % - -  10/05/14 1845 99/63 mmHg - - 99 - 95 % - -  10/05/14 1730 (!) 86/59 mmHg - - 95 18 97 % - -  10/05/14 1726 - - - 98 - 94 % - -  10/05/14 1700 (!) 83/60 mmHg - - 109 18 95 % - -  10/05/14 1615 91/65 mmHg - - 107 18 94 % - -  10/05/14 1600 - - - 107 - 94 % - -  10/05/14 1550 - - - 100 - 94 % - -  10/05/14 1546 100/70 mmHg - - - - - - -  10/05/14 1530 98/73 mmHg - - - 16 - - -  10/05/14 1515 94/71 mmHg - - 113 - 100 % - -  10/05/14 1504 - - - (!) 122 - 96 % - -  10/05/14 1450 - - - 116 - 97 % - -  10/05/14 1445 111/79 mmHg - - - - - - -  10/05/14 1431 - - - 110 - 95 % - -  10/05/14 1430 92/63 mmHg - - - - - - -  10/05/14 1245 112/60 mmHg 98.7 F (37.1 C) Oral 114 18 94 % 5' 6"  (1.676 m) 41.986 kg (92 lb 9 oz)    1. General:  in No Acute distress 2. Psychological: Alert and  Oriented 3. Head/ENT:    Dry Mucous Membranes                          Head Non traumatic, neck supple                          Normal Dentition 4. SKIN:  decreased Skin turgor,  Skin clean Dry and intact no rash 5. Heart: Regular rate and rhythm no Murmur, Rub or gallop 6. Lungs:   no wheezes occasional crackles   7. Abdomen: Soft,  superficial tenderness, Non distended 8. Lower extremities: no clubbing, cyanosis, or edema 9. Neurologically Grossly intact, moving all 4 extremities equally 10. MSK: Normal range of motion  body mass index is 14.95 kg/(m^2).   Labs on Admission:   Results for orders placed or performed during the hospital encounter of 10/05/14 (from the past 24 hour(s))  CBC with Differential     Status: Abnormal   Collection Time:  10/05/14  2:53 PM  Result Value Ref Range   WBC 11.4 (H) 4.0 - 10.5 K/uL   RBC 4.96 4.22 - 5.81 MIL/uL   Hemoglobin 16.8 13.0 -  17.0 g/dL   HCT 48.0 39.0 - 52.0 %   MCV 96.8 78.0 - 100.0 fL   MCH 33.9 26.0 - 34.0 pg   MCHC 35.0 30.0 - 36.0 g/dL   RDW 12.3 11.5 - 15.5 %   Platelets 154 150 - 400 K/uL   Neutrophils Relative % 76 43 - 77 %   Neutro Abs 8.7 (H) 1.7 - 7.7 K/uL   Lymphocytes Relative 17 12 - 46 %   Lymphs Abs 2.0 0.7 - 4.0 K/uL   Monocytes Relative 7 3 - 12 %   Monocytes Absolute 0.8 0.1 - 1.0 K/uL   Eosinophils Relative 0 0 - 5 %   Eosinophils Absolute 0.0 0.0 - 0.7 K/uL   Basophils Relative 0 0 - 1 %   Basophils Absolute 0.0 0.0 - 0.1 K/uL  Comprehensive metabolic panel     Status: Abnormal   Collection Time: 10/05/14  2:53 PM  Result Value Ref Range   Sodium 133 (L) 135 - 145 mmol/L   Potassium 3.5 3.5 - 5.1 mmol/L   Chloride 96 96 - 112 mmol/L   CO2 27 19 - 32 mmol/L   Glucose, Bld 83 70 - 99 mg/dL   BUN 9 6 - 23 mg/dL   Creatinine, Ser 1.01 0.50 - 1.35 mg/dL   Calcium 9.5 8.4 - 10.5 mg/dL   Total Protein 7.8 6.0 - 8.3 g/dL   Albumin 3.8 3.5 - 5.2 g/dL   AST 81 (H) 0 - 37 U/L   ALT 87 (H) 0 - 53 U/L   Alkaline Phosphatase 110 39 - 117 U/L   Total Bilirubin 0.6 0.3 - 1.2 mg/dL   GFR calc non Af Amer >90 >90 mL/min   GFR calc Af Amer >90 >90 mL/min   Anion gap 10 5 - 15  Lipase, blood     Status: None   Collection Time: 10/05/14  2:53 PM  Result Value Ref Range   Lipase 37 11 - 59 U/L  Urinalysis, Routine w reflex microscopic     Status: Abnormal   Collection Time: 10/05/14  3:01 PM  Result Value Ref Range   Color, Urine AMBER (A) YELLOW   APPearance CLOUDY (A) CLEAR   Specific Gravity, Urine 1.020 1.005 - 1.030   pH 6.0 5.0 - 8.0   Glucose, UA NEGATIVE NEGATIVE mg/dL   Hgb urine dipstick NEGATIVE NEGATIVE   Bilirubin Urine SMALL (A) NEGATIVE   Ketones, ur 15 (A) NEGATIVE mg/dL   Protein, ur 30 (A) NEGATIVE mg/dL   Urobilinogen, UA 1.0 0.0 - 1.0  mg/dL   Nitrite NEGATIVE NEGATIVE   Leukocytes, UA NEGATIVE NEGATIVE  Urine microscopic-add on     Status: None   Collection Time: 10/05/14  3:01 PM  Result Value Ref Range   Squamous Epithelial / LPF RARE RARE   WBC, UA 0-2 <3 WBC/hpf   RBC / HPF 0-2 <3 RBC/hpf   Bacteria, UA RARE RARE   Urine-Other MUCOUS PRESENT   I-Stat CG4 Lactic Acid, ED     Status: None   Collection Time: 10/05/14  7:50 PM  Result Value Ref Range   Lactic Acid, Venous 1.38 0.5 - 2.0 mmol/L    UA no evidence of UTi  Lab Results  Component Value Date   HGBA1C  07/28/2009    5.1 (NOTE) The ADA recommends the following therapeutic goal for glycemic control related to Hgb A1c measurement: Goal of therapy: <6.5 Hgb A1c  Reference: American Diabetes Association: Clinical Practice  Recommendations 2010, Diabetes Care, 2010, 33: (Suppl  1).    Estimated Creatinine Clearance: 61.2 mL/min (by C-G formula based on Cr of 1.01).  BNP (last 3 results) No results for input(s): PROBNP in the last 8760 hours.  Other results:  I have pearsonaly reviewed this: ECG not obtained   Filed Weights   10/05/14 1245  Weight: 41.986 kg (92 lb 9 oz)     Cultures:    Component Value Date/Time   SDES URINE, CLEAN CATCH 06/29/2009 1808   Exira ADDED [id number] 1950 06/29/2009 1808   CULT NO GROWTH 06/29/2009 1808   REPTSTATUS 06/30/2009 FINAL 06/29/2009 1808     Radiological Exams on Admission: Dg Chest 2 View (if Patient Has Fever And/or Copd)  10/05/2014   CLINICAL DATA:  Cough and fever for 1 week.  Smoker.  EXAM: CHEST  2 VIEW  COMPARISON:  PA and lateral chest 07/22/2011 and 05/05/2014.  FINDINGS: The chest is hyperexpanded with attenuation of the pulmonary vasculature. There is right middle lobe airspace disease most consistent with pneumonia. The left lung is clear. No pneumothorax or pleural effusion.  IMPRESSION: Right middle lobe airspace disease consistent with pneumonia.  Emphysema.   Electronically Signed    By: Inge Rise M.D.   On: 10/05/2014 14:29   Ct Abdomen Pelvis W Contrast  10/05/2014   CLINICAL DATA:  Acute onset of cough and abdominal pain. Initial encounter.  EXAM: CT ABDOMEN AND PELVIS WITH CONTRAST  TECHNIQUE: Multidetector CT imaging of the abdomen and pelvis was performed using the standard protocol following bolus administration of intravenous contrast.  CONTRAST:  61m OMNIPAQUE IOHEXOL 300 MG/ML  SOLN  COMPARISON:  CT of the abdomen and pelvis performed 12/23/2012  FINDINGS: Dense focal right middle lobe pneumonia is noted. Additional patchy right lower lobe opacity may reflect atelectasis or additional pneumonia. Lucency within the lung fields raises concern for emphysematous change, unusual in a patient of this age.  The liver and spleen are unremarkable in appearance. The gallbladder is within normal limits. The pancreas and adrenal glands are unremarkable.  A 1.2 cm cyst is noted at the upper pole of the right kidney. There is no evidence of hydronephrosis. No renal or ureteral stones are seen. No perinephric stranding is appreciated.  No free fluid is identified. The small bowel is unremarkable in appearance. The stomach is within normal limits. No acute vascular abnormalities are seen.  The appendix is not definitely characterized; there is no evidence of appendicitis. There is no definite evidence for acute exacerbation of the patient's Crohn's disease. The colon is partially filled with contrast and air, and is unremarkable in appearance.  The bladder is mildly distended and grossly unremarkable in appearance. The prostate remains normal in size. No inguinal lymphadenopathy is seen.  No acute osseous abnormalities are identified.  IMPRESSION: 1. Dense focal right middle lobe pneumonia noted. 2. Lucency within the lung fields raises concern for emphysematous change, unusual in a patient of this age. Would correlate clinically for chronic symptoms. 3. No evidence of hernia. The patient  has no evidence of hernia on studies dating back to 2005. 4. Small right renal cyst noted. 5. No evidence of acute exacerbation of the patient's Crohn's disease.   Electronically Signed   By: JGarald BaldingM.D.   On: 10/05/2014 18:43    Chart has been reviewed  Assessment/Plan  34yo M with hx of polysubstance abuse. Here with Sepsis secondary to CAP  Present on Admission:  . Sepsis patient's  vital signs currently improving. Admit to telemetry. Lactic acid appear to be with in normal limits. Admit for IV fluids and IV antibiotics.  [name] Kitchen CAP (community acquired pneumonia) -  - will admit for treatment of CAP will start on appropriate antibiotic coverage.   Obtain sputum cultures, blood cultures if febrile or if decompensates.  Provide oxygen as needed.  . Asthma chronic continue albuterol as needed currently appears to be stable Crohn's currently appears to be stable and no evidence of exacerbation Abdominal pain. Exam appears to musculoskeletal CT scan shows no evidence of intra-abdominal infection   Prophylaxis:  Lovenox,    CODE STATUS:  FULL CODE   Other plan as per orders.  I have spent a total of 55 min on this admission  Rithwik Schmieg 10/05/2014, 8:12 PM  [facility]  Pager [phone]   after 2 AM please page floor coverage PA If 7AM-7PM, please contact the day team taking care of the patient  [email or web address]  Password [email or web address]

## 2014-10-05 NOTE — ED Notes (Signed)
Attempt to call report to floor x2.

## 2014-10-05 NOTE — ED Notes (Signed)
Patient in Xray. Family at the bedside.

## 2014-10-05 NOTE — ED Notes (Signed)
Pt back from CT

## 2014-10-05 NOTE — ED Notes (Signed)
Ok for pt to eat per EDP

## 2014-10-06 DIAGNOSIS — J1100 Influenza due to unidentified influenza virus with unspecified type of pneumonia: Secondary | ICD-10-CM

## 2014-10-06 DIAGNOSIS — E43 Unspecified severe protein-calorie malnutrition: Secondary | ICD-10-CM | POA: Diagnosis present

## 2014-10-06 LAB — CBC WITH DIFFERENTIAL/PLATELET
BASOS PCT: 0 % (ref 0–1)
Basophils Absolute: 0 10*3/uL (ref 0.0–0.1)
EOS ABS: 0 10*3/uL (ref 0.0–0.7)
EOS PCT: 0 % (ref 0–5)
HEMATOCRIT: 34.7 % — AB (ref 39.0–52.0)
Hemoglobin: 12.2 g/dL — ABNORMAL LOW (ref 13.0–17.0)
Lymphocytes Relative: 37 % (ref 12–46)
Lymphs Abs: 2.1 10*3/uL (ref 0.7–4.0)
MCH: 34.2 pg — ABNORMAL HIGH (ref 26.0–34.0)
MCHC: 35.2 g/dL (ref 30.0–36.0)
MCV: 97.2 fL (ref 78.0–100.0)
MONO ABS: 0.4 10*3/uL (ref 0.1–1.0)
Monocytes Relative: 7 % (ref 3–12)
Neutro Abs: 3.2 10*3/uL (ref 1.7–7.7)
Neutrophils Relative %: 55 % (ref 43–77)
PLATELETS: 112 10*3/uL — AB (ref 150–400)
RBC: 3.57 MIL/uL — ABNORMAL LOW (ref 4.22–5.81)
RDW: 12.4 % (ref 11.5–15.5)
WBC: 5.7 10*3/uL (ref 4.0–10.5)

## 2014-10-06 LAB — COMPREHENSIVE METABOLIC PANEL
ALT: 49 U/L (ref 0–53)
ANION GAP: 6 (ref 5–15)
AST: 45 U/L — ABNORMAL HIGH (ref 0–37)
Albumin: 2.5 g/dL — ABNORMAL LOW (ref 3.5–5.2)
Alkaline Phosphatase: 70 U/L (ref 39–117)
BUN: 7 mg/dL (ref 6–23)
CALCIUM: 7.6 mg/dL — AB (ref 8.4–10.5)
CO2: 20 mmol/L (ref 19–32)
Chloride: 108 mmol/L (ref 96–112)
Creatinine, Ser: 0.83 mg/dL (ref 0.50–1.35)
GLUCOSE: 87 mg/dL (ref 70–99)
Potassium: 3.5 mmol/L (ref 3.5–5.1)
Sodium: 134 mmol/L — ABNORMAL LOW (ref 135–145)
Total Bilirubin: 0.2 mg/dL — ABNORMAL LOW (ref 0.3–1.2)
Total Protein: 4.8 g/dL — ABNORMAL LOW (ref 6.0–8.3)

## 2014-10-06 LAB — INFLUENZA PANEL BY PCR (TYPE A & B)
H1N1 flu by pcr: DETECTED — AB
INFLBPCR: NEGATIVE
Influenza A By PCR: POSITIVE — AB

## 2014-10-06 LAB — STREP PNEUMONIAE URINARY ANTIGEN: Strep Pneumo Urinary Antigen: NEGATIVE

## 2014-10-06 LAB — CLOSTRIDIUM DIFFICILE BY PCR: Toxigenic C. Difficile by PCR: NEGATIVE

## 2014-10-06 MED ORDER — INFLUENZA VAC SPLIT QUAD 0.5 ML IM SUSY
0.5000 mL | PREFILLED_SYRINGE | INTRAMUSCULAR | Status: AC
  Administered 2014-10-07: 0.5 mL via INTRAMUSCULAR
  Filled 2014-10-06: qty 0.5

## 2014-10-06 MED ORDER — OSELTAMIVIR PHOSPHATE 75 MG PO CAPS
75.0000 mg | ORAL_CAPSULE | Freq: Two times a day (BID) | ORAL | Status: DC
  Administered 2014-10-06 – 2014-10-07 (×3): 75 mg via ORAL
  Filled 2014-10-06 (×4): qty 1

## 2014-10-06 MED ORDER — PNEUMOCOCCAL VAC POLYVALENT 25 MCG/0.5ML IJ INJ
0.5000 mL | INJECTION | INTRAMUSCULAR | Status: AC
  Administered 2014-10-07: 0.5 mL via INTRAMUSCULAR
  Filled 2014-10-06: qty 0.5

## 2014-10-06 MED ORDER — ENSURE COMPLETE PO LIQD
237.0000 mL | Freq: Two times a day (BID) | ORAL | Status: DC
  Administered 2014-10-06 – 2014-10-07 (×3): 237 mL via ORAL

## 2014-10-06 NOTE — Progress Notes (Signed)
UR Completed.  [phone]  

## 2014-10-06 NOTE — Progress Notes (Signed)
INITIAL NUTRITION ASSESSMENT  Pt meets criteria for SEVERE MALNUTRITION in the context of chronic illness as evidenced by a 20% weight loss in 3 months and severe fat and muscle mass loss.  DOCUMENTATION CODES Per approved criteria  -Severe malnutrition in the context of chronic illness -Underweight   INTERVENTION: Provide Ensure Enlive po BID, each supplement provides 350 kcal and 20 grams of protein.  Provide nourishment snacks (sandwich). Ordered.  Encourage adequate PO intake.  NUTRITION DIAGNOSIS: Malnutrition related to inadequate oral intake as evidenced by severe fat and muscle mass loss.   Goal: Pt to meet >/= 90% of their estimated nutrition needs   Monitor:  PO intake, weight trends, labs, I/O's  Reason for Assessment: Low BMI  34 y.o. male  Admitting Dx: Sepsis  ASSESSMENT: Pt with hx of crohn's disease and bipolar disorder with polysubstance abuse here with severe cough for 1 week. He was found to be febrile meeting sepsis criteria. Hospitalist was called for admission for CAP and sepsis.  Pt reports his appetite is fine currently. Meal completion has been 100%. Pt reports PTA he had a decreased appetite for 1 week due to the CAP. He reports he has been drinking fluids at home, however has been eating only snacks or bites of food over the past week. Pt reports weight loss. Noted pt with a 20% weight loss in 3 months. Pt reports he usually drinks Ensure/Boost at home, however has not consumed them in a long time due to costs. Pt is agreeable to receiving Ensure. RD to order. Pt additionally requests nourishment snacks. RD will order.   Nutrition Focused Physical Exam:  Subcutaneous Fat:  Orbital Region: N/A Upper Arm Region: WNL Thoracic and Lumbar Region: Severe depletion  Muscle:  Temple Region: N/A Clavicle Bone Region: Severe depletion Clavicle and Acromion Bone Region: Severe depletion Scapular Bone Region: Severe depletion Dorsal Hand: N/A Patellar  Region: Severe depletion Anterior Thigh Region: Severe depletion Posterior Calf Region: Moderate to Severe depletion  Edema: none  Labs: Low sodium, calcium, and total bilirubin. High AST.  Height: Ht Readings from Last 1 Encounters:  10/05/14 5' 6"  (1.676 m)    Weight: Wt Readings from Last 1 Encounters:  10/05/14 92 lb 12.6 oz (42.087 kg)    Ideal Body Weight: 142 lbs  % Ideal Body Weight: 65%  Wt Readings from Last 10 Encounters:  10/05/14 92 lb 12.6 oz (42.087 kg)  07/15/14 115 lb (52.164 kg)  05/05/14 110 lb (49.896 kg)  05/02/12 105 lb (47.628 kg)  04/21/12 100 lb (45.36 kg)  02/10/12 115 lb (52.164 kg)  09/05/11 106 lb 6.4 oz (48.263 kg)  09/04/11 104 lb (47.174 kg)  07/22/11 120 lb (54.432 kg)    Usual Body Weight: 105-110 lbs (per pt report)  % Usual Body Weight: 88%  BMI:  Body mass index is 14.98 kg/(m^2). Underweight  Estimated Nutritional Needs: Kcal: 1700-2000 Protein: 70-85 grams Fluid: 1.7 - 2 L/day  Skin: Intact  Diet Order: Diet regular  EDUCATION NEEDS: -No education needs identified at this time   Intake/Output Summary (Last 24 hours) at 10/06/14 1128 Last data filed at 10/06/14 0844  Gross per 24 hour  Intake 598.33 ml  Output    375 ml  Net 223.33 ml    Last BM: 3/15  Labs:   Recent Labs Lab 10/05/14 1453 10/06/14 0545  NA 133* 134*  K 3.5 3.5  CL 96 108  CO2 27 20  BUN 9 7  CREATININE 1.01 0.83  CALCIUM 9.5 7.6*  GLUCOSE 83 87    CBG (last 3)  No results for input(s): GLUCAP in the last 72 hours.  Scheduled Meds: . enoxaparin (LOVENOX) injection  40 mg Subcutaneous Q24H  . [START ON 10/07/2014] Influenza vac split quadrivalent PF  0.5 mL Intramuscular Tomorrow-1000  . ipratropium-albuterol  3 mL Nebulization Q6H  . levofloxacin (LEVAQUIN) IV  750 mg Intravenous Q24H  . [START ON 10/07/2014] pneumococcal 23 valent vaccine  0.5 mL Intramuscular Tomorrow-1000    Continuous Infusions:   Past Medical History   Diagnosis Date  . Crohn disease Dx 2004    terminal ileum Crohn's s/p ileocolectomy with anastamosis  . Bipolar 1 disorder     Self laceration and self harm behaviors   . Substance abuse   . Depression   . Anxiety   . Asthma     mild   . Hypertension     Past Surgical History  Procedure Laterality Date  . Colon surgery  01/17/2006  . Inguinal hernia repair      On the right and the left   . Tonsillectomy      20 yrs ago  . Esophagogastroduodenoscopy  09/04/2011    Procedure: ESOPHAGOGASTRODUODENOSCOPY (EGD);  Surgeon: Estanislado Emms., MD,FACG;  Location: [facility];  Service: Endoscopy;  Laterality: N/A;    Kallie Locks, MS, RD, LDN Pager # [phone] After hours/ weekend pager # [phone]

## 2014-10-06 NOTE — Progress Notes (Signed)
PROGRESS NOTE  [name]. IRC:[id number] DOB: 11/06/1980 DOA: 10/05/2014 PCP: Default, Provider, MD  HPI: 34 yo M with hx of crohn's disease and bipolar disorder with polysubstance abuse here with severe cough for 1 week He was coughing so hard he was worried that he caused himself a hernia and presented to ER.   Subjective / 24 H Interval events - still short of breath but better this morning  Assessment/Plan: Active Problems:   Sepsis   CAP (community acquired pneumonia)   Asthma   Protein-calorie malnutrition, severe   Sepsis due to CAP and Influenza - continue Levofloxacin, influenza positive, started Tamiflu  Asthma  - no evidence of exacerbation, continue antibiotics, inhalers  Abdominal pain  - CT without acute findings, likely musculoskeletal due to cough  Chron's disease  - stable   Diet: Diet regular Fluids: none DVT Prophylaxis: Lovenox  Code Status: Full Code Family Communication: d/w mother at bedside  Disposition Plan: remain inpatient  Consultants:  None   Procedures:  None    Antibiotics Levofloxacin 3/14 >> Tamiflu 3/15 >>   Studies  Dg Chest 2 View (if Patient Has Fever And/or Copd)  10/05/2014   CLINICAL DATA:  Cough and fever for 1 week.  Smoker.  EXAM: CHEST  2 VIEW  COMPARISON:  PA and lateral chest 07/22/2011 and 05/05/2014.  FINDINGS: The chest is hyperexpanded with attenuation of the pulmonary vasculature. There is right middle lobe airspace disease most consistent with pneumonia. The left lung is clear. No pneumothorax or pleural effusion.  IMPRESSION: Right middle lobe airspace disease consistent with pneumonia.  Emphysema.   Electronically Signed   By: Inge Rise M.D.   On: 10/05/2014 14:29   Ct Abdomen Pelvis W Contrast  10/05/2014   CLINICAL DATA:  Acute onset of cough and abdominal pain. Initial encounter.  EXAM: CT ABDOMEN AND PELVIS WITH CONTRAST  TECHNIQUE: Multidetector CT imaging of the abdomen and  pelvis was performed using the standard protocol following bolus administration of intravenous contrast.  CONTRAST:  68m OMNIPAQUE IOHEXOL 300 MG/ML  SOLN  COMPARISON:  CT of the abdomen and pelvis performed 12/23/2012  FINDINGS: Dense focal right middle lobe pneumonia is noted. Additional patchy right lower lobe opacity may reflect atelectasis or additional pneumonia. Lucency within the lung fields raises concern for emphysematous change, unusual in a patient of this age.  The liver and spleen are unremarkable in appearance. The gallbladder is within normal limits. The pancreas and adrenal glands are unremarkable.  A 1.2 cm cyst is noted at the upper pole of the right kidney. There is no evidence of hydronephrosis. No renal or ureteral stones are seen. No perinephric stranding is appreciated.  No free fluid is identified. The small bowel is unremarkable in appearance. The stomach is within normal limits. No acute vascular abnormalities are seen.  The appendix is not definitely characterized; there is no evidence of appendicitis. There is no definite evidence for acute exacerbation of the patient's Crohn's disease. The colon is partially filled with contrast and air, and is unremarkable in appearance.  The bladder is mildly distended and grossly unremarkable in appearance. The prostate remains normal in size. No inguinal lymphadenopathy is seen.  No acute osseous abnormalities are identified.  IMPRESSION: 1. Dense focal right middle lobe pneumonia noted. 2. Lucency within the lung fields raises concern for emphysematous change, unusual in a patient of this age. Would correlate clinically for chronic symptoms. 3. No evidence of hernia. The patient has no evidence  of hernia on studies dating back to 2005. 4. Small right renal cyst noted. 5. No evidence of acute exacerbation of the patient's Crohn's disease.   Electronically Signed   By: Garald Balding M.D.   On: 10/05/2014 18:43    Objective  Filed Vitals:    10/06/14 0530 10/06/14 0843 10/06/14 0901 10/06/14 1434  BP: 91/64 94/62    Pulse: 87 85 77 66  Temp: 98.1 F (36.7 C) 98.7 F (37.1 C)    TempSrc: Oral Oral    Resp: 18 18 18 18   Height:      Weight:      SpO2: 92% 94% 91% 95%    Intake/Output Summary (Last 24 hours) at 10/06/14 1711 Last data filed at 10/06/14 0844  Gross per 24 hour  Intake 598.33 ml  Output    375 ml  Net 223.33 ml   Filed Weights   10/05/14 1245 10/05/14 2212  Weight: 41.986 kg (92 lb 9 oz) 42.087 kg (92 lb 12.6 oz)   Exam:  General:  NAD  HEENT: no scleral icterus, PERRL  Cardiovascular: RRR without MRG  Respiratory: no wheezing, rhonchi in right  Abdomen: soft, non tender  MSK/Extremities: no clubbing/cyanosis  Skin: no rashes  Neuro: non focal   Data Reviewed: Basic Metabolic Panel:  Recent Labs Lab 10/05/14 1453 10/06/14 0545  NA 133* 134*  K 3.5 3.5  CL 96 108  CO2 27 20  GLUCOSE 83 87  BUN 9 7  CREATININE 1.01 0.83  CALCIUM 9.5 7.6*   Liver Function Tests:  Recent Labs Lab 10/05/14 1453 10/06/14 0545  AST 81* 45*  ALT 87* 49  ALKPHOS 110 70  BILITOT 0.6 0.2*  PROT 7.8 4.8*  ALBUMIN 3.8 2.5*    Recent Labs Lab 10/05/14 1453  LIPASE 37   CBC:  Recent Labs Lab 10/05/14 1453 10/06/14 0545  WBC 11.4* 5.7  NEUTROABS 8.7* 3.2  HGB 16.8 12.2*  HCT 48.0 34.7*  MCV 96.8 97.2  PLT 154 112*    Recent Results (from the past 240 hour(s))  Clostridium Difficile by PCR     Status: None   Collection Time: 10/05/14 10:58 PM  Result Value Ref Range Status   C difficile by pcr NEGATIVE NEGATIVE Final     Scheduled Meds: . enoxaparin (LOVENOX) injection  40 mg Subcutaneous Q24H  . feeding supplement (ENSURE COMPLETE)  237 mL Oral BID BM  . [START ON 10/07/2014] Influenza vac split quadrivalent PF  0.5 mL Intramuscular Tomorrow-1000  . ipratropium-albuterol  3 mL Nebulization Q6H  . levofloxacin (LEVAQUIN) IV  750 mg Intravenous Q24H  . oseltamivir  75 mg  Oral BID  . [START ON 10/07/2014] pneumococcal 23 valent vaccine  0.5 mL Intramuscular Tomorrow-1000   Continuous Infusions:   Marzetta Board, MD [facility] Pager [phone]. If 7 PM - 7 AM, please contact night-coverage at [email or web address], password [facility] 10/06/2014, 5:11 PM  LOS: 1 day

## 2014-10-06 NOTE — Care Management Note (Signed)
CARE MANAGEMENT NOTE 10/06/2014  Patient:  [name], [name]   Account Number:  [id number]  Date Initiated:  10/06/2014  Documentation initiated by:  Brooklyne Radke  Subjective/Objective Assessment:   CM following for progression and d/c planning.     Action/Plan:   Following for d/c  eeds, will provide MATCH letter to assist with meds.   Anticipated DC Date:  10/08/2014   Anticipated DC Plan:  HOME/SELF CARE         Choice offered to / List presented to:             Status of service:   Medicare Important Message given?   (If response is "NO", the following Medicare IM given date fields will be blank) Date Medicare IM given:   Medicare IM given by:   Date Additional Medicare IM given:   Additional Medicare IM given by:    Discharge Disposition:    Per UR Regulation:    If discussed at Long Length of Stay Meetings, dates discussed:    Comments:

## 2014-10-07 DIAGNOSIS — J1100 Influenza due to unidentified influenza virus with unspecified type of pneumonia: Secondary | ICD-10-CM | POA: Diagnosis present

## 2014-10-07 DIAGNOSIS — Z720 Tobacco use: Secondary | ICD-10-CM

## 2014-10-07 DIAGNOSIS — E43 Unspecified severe protein-calorie malnutrition: Secondary | ICD-10-CM

## 2014-10-07 LAB — COMPREHENSIVE METABOLIC PANEL
ALT: 43 U/L (ref 0–53)
AST: 40 U/L — AB (ref 0–37)
Albumin: 2.5 g/dL — ABNORMAL LOW (ref 3.5–5.2)
Alkaline Phosphatase: 64 U/L (ref 39–117)
Anion gap: 5 (ref 5–15)
BUN: <5 mg/dL — ABNORMAL LOW (ref 6–23)
CO2: 24 mmol/L (ref 19–32)
CREATININE: 0.74 mg/dL (ref 0.50–1.35)
Calcium: 8.3 mg/dL — ABNORMAL LOW (ref 8.4–10.5)
Chloride: 108 mmol/L (ref 96–112)
GFR calc Af Amer: >90 mL/min (ref >90)
Glucose, Bld: 76 mg/dL (ref 70–99)
Potassium: 3.2 mmol/L — ABNORMAL LOW (ref 3.5–5.1)
SODIUM: 137 mmol/L (ref 135–145)
TOTAL PROTEIN: 5.1 g/dL — AB (ref 6.0–8.3)
Total Bilirubin: 0.3 mg/dL (ref 0.3–1.2)

## 2014-10-07 LAB — HIV ANTIBODY (ROUTINE TESTING W REFLEX): HIV Screen 4th Generation wRfx: NONREACTIVE

## 2014-10-07 LAB — CBC
HEMATOCRIT: 36.1 % — AB (ref 39.0–52.0)
Hemoglobin: 12.2 g/dL — ABNORMAL LOW (ref 13.0–17.0)
MCH: 33 pg (ref 26.0–34.0)
MCHC: 33.8 g/dL (ref 30.0–36.0)
MCV: 97.6 fL (ref 78.0–100.0)
PLATELETS: 121 10*3/uL — AB (ref 150–400)
RBC: 3.7 MIL/uL — AB (ref 4.22–5.81)
RDW: 12.5 % (ref 11.5–15.5)
WBC: 4 10*3/uL (ref 4.0–10.5)

## 2014-10-07 LAB — LEGIONELLA ANTIGEN, URINE

## 2014-10-07 MED ORDER — HYDROCODONE-ACETAMINOPHEN 5-325 MG PO TABS: 1.0000 | ORAL_TABLET | ORAL | Status: DC | PRN

## 2014-10-07 MED ORDER — LEVOFLOXACIN 750 MG PO TABS: 750.0000 mg | ORAL_TABLET | Freq: Every day | ORAL | Status: DC

## 2014-10-07 MED ORDER — LEVOFLOXACIN 750 MG PO TABS
750.0000 mg | ORAL_TABLET | ORAL | Status: DC
  Administered 2014-10-07: 750 mg via ORAL
  Filled 2014-10-07: qty 1

## 2014-10-07 MED ORDER — CYANOCOBALAMIN 1000 MCG/ML IJ SOLN
1000.0000 ug | Freq: Once | INTRAMUSCULAR | Status: AC
  Administered 2014-10-07: 1000 ug via INTRAMUSCULAR
  Filled 2014-10-07: qty 1

## 2014-10-07 MED ORDER — ALBUTEROL SULFATE HFA 108 (90 BASE) MCG/ACT IN AERS: 2.0000 | INHALATION_SPRAY | Freq: Four times a day (QID) | RESPIRATORY_TRACT | Status: DC | PRN

## 2014-10-07 MED ORDER — OSELTAMIVIR PHOSPHATE 75 MG PO CAPS: 75.0000 mg | ORAL_CAPSULE | Freq: Two times a day (BID) | ORAL | Status: DC

## 2014-10-07 NOTE — Discharge Summary (Signed)
Physician Discharge Summary  [name]. VQM:[id number] DOB: [birth year] DOA: 10/05/2014  Admit date: 10/05/2014 Discharge date: 10/07/2014  Time spent: 35 minutes  Recommendations for Outpatient Follow-up:  1. Follow up with PCP in 2 weeks.  Discharge Diagnoses:  Active Problems:   Sepsis   CAP (community acquired pneumonia)   Asthma   Protein-calorie malnutrition, severe   Influenza with pneumonia  Discharge Condition: stable  Diet recommendation: regular  Filed Weights   10/05/14 1245 10/05/14 2212 10/06/14 2124  Weight: 41.986 kg (92 lb 9 oz) 42.087 kg (92 lb 12.6 oz) 42.78 kg (94 lb 5 oz)   History of present illness:  [name]. is a 34 y.o. Male has a past medical history of Crohn disease (Dx 2004); Bipolar 1 disorder; Substance abuse; Depression; Anxiety; Asthma; and Hypertension. 34 yo M with hx of crohn's disease and bipolar disorder with polysubstance abuse here with severe cough for 1 week He was coughing so hard he was worried that he caused himself a hernia and presented to ER. Patient reports some rib pain. He was found to be febrile meeting sepsis criteria. With CXR worrisome for RML PNA Ct of the abdomen showed no evidence of hernia. Patient reports he started to have nausea and vomiting after severe bout of coughing, reports chronically soft stools no significant diarrhea.  Lactic acid 1.38 Given Levaquin in ER Hospitalist was called for admission for CAP and sepsis  Hospital Course:   Sepsis due to CAP and Influenza - Patient was admitted to the hospital with CAP and sepsis and started on Levaquin. His influenza screen came back positive for Influenza A/H1N1 and was started on Tamiflu on 3.15. He improved on antibiotics and was able to ambulate in the hallway without dyspnea/chest pain or supplemental oxygen needs. He was discharged home in stable condition and will continue Tamiflu and Levaquin for 4 additional days to complete a 5 day  course. Case manager was consulted to assist patient with his medications as well as establishing a PCP in the area.  Asthma - no evidence of exacerbation, continue antibiotics, inhalers Abdominal pain - CT without acute findings, likely musculoskeletal due to cough Chron's disease - stable Tobacco/ETOH use - He was strongly advised for tobacco and alcohol cessation.  Thrombocytopenia - mild, in the setting of #1, improving   Procedures:  None    Consultations:  None   Discharge Exam: Filed Vitals:   10/06/14 2124 10/07/14 0117 10/07/14 0553 10/07/14 0708  BP: 100/67  116/79   Pulse: 81  70 84  Temp: 98.7 F (37.1 C)  97.9 F (36.6 C)   TempSrc: Oral  Oral   Resp: 18  17 17   Height: 5' 6"  (1.676 m)     Weight: 42.78 kg (94 lb 5 oz)     SpO2: 95% 96% 97% 93%   General: NAD Cardiovascular: RRR Respiratory: CTA biL  Discharge Instructions    Medication List    STOP taking these medications        azithromycin 250 MG tablet  Commonly known as:  ZITHROMAX     clindamycin 150 MG capsule  Commonly known as:  CLEOCIN      TAKE these medications        albuterol 108 (90 BASE) MCG/ACT inhaler  Commonly known as:  PROVENTIL HFA;VENTOLIN HFA  Inhale 2 puffs into the lungs every 6 (six) hours as needed for wheezing or shortness of breath.     HYDROcodone-acetaminophen 5-325 MG  per tablet  Commonly known as:  NORCO/VICODIN  Take 1 tablet by mouth every 4 (four) hours as needed.     levofloxacin 750 MG tablet  Commonly known as:  LEVAQUIN  Take 1 tablet (750 mg total) by mouth daily.     oseltamivir 75 MG capsule  Commonly known as:  TAMIFLU  Take 1 capsule (75 mg total) by mouth 2 (two) times daily.           Follow-up Information    Follow up with PCP  In 2 weeks.      Follow up with [facility]    .   Why:  Appointment , Wednesday, October 08, 2014 at 9:45 am.    Contact information:   [address] [place]  [place] [address] [phone]      The results of significant diagnostics from this hospitalization (including imaging, microbiology, ancillary and laboratory) are listed below for reference.    Significant Diagnostic Studies: Dg Chest 2 View (if Patient Has Fever And/or Copd)  10/05/2014   CLINICAL DATA:  Cough and fever for 1 week.  Smoker.  EXAM: CHEST  2 VIEW  COMPARISON:  PA and lateral chest 07/22/2011 and 05/05/2014.  FINDINGS: The chest is hyperexpanded with attenuation of the pulmonary vasculature. There is right middle lobe airspace disease most consistent with pneumonia. The left lung is clear. No pneumothorax or pleural effusion.  IMPRESSION: Right middle lobe airspace disease consistent with pneumonia.  Emphysema.   Electronically Signed   By: [name] M.D.   On: 10/05/2014 14:29   Ct Abdomen Pelvis W Contrast  10/05/2014   CLINICAL DATA:  Acute onset of cough and abdominal pain. Initial encounter.  EXAM: CT ABDOMEN AND PELVIS WITH CONTRAST  TECHNIQUE: Multidetector CT imaging of the abdomen and pelvis was performed using the standard protocol following bolus administration of intravenous contrast.  CONTRAST:  43m OMNIPAQUE IOHEXOL 300 MG/ML  SOLN  COMPARISON:  CT of the abdomen and pelvis performed 12/23/2012  FINDINGS: Dense focal right middle lobe pneumonia is noted. Additional patchy right lower lobe opacity may reflect atelectasis or additional pneumonia. Lucency within the lung fields raises concern for emphysematous change, unusual in a patient of this age.  The liver and spleen are unremarkable in appearance. The gallbladder is within normal limits. The pancreas and adrenal glands are unremarkable.  A 1.2 cm cyst is noted at the upper pole of the right kidney. There is no evidence of hydronephrosis. No renal or ureteral stones are seen. No perinephric stranding is appreciated.  No free fluid is identified. The small bowel is unremarkable in appearance. The stomach is  within normal limits. No acute vascular abnormalities are seen.  The appendix is not definitely characterized; there is no evidence of appendicitis. There is no definite evidence for acute exacerbation of the patient's Crohn's disease. The colon is partially filled with contrast and air, and is unremarkable in appearance.  The bladder is mildly distended and grossly unremarkable in appearance. The prostate remains normal in size. No inguinal lymphadenopathy is seen.  No acute osseous abnormalities are identified.  IMPRESSION: 1. Dense focal right middle lobe pneumonia noted. 2. Lucency within the lung fields raises concern for emphysematous change, unusual in a patient of this age. Would correlate clinically for chronic symptoms. 3. No evidence of hernia. The patient has no evidence of hernia on studies dating back to 2005. 4. Small right renal cyst noted. 5. No evidence of acute  exacerbation of the patient's Crohn's disease.   Electronically Signed   By: [name] M.D.   On: 10/05/2014 18:43    Microbiology: Recent Results (from the past 240 hour(s))  Blood culture (routine x 2)     Status: None (Preliminary result)   Collection Time: 10/05/14  1:00 PM  Result Value Ref Range Status   Specimen Description BLOOD ARM RIGHT  Final   Special Requests BOTTLES DRAWN AEROBIC AND ANAEROBIC 5CC  Final   Culture   Final           BLOOD CULTURE RECEIVED NO GROWTH TO DATE CULTURE WILL BE HELD FOR 5 DAYS BEFORE ISSUING A FINAL NEGATIVE REPORT Performed at [facility]    Report Status PENDING  Incomplete  Blood culture (routine x 2)     Status: None (Preliminary result)   Collection Time: 10/05/14  2:55 PM  Result Value Ref Range Status   Specimen Description BLOOD ARM LEFT  Final   Special Requests BOTTLES DRAWN AEROBIC AND ANAEROBIC 5CC  Final   Culture   Final           BLOOD CULTURE RECEIVED NO GROWTH TO DATE CULTURE WILL BE HELD FOR 5 DAYS BEFORE ISSUING A FINAL NEGATIVE REPORT Performed  at [facility]    Report Status PENDING  Incomplete  Clostridium Difficile by PCR     Status: None   Collection Time: 10/05/14 10:58 PM  Result Value Ref Range Status   C difficile by pcr NEGATIVE NEGATIVE Final     Labs: Basic Metabolic Panel:  Recent Labs Lab 10/05/14 1453 10/06/14 0545 10/07/14 0615  NA 133* 134* 137  K 3.5 3.5 3.2*  CL 96 108 108  CO2 27 20 24   GLUCOSE 83 87 76  BUN 9 7 <5*  CREATININE 1.01 0.83 0.74  CALCIUM 9.5 7.6* 8.3*   Liver Function Tests:  Recent Labs Lab 10/05/14 1453 10/06/14 0545 10/07/14 0615  AST 81* 45* 40*  ALT 87* 49 43  ALKPHOS 110 70 64  BILITOT 0.6 0.2* 0.3  PROT 7.8 4.8* 5.1*  ALBUMIN 3.8 2.5* 2.5*    Recent Labs Lab 10/05/14 1453  LIPASE 37   CBC:  Recent Labs Lab 10/05/14 1453 10/06/14 0545 10/07/14 0615  WBC 11.4* 5.7 4.0  NEUTROABS 8.7* 3.2  --   HGB 16.8 12.2* 12.2*  HCT 48.0 34.7* 36.1*  MCV 96.8 97.2 97.6  PLT 154 112* 121*     Signed:  GHERGHE, COSTIN  [facility] 10/07/2014, 2:42 PM

## 2014-10-07 NOTE — Care Management Note (Signed)
CARE MANAGEMENT NOTE 10/07/2014  Patient:  [name], [name]   Account Number:  [id number]  Date Initiated:  10/06/2014  Documentation initiated by:  Dereke Neumann  Subjective/Objective Assessment:   CM following for progression and d/c planning.     Action/Plan:   Following for d/c  eeds, will provide MATCH letter to assist with meds.  10/07/14 [place] letter given and followup appointment at [facility] and [facility] arranged.   Anticipated DC Date:  10/07/2014   Anticipated DC Plan:  [facility]      Choice offered to / List presented to:             Status of service:  Completed, signed off Medicare Important Message given?   (If response is "NO", the following Medicare IM given date fields will be blank) Date Medicare IM given:   Medicare IM given by:   Date Additional Medicare IM given:   Additional Medicare IM given by:    Discharge Disposition:  HOME/SELF CARE  Per UR Regulation:    If discussed at Long Length of Stay Meetings, dates discussed:    Comments:  10/07/2014 Met with pt and mother re d/c needs, pt given [place] letter and explained to pt and mother.Appointment arranged at [facility] for [place], October 08, 2014 @ 9:45am. Pt informed and info on [place] given to pt.  CRoyal RN MPH, Case manager, [phone]

## 2014-10-07 NOTE — Progress Notes (Signed)
°   10/07/14 0950  Clinical Encounter Type  Visited With Patient  Visit Type Initial  Spiritual Encounters  Spiritual Needs Emotional   Visited with patient for a brief moment. Introduced myself, and pt asked asked what the Chaplains department does. I answered him. Pt stated that he had no concerns and that he was fine, and didn't need anything or anyone to talk to.  Drue Dun, Chaplain Intern 9:59 AM, 10/07/2014

## 2014-10-07 NOTE — Progress Notes (Signed)
Patient Discharge:  Disposition: Pt discharged home with mother  Education: Pt educated on medications, follow up appointment, and all discharge instructions. Pt given handouts on diagnosis.Pt verbalized understanding  IV: Removed  Telemetry: Removed CCMD notified.  Follow-up appointments: Follow up made at [facility] 10/08/14 9:45am  Prescriptions: Script given to pt  Transportation: Transported home via bus. Bus pass given to pt  Belongings:All belongings taken with pt

## 2014-10-08 ENCOUNTER — Ambulatory Visit: Payer: Self-pay | Attending: Family Medicine | Admitting: Family Medicine

## 2014-10-08 DIAGNOSIS — J189 Pneumonia, unspecified organism: Secondary | ICD-10-CM | POA: Insufficient documentation

## 2014-10-08 DIAGNOSIS — J111 Influenza due to unidentified influenza virus with other respiratory manifestations: Secondary | ICD-10-CM | POA: Insufficient documentation

## 2014-10-08 MED ORDER — ALBUTEROL SULFATE HFA 108 (90 BASE) MCG/ACT IN AERS
2.0000 | INHALATION_SPRAY | Freq: Four times a day (QID) | RESPIRATORY_TRACT | Status: DC | PRN
Start: 2014-10-08 — End: 2016-07-05

## 2014-10-08 MED ORDER — PROMETHAZINE HCL 25 MG PO TABS: 25.0000 mg | ORAL_TABLET | Freq: Three times a day (TID) | ORAL | Status: DC | PRN

## 2014-10-08 NOTE — Progress Notes (Signed)
History of present illness:  [name]. is a 34 y.o. Male has a past medical history of Crohn disease (Dx 2004); Bipolar 1 disorder; Substance abuse; Depression; Anxiety; Asthma; and Hypertension. 34 yo M with hx of crohn's disease and bipolar disorder with polysubstance abuse here with severe cough for 1 week He was coughing so hard he was worried that he caused himself a hernia and presented to ER. Patient reports some rib pain. He was found to be febrile meeting sepsis criteria. With CXR worrisome for RML PNA Ct of the abdomen showed no evidence of hernia. Patient reports he started to have nausea and vomiting after severe bout of coughing, reports chronically soft stools no significant diarrhea.  Lactic acid 1.38 Given Levaquin in ER Hospitalist was called for admission for CAP and sepsis  Hospital Course:   Sepsis due to CAP and Influenza - Patient was admitted to the hospital with CAP and sepsis and started on Levaquin. His influenza screen came back positive for Influenza A/H1N1 and was started on Tamiflu on 3.15. He improved on antibiotics and was able to ambulate in the hallway without dyspnea/chest pain or supplemental oxygen needs. He was discharged home in stable condition and will continue Tamiflu and Levaquin for 4 additional days to complete a 5 day course. Case manager was consulted to assist patient with his medications as well as establishing a PCP in the area.  Asthma - no evidence of exacerbation, continue antibiotics, inhalers Abdominal pain - CT without acute findings, likely musculoskeletal due to cough Chron's disease - stable Tobacco/ETOH use - He was strongly advised for tobacco and alcohol cessation.  Thrombocytopenia - mild, in the setting of #1, improving

## 2014-10-08 NOTE — Progress Notes (Signed)
Patient ID: [name]., male   DOB: 05/13/1981, 34 y.o.   MRN: [id number]  EEFEOF Complaint:  Follow-up hospital visit  Subjective:  Presents for hospital follow-up influenza and CAP.  He was hospitalized from March 14-17.  He was discharged with prescription for promethazine, Tamiflu  and Levaquin.  He reports feeling better but not back to normal. His promblem list includes regional enteritis and intestinal obstruction, anemia, cocaine abuse, malnutrition and Bipolar disorder. The main reason for this visit today is to establish care and seek financial aid.    ROS:  GEN:   Denies fever, chills Admits to chills and,WT loss Skin:   Denies lesions or rashes HENT:   Denies  earache, epistaxis, sore throat, or neck pain,    Admits to mild headache                LUNGS:  SMI SOB with walking CV:   Denies CP or palpitations ABD:   Denies abdominal pain, nausea,and vomiting. Admits to nausea and vomiting and abd pain            EXT:    Denies muscle spasms or swelling; no pain in lower ext, no weakness NEURO:   Denies numbness or tingling, denies sz, Hx of stroke.  Objective:  Filed Vitals:   10/08/14 0957  BP: 113/81  Pulse: 88  Temp: 98 F (36.7 C)  Resp: 16  Height: 5' 6"  (1.676 m)  Weight: 93 lb (42.185 kg)  SpO2: 96%    Physical Exam: General:  in no acute distress. HEENT:  no pallor, no icterus, moist oral mucosa, no  lymphadenopathy Heart:   Normal  s1 &s2  Regular rate and rhythm, without M,G,R Lungs:   Decreased BS on left, crackles on right. No increased work of breathing Abdomen:  Soft, nontender, nondistended, positive bowel sounds. Exetremeties:  No pedal edema,pedal pulses normal. Neuro:   Alert, awake, oriented x3, nonfoc   Medications: Prior to Admission medications   Medication Sig Start Date End Date Taking? Authorizing Provider  albuterol (PROVENTIL HFA;VENTOLIN HFA) 108 (90 BASE) MCG/ACT inhaler Inhale 2 puffs into the lungs every 6 (six)  hours as needed for wheezing or shortness of breath. 10/07/14  Yes Costin Karlyne Greenspan, MD  HYDROcodone-acetaminophen (NORCO/VICODIN) 5-325 MG per tablet Take 1 tablet by mouth every 4 (four) hours as needed. 10/07/14  Yes Costin Karlyne Greenspan, MD  levofloxacin (LEVAQUIN) 750 MG tablet Take 1 tablet (750 mg total) by mouth daily. 10/07/14  Yes Costin Karlyne Greenspan, MD  oseltamivir (TAMIFLU) 75 MG capsule Take 1 capsule (75 mg total) by mouth 2 (two) times daily. 10/07/14  Yes Costin Karlyne Greenspan, MD    Assessment: 1. Influenza 2. CAP 3.   Plan: Continue with instructions from hospital. Reorder albuterol inhaler here so he can get free. Follow-up here. In 2 weeks.  Follow up:  The patient was given clear instructions to go to ER or return to medical center if symptoms don't improve, worsen or new problems develop. The patient verbalized understanding. The patient was told to call to get lab results if they haven't heard anything in the next week.   This note has been created with Surveyor, quantity. Any transcriptional errors are unintentional.   Micheline Chapman, FNP,BC 10/08/2014, 10:44 AM

## 2014-10-08 NOTE — Patient Instructions (Signed)
1. Follow instructions from hospital. 2. Pick up meds at pharmacy 3. Make appointment with financial assistance 4. Follow-up with primary doctor here in 2 weeks. 5. Follw-up here or ED for worsening symptoms

## 2014-10-12 LAB — CULTURE, BLOOD (ROUTINE X 2)
Culture: NO GROWTH
Culture: NO GROWTH

## 2014-10-23 ENCOUNTER — Ambulatory Visit: Payer: Self-pay | Admitting: Family Medicine

## 2014-12-12 ENCOUNTER — Encounter (HOSPITAL_COMMUNITY): Payer: Self-pay | Admitting: *Deleted

## 2014-12-12 ENCOUNTER — Emergency Department (HOSPITAL_COMMUNITY)
Admission: EM | Admit: 2014-12-12 | Discharge: 2014-12-12 | Disposition: A | Discharge status: Home / Self Care | Payer: Self-pay | Attending: Emergency Medicine | Admitting: Emergency Medicine

## 2014-12-12 ENCOUNTER — Emergency Department (HOSPITAL_COMMUNITY): Payer: Self-pay

## 2014-12-12 DIAGNOSIS — J441 Chronic obstructive pulmonary disease with (acute) exacerbation: Secondary | ICD-10-CM | POA: Insufficient documentation

## 2014-12-12 DIAGNOSIS — Z7982 Long term (current) use of aspirin: Secondary | ICD-10-CM | POA: Insufficient documentation

## 2014-12-12 DIAGNOSIS — Z8659 Personal history of other mental and behavioral disorders: Secondary | ICD-10-CM | POA: Insufficient documentation

## 2014-12-12 DIAGNOSIS — J45901 Unspecified asthma with (acute) exacerbation: Secondary | ICD-10-CM

## 2014-12-12 DIAGNOSIS — I10 Essential (primary) hypertension: Secondary | ICD-10-CM | POA: Insufficient documentation

## 2014-12-12 DIAGNOSIS — Z8719 Personal history of other diseases of the digestive system: Secondary | ICD-10-CM | POA: Insufficient documentation

## 2014-12-12 DIAGNOSIS — Z79899 Other long term (current) drug therapy: Secondary | ICD-10-CM | POA: Insufficient documentation

## 2014-12-12 DIAGNOSIS — Z720 Tobacco use: Secondary | ICD-10-CM | POA: Insufficient documentation

## 2014-12-12 MED ORDER — IBUPROFEN 400 MG PO TABS
400.0000 mg | ORAL_TABLET | Freq: Once | ORAL | Status: AC
  Administered 2014-12-12: 400 mg via ORAL
  Filled 2014-12-12: qty 1

## 2014-12-12 MED ORDER — PREDNISONE 10 MG PO TABS: 40.0000 mg | ORAL_TABLET | Freq: Every day | ORAL | Status: DC

## 2014-12-12 MED ORDER — HYDROCODONE-ACETAMINOPHEN 5-325 MG PO TABS: 1.0000 | ORAL_TABLET | Freq: Four times a day (QID) | ORAL | Status: DC | PRN

## 2014-12-12 MED ORDER — AZITHROMYCIN 250 MG PO TABS: 250.0000 mg | ORAL_TABLET | Freq: Every day | ORAL | Status: DC

## 2014-12-12 MED ORDER — PREDNISONE 20 MG PO TABS
40.0000 mg | ORAL_TABLET | Freq: Once | ORAL | Status: AC
  Administered 2014-12-12: 40 mg via ORAL
  Filled 2014-12-12: qty 2

## 2014-12-12 MED ORDER — IPRATROPIUM-ALBUTEROL 0.5-2.5 (3) MG/3ML IN SOLN
3.0000 mL | Freq: Once | RESPIRATORY_TRACT | Status: AC
  Administered 2014-12-12: 3 mL via RESPIRATORY_TRACT
  Filled 2014-12-12: qty 3

## 2014-12-12 MED ORDER — ALBUTEROL SULFATE HFA 108 (90 BASE) MCG/ACT IN AERS
2.0000 | INHALATION_SPRAY | Freq: Once | RESPIRATORY_TRACT | Status: AC
  Administered 2014-12-12: 2 via RESPIRATORY_TRACT
  Filled 2014-12-12: qty 6.7

## 2014-12-12 NOTE — Discharge Instructions (Signed)
Chronic Obstructive Pulmonary Disease Chronic obstructive pulmonary disease (COPD) is a common lung condition in which airflow from the lungs is limited. COPD is a general term that can be used to describe many different lung problems that limit airflow, including both chronic bronchitis and emphysema. If you have COPD, your lung function will probably never return to normal, but there are measures you can take to improve lung function and make yourself feel better.  CAUSES   Smoking (common).   Exposure to secondhand smoke.   Genetic problems.  Chronic inflammatory lung diseases or recurrent infections. SYMPTOMS   Shortness of breath, especially with physical activity.   Deep, persistent (chronic) cough with a large amount of thick mucus.   Wheezing.   Rapid breaths (tachypnea).   Gray or bluish discoloration (cyanosis) of the skin, especially in fingers, toes, or lips.   Fatigue.   Weight loss.   Frequent infections or episodes when breathing symptoms become much worse (exacerbations).   Chest tightness. DIAGNOSIS  Your health care provider will take a medical history and perform a physical examination to make the initial diagnosis. Additional tests for COPD may include:   Lung (pulmonary) function tests.  Chest X-ray.  CT scan.  Blood tests. TREATMENT  Treatment available to help you feel better when you have COPD includes:   Inhaler and nebulizer medicines. These help manage the symptoms of COPD and make your breathing more comfortable.  Supplemental oxygen. Supplemental oxygen is only helpful if you have a low oxygen level in your blood.   Exercise and physical activity. These are beneficial for nearly all people with COPD. Some people may also benefit from a pulmonary rehabilitation program. HOME CARE INSTRUCTIONS   Take all medicines (inhaled or pills) as directed by your health care provider.  Avoid over-the-counter medicines or cough syrups  that dry up your airway (such as antihistamines) and slow down the elimination of secretions unless instructed otherwise by your health care provider.   If you are a smoker, the most important thing that you can do is stop smoking. Continuing to smoke will cause further lung damage and breathing trouble. Ask your health care provider for help with quitting smoking. He or she can direct you to community resources or hospitals that provide support.  Avoid exposure to irritants such as smoke, chemicals, and fumes that aggravate your breathing.  Use oxygen therapy and pulmonary rehabilitation if directed by your health care provider. If you require home oxygen therapy, ask your health care provider whether you should purchase a pulse oximeter to measure your oxygen level at home.   Avoid contact with individuals who have a contagious illness.  Avoid extreme temperature and humidity changes.  Eat healthy foods. Eating smaller, more frequent meals and resting before meals may help you maintain your strength.  Stay active, but balance activity with periods of rest. Exercise and physical activity will help you maintain your ability to do things you want to do.  Preventing infection and hospitalization is very important when you have COPD. Make sure to receive all the vaccines your health care provider recommends, especially the pneumococcal and influenza vaccines. Ask your health care provider whether you need a pneumonia vaccine.  Learn and use relaxation techniques to manage stress.  Learn and use controlled breathing techniques as directed by your health care provider. Controlled breathing techniques include:   Pursed lip breathing. Start by breathing in (inhaling) through your nose for 1 second. Then, purse your lips as if you were  going to whistle and breathe out (exhale) through the pursed lips for 2 seconds.   Diaphragmatic breathing. Start by putting one hand on your abdomen just above  your waist. Inhale slowly through your nose. The hand on your abdomen should move out. Then purse your lips and exhale slowly. You should be able to feel the hand on your abdomen moving in as you exhale.   Learn and use controlled coughing to clear mucus from your lungs. Controlled coughing is a series of short, progressive coughs. The steps of controlled coughing are:   Lean your head slightly forward.   Breathe in deeply using diaphragmatic breathing.   Try to hold your breath for 3 seconds.   Keep your mouth slightly open while coughing twice.   Spit any mucus out into a tissue.   Rest and repeat the steps once or twice as needed. SEEK MEDICAL CARE IF:   You are coughing up more mucus than usual.   There is a change in the color or thickness of your mucus.   Your breathing is more labored than usual.   Your breathing is faster than usual.  SEEK IMMEDIATE MEDICAL CARE IF:   You have shortness of breath while you are resting.   You have shortness of breath that prevents you from:  Being able to talk.   Performing your usual physical activities.   You have chest pain lasting longer than 5 minutes.   Your skin color is more cyanotic than usual.  You measure low oxygen saturations for longer than 5 minutes with a pulse oximeter. MAKE SURE YOU:   Understand these instructions.  Will watch your condition.  Will get help right away if you are not doing well or get worse. Document Released: 04/19/2005 Document Revised: 11/24/2013 Document Reviewed: 03/06/2013 [facility] Patient Information 2015 [place], [place]. This information is not intended to replace advice given to you by your health care provider. Make sure you discuss any questions you have with your health care provider. Smoking Cessation Quitting smoking is important to your health and has many advantages. However, it is not always easy to quit since nicotine is a very addictive drug. Oftentimes,  people try 3 times or more before being able to quit. This document explains the best ways for you to prepare to quit smoking. Quitting takes hard work and a lot of effort, but you can do it. ADVANTAGES OF QUITTING SMOKING  You will live longer, feel better, and live better.  Your body will feel the impact of quitting smoking almost immediately.  Within 20 minutes, blood pressure decreases. Your pulse returns to its normal level.  After 8 hours, carbon monoxide levels in the blood return to normal. Your oxygen level increases.  After 24 hours, the chance of having a heart attack starts to decrease. Your breath, hair, and body stop smelling like smoke.  After 48 hours, damaged nerve endings begin to recover. Your sense of taste and smell improve.  After 72 hours, the body is virtually free of nicotine. Your bronchial tubes relax and breathing becomes easier.  After 2 to 12 weeks, lungs can hold more air. Exercise becomes easier and circulation improves.  The risk of having a heart attack, stroke, cancer, or lung disease is greatly reduced.  After 1 year, the risk of coronary heart disease is cut in half.  After 5 years, the risk of stroke falls to the same as a nonsmoker.  After 10 years, the risk of lung cancer is  cut in half and the risk of other cancers decreases significantly.  After 15 years, the risk of coronary heart disease drops, usually to the level of a nonsmoker.  If you are pregnant, quitting smoking will improve your chances of having a healthy baby.  The people you live with, especially any children, will be healthier.  You will have extra money to spend on things other than cigarettes. QUESTIONS TO THINK ABOUT BEFORE ATTEMPTING TO QUIT You may want to talk about your answers with your health care provider.  Why do you want to quit?  If you tried to quit in the past, what helped and what did not?  What will be the most difficult situations for you after you  quit? How will you plan to handle them?  Who can help you through the tough times? Your family? Friends? A health care provider?  What pleasures do you get from smoking? What ways can you still get pleasure if you quit? Here are some questions to ask your health care provider:  How can you help me to be successful at quitting?  What medicine do you think would be best for me and how should I take it?  What should I do if I need more help?  What is smoking withdrawal like? How can I get information on withdrawal? GET READY  Set a quit date.  Change your environment by getting rid of all cigarettes, ashtrays, matches, and lighters in your home, car, or work. Do not let people smoke in your home.  Review your past attempts to quit. Think about what worked and what did not. GET SUPPORT AND ENCOURAGEMENT You have a better chance of being successful if you have help. You can get support in many ways.  Tell your family, friends, and coworkers that you are going to quit and need their support. Ask them not to smoke around you.  Get individual, group, or telephone counseling and support. Programs are available at [employer] and health centers. Call your local health department for information about programs in your area.  Spiritual beliefs and practices may help some smokers quit.  Download a "quit meter" on your computer to keep track of quit statistics, such as how long you have gone without smoking, cigarettes not smoked, and money saved.  Get a self-help book about quitting smoking and staying off tobacco. [place] yourself from urges to smoke. Talk to someone, go for a walk, or occupy your time with a task.  Change your normal routine. Take a different route to work. Drink tea instead of coffee. Eat breakfast in a different place.  Reduce your stress. Take a hot bath, exercise, or read a book.  Plan something enjoyable to do every day. Reward  yourself for not smoking.  Explore interactive web-based programs that specialize in helping you quit. GET MEDICINE AND USE IT CORRECTLY Medicines can help you stop smoking and decrease the urge to smoke. Combining medicine with the above behavioral methods and support can greatly increase your chances of successfully quitting smoking.  Nicotine replacement therapy helps deliver nicotine to your body without the negative effects and risks of smoking. Nicotine replacement therapy includes nicotine gum, lozenges, inhalers, nasal sprays, and skin patches. Some may be available over-the-counter and others require a prescription.  Antidepressant medicine helps people abstain from smoking, but how this works is unknown. This medicine is available by prescription.  Nicotinic receptor partial agonist medicine simulates the effect of  nicotine in your brain. This medicine is available by prescription. Ask your health care provider for advice about which medicines to use and how to use them based on your health history. Your health care provider will tell you what side effects to look out for if you choose to be on a medicine or therapy. Carefully read the information on the package. Do not use any other product containing nicotine while using a nicotine replacement product.  RELAPSE OR DIFFICULT SITUATIONS Most relapses occur within the first 3 months after quitting. Do not be discouraged if you start smoking again. Remember, most people try several times before finally quitting. You may have symptoms of withdrawal because your body is used to nicotine. You may crave cigarettes, be irritable, feel very hungry, cough often, get headaches, or have difficulty concentrating. The withdrawal symptoms are only temporary. They are strongest when you first quit, but they will go away within 10-14 days. To reduce the chances of relapse, try to:  Avoid drinking alcohol. Drinking lowers your chances of successfully  quitting.  Reduce the amount of caffeine you consume. Once you quit smoking, the amount of caffeine in your body increases and can give you symptoms, such as a rapid heartbeat, sweating, and anxiety.  Avoid smokers because they can make you want to smoke.  Do not let weight gain distract you. Many smokers will gain weight when they quit, usually less than 10 pounds. Eat a healthy diet and stay active. You can always lose the weight gained after you quit.  Find ways to improve your mood other than smoking. FOR MORE INFORMATION  www.smokefree.gov  Document Released: 07/04/2001 Document Revised: 11/24/2013 Document Reviewed: 10/19/2011 [facility] Patient Information 2015 [place], [place]. This information is not intended to replace advice given to you by your health care provider. Make sure you discuss any questions you have with your health care provider.

## 2014-12-12 NOTE — ED Provider Notes (Signed)
CSN: [id number]     Arrival date & time 12/12/14  1423 History   First MD Initiated Contact with Patient 12/12/14 1610     Chief Complaint  Patient presents with  . Cough     Patient is a 34 y.o. male presenting with cough. The history is provided by the patient. No language interpreter was used.  Cough  Mr. Severin presents for evaluation of shortness of breath. He reports 3 days of cough productive of yellow sputum, chest tightness and soreness with coughing, shortness of breath. She had similar symptoms a month and a half ago and was admitted with pneumonia. He denies any fevers, leg swelling or pain, vomiting, diarrhea, change in urination. He has a history of asthma and continues to smoke. He is out of his albuterol inhaler at home. Symptoms are moderate, constant, worsening.  Past Medical History  Diagnosis Date  . Crohn disease Dx 2004    terminal ileum Crohn's s/p ileocolectomy with anastamosis  . Bipolar 1 disorder     Self laceration and self harm behaviors   . Substance abuse   . Depression   . Anxiety   . Asthma     mild   . Hypertension    Past Surgical History  Procedure Laterality Date  . Colon surgery  01/17/2006  . Inguinal hernia repair      On the right and the left   . Tonsillectomy      20 yrs ago  . Esophagogastroduodenoscopy  09/04/2011    Procedure: ESOPHAGOGASTRODUODENOSCOPY (EGD);  Surgeon: Estanislado Emms., MD,FACG;  Location: [facility];  Service: Endoscopy;  Laterality: N/A;   Family History  Problem Relation Age of Onset  . Hypertension Other   . Diabetes Other    History  Substance Use Topics  . Smoking status: Current Every Day Smoker -- 0.50 packs/day    Types: Cigarettes  . Smokeless tobacco: Never Used  . Alcohol Use: Yes     Comment: occsionally    Review of Systems  Respiratory: Positive for cough.   All other systems reviewed and are negative.     Allergies  Penicillins and Diphenhydramine hcl  Home Medications    Prior to Admission medications   Medication Sig Start Date End Date Taking? Authorizing Provider  Aspirin-Caffeine 845-65 MG PACK Take 1 Package by mouth daily as needed (pain).   Yes Historical Provider, MD  albuterol (PROVENTIL HFA;VENTOLIN HFA) 108 (90 BASE) MCG/ACT inhaler Inhale 2 puffs into the lungs every 6 (six) hours as needed for wheezing or shortness of breath. Patient not taking: Reported on 12/12/2014 10/08/14   Micheline Chapman, NP  HYDROcodone-acetaminophen (NORCO/VICODIN) 5-325 MG per tablet Take 1 tablet by mouth every 4 (four) hours as needed. Patient not taking: Reported on 12/12/2014 10/07/14   Caren Griffins, MD  levofloxacin (LEVAQUIN) 750 MG tablet Take 1 tablet (750 mg total) by mouth daily. Patient not taking: Reported on 12/12/2014 10/07/14   Caren Griffins, MD  oseltamivir (TAMIFLU) 75 MG capsule Take 1 capsule (75 mg total) by mouth 2 (two) times daily. Patient not taking: Reported on 12/12/2014 10/07/14   Caren Griffins, MD  promethazine (PHENERGAN) 25 MG tablet Take 1 tablet (25 mg total) by mouth every 8 (eight) hours as needed for nausea or vomiting (nausea/vomiting). Patient not taking: Reported on 12/12/2014 10/08/14   Micheline Chapman, NP   BP 109/83 mmHg  Pulse 83  Temp(Src) 98.3 F (36.8 C) (Oral)  Resp 15  SpO2 96% Physical Exam  Constitutional: He is oriented to person, place, and time. He appears well-developed and well-nourished.  HENT:  Head: Normocephalic and atraumatic.  Cardiovascular: Normal rate and regular rhythm.   No murmur heard. Pulmonary/Chest: Effort normal. No respiratory distress.  End expiratory wheezes bilaterally  Abdominal: Soft. There is no tenderness. There is no rebound and no guarding.  Musculoskeletal: He exhibits no edema or tenderness.  Neurological: He is alert and oriented to person, place, and time.  Skin: Skin is warm and dry.  Psychiatric: He has a normal mood and affect. His behavior is normal.  Nursing note  and vitals reviewed.   ED Course  Procedures (including critical care time) Labs Review Labs Reviewed - No data to display  Imaging Review Dg Chest 2 View  12/12/2014   CLINICAL DATA:  Cough, chest pain  EXAM: CHEST  2 VIEW  COMPARISON:  10/05/2014  FINDINGS: Lungs are clear.  No pleural effusion or pneumothorax.  The heart is normal in size.  Visualized osseous structures are within normal limits.  IMPRESSION: No evidence of acute cardiopulmonary disease.   Electronically Signed   By: Julian Hy M.D.   On: 12/12/2014 15:26     EKG Interpretation None      MDM   Final diagnoses:  Acute exacerbation of COPD with asthma    Pt here for SOB/cough, has hx/o pna and asthma.  Exam and hx c/w COPD/Asthma exacerbation.  Pt improved in ED after albuterol, no respiratory distress in department.  Presentation not c/w pna, CHF, PE. Discussed home care and return precautions.     Quintella Reichert, MD 12/12/14 [phone]

## 2014-12-12 NOTE — ED Notes (Signed)
Pt reports recent pneumonia one month ago, pt reports return of symptoms this week. Having productive cough with green sputum and wheezing. Airway intact at triage, no resp distress noted. Denies fever.

## 2015-08-17 ENCOUNTER — Emergency Department (HOSPITAL_COMMUNITY)
Admission: EM | Admit: 2015-08-17 | Discharge: 2015-08-17 | Disposition: A | Discharge status: Home / Self Care | Payer: Self-pay | Attending: Emergency Medicine | Admitting: Emergency Medicine

## 2015-08-17 ENCOUNTER — Emergency Department (HOSPITAL_COMMUNITY): Payer: Self-pay

## 2015-08-17 ENCOUNTER — Encounter (HOSPITAL_COMMUNITY): Payer: Self-pay

## 2015-08-17 DIAGNOSIS — Z792 Long term (current) use of antibiotics: Secondary | ICD-10-CM | POA: Insufficient documentation

## 2015-08-17 DIAGNOSIS — N50819 Testicular pain, unspecified: Secondary | ICD-10-CM | POA: Insufficient documentation

## 2015-08-17 DIAGNOSIS — F17210 Nicotine dependence, cigarettes, uncomplicated: Secondary | ICD-10-CM | POA: Insufficient documentation

## 2015-08-17 DIAGNOSIS — R319 Hematuria, unspecified: Secondary | ICD-10-CM | POA: Insufficient documentation

## 2015-08-17 DIAGNOSIS — J45909 Unspecified asthma, uncomplicated: Secondary | ICD-10-CM | POA: Insufficient documentation

## 2015-08-17 DIAGNOSIS — Z8719 Personal history of other diseases of the digestive system: Secondary | ICD-10-CM | POA: Insufficient documentation

## 2015-08-17 DIAGNOSIS — R300 Dysuria: Secondary | ICD-10-CM | POA: Insufficient documentation

## 2015-08-17 DIAGNOSIS — R3915 Urgency of urination: Secondary | ICD-10-CM | POA: Insufficient documentation

## 2015-08-17 DIAGNOSIS — Z7952 Long term (current) use of systemic steroids: Secondary | ICD-10-CM | POA: Insufficient documentation

## 2015-08-17 DIAGNOSIS — R1011 Right upper quadrant pain: Secondary | ICD-10-CM | POA: Insufficient documentation

## 2015-08-17 DIAGNOSIS — Z8659 Personal history of other mental and behavioral disorders: Secondary | ICD-10-CM | POA: Insufficient documentation

## 2015-08-17 DIAGNOSIS — R109 Unspecified abdominal pain: Secondary | ICD-10-CM

## 2015-08-17 DIAGNOSIS — Z880 Allergy status to penicillin: Secondary | ICD-10-CM | POA: Insufficient documentation

## 2015-08-17 DIAGNOSIS — R112 Nausea with vomiting, unspecified: Secondary | ICD-10-CM | POA: Insufficient documentation

## 2015-08-17 DIAGNOSIS — Z79899 Other long term (current) drug therapy: Secondary | ICD-10-CM | POA: Insufficient documentation

## 2015-08-17 DIAGNOSIS — Z87442 Personal history of urinary calculi: Secondary | ICD-10-CM | POA: Insufficient documentation

## 2015-08-17 HISTORY — DX: Calculus of kidney: N20.0

## 2015-08-17 LAB — I-STAT CHEM 8, ED
BUN: 16 mg/dL (ref 6–20)
CALCIUM ION: 1.18 mmol/L (ref 1.12–1.23)
CHLORIDE: 103 mmol/L (ref 101–111)
CREATININE: 0.9 mg/dL (ref 0.61–1.24)
GLUCOSE: 98 mg/dL (ref 65–99)
HCT: 51 % (ref 39.0–52.0)
Hemoglobin: 17.3 g/dL — ABNORMAL HIGH (ref 13.0–17.0)
Potassium: 4 mmol/L (ref 3.5–5.1)
Sodium: 142 mmol/L (ref 135–145)
TCO2: 27 mmol/L (ref 0–100)

## 2015-08-17 LAB — CBC WITH DIFFERENTIAL/PLATELET
Basophils Absolute: 0 10*3/uL (ref 0.0–0.1)
Basophils Relative: 0 %
EOS ABS: 0.1 10*3/uL (ref 0.0–0.7)
Eosinophils Relative: 1 %
HEMATOCRIT: 47 % (ref 39.0–52.0)
HEMOGLOBIN: 16.5 g/dL (ref 13.0–17.0)
LYMPHS ABS: 2.8 10*3/uL (ref 0.7–4.0)
LYMPHS PCT: 29 %
MCH: 32.9 pg (ref 26.0–34.0)
MCHC: 35.1 g/dL (ref 30.0–36.0)
MCV: 93.6 fL (ref 78.0–100.0)
Monocytes Absolute: 0.8 10*3/uL (ref 0.1–1.0)
Monocytes Relative: 8 %
NEUTROS PCT: 62 %
Neutro Abs: 5.9 10*3/uL (ref 1.7–7.7)
Platelets: 221 10*3/uL (ref 150–400)
RBC: 5.02 MIL/uL (ref 4.22–5.81)
RDW: 12.9 % (ref 11.5–15.5)
WBC: 9.6 10*3/uL (ref 4.0–10.5)

## 2015-08-17 LAB — URINALYSIS, ROUTINE W REFLEX MICROSCOPIC
Glucose, UA: NEGATIVE mg/dL
KETONES UR: 40 mg/dL — AB
NITRITE: NEGATIVE
PH: 5.5 (ref 5.0–8.0)
Protein, ur: NEGATIVE mg/dL
Specific Gravity, Urine: 1.023 (ref 1.005–1.030)

## 2015-08-17 LAB — URINE MICROSCOPIC-ADD ON

## 2015-08-17 LAB — HEPATIC FUNCTION PANEL
ALT: 47 U/L (ref 17–63)
AST: 51 U/L — ABNORMAL HIGH (ref 15–41)
Albumin: 4.4 g/dL (ref 3.5–5.0)
Alkaline Phosphatase: 99 U/L (ref 38–126)
BILIRUBIN DIRECT: 0.2 mg/dL (ref 0.1–0.5)
BILIRUBIN TOTAL: 0.9 mg/dL (ref 0.3–1.2)
Indirect Bilirubin: 0.7 mg/dL (ref 0.3–0.9)
Total Protein: 7.7 g/dL (ref 6.5–8.1)

## 2015-08-17 LAB — LIPASE, BLOOD: LIPASE: 26 U/L (ref 11–51)

## 2015-08-17 MED ORDER — PROMETHAZINE HCL 12.5 MG RE SUPP: 12.5000 mg | Freq: Four times a day (QID) | RECTAL | Status: DC | PRN

## 2015-08-17 MED ORDER — NAPROXEN 500 MG PO TABS: 500.0000 mg | ORAL_TABLET | Freq: Two times a day (BID) | ORAL | Status: DC

## 2015-08-17 MED ORDER — ONDANSETRON HCL 4 MG/2ML IJ SOLN
4.0000 mg | Freq: Once | INTRAMUSCULAR | Status: AC
  Administered 2015-08-17: 4 mg via INTRAVENOUS
  Filled 2015-08-17: qty 2

## 2015-08-17 MED ORDER — KETOROLAC TROMETHAMINE 30 MG/ML IJ SOLN
30.0000 mg | Freq: Once | INTRAMUSCULAR | Status: AC
  Administered 2015-08-17: 30 mg via INTRAVENOUS
  Filled 2015-08-17: qty 1

## 2015-08-17 NOTE — ED Provider Notes (Signed)
CSN: [id number]     Arrival date & time 08/17/15  1244 History   First MD Initiated Contact with Patient 08/17/15 1815     Chief Complaint  Patient presents with  . Flank Pain  . Dysuria     (Consider location/radiation/quality/duration/timing/severity/associated sxs/prior Treatment) HPI [name] [name]. is a 35 y.o. male with history of Crohn's disease, polysubstance abuse, kidney stones, presents to emergency department complaining of right flank pain. Patient reports his symptoms started about 3 days ago. He reports pain in her right flank that radiates into the right groin and testes. He denies any testicular swelling. He did report some dysuria and urinary frequency. Denies hematuria. He reports associated nausea and vomiting that started yesterday. He reports history of kidney stones, however states this pain is worse than prior kidney stone. He states pain is constant but comes and goes in severity. He denies any fever or chills. No changes in his bowels. He has taken ibuprofen with no relief of his symptoms. Nothing is making his symptoms better or worse.  Past Medical History  Diagnosis Date  . Crohn disease ([place]) Dx 2004    terminal ileum Crohn's s/p ileocolectomy with anastamosis  . Bipolar 1 disorder (HCC)     Self laceration and self harm behaviors   . Substance abuse   . Depression   . Anxiety   . Asthma     mild   . Kidney stones    Past Surgical History  Procedure Laterality Date  . Colon surgery  01/17/2006  . Inguinal hernia repair      On the right and the left   . Tonsillectomy      20 yrs ago  . Esophagogastroduodenoscopy  09/04/2011    Procedure: ESOPHAGOGASTRODUODENOSCOPY (EGD);  Surgeon: Estanislado Emms., MD,FACG;  Location: [facility];  Service: Endoscopy;  Laterality: N/A;   Family History  Problem Relation Age of Onset  . Hypertension Other   . Diabetes Other    Social History  Substance Use Topics  . Smoking status: Current Every Day  Smoker -- 0.25 packs/day    Types: Cigarettes  . Smokeless tobacco: Never Used  . Alcohol Use: Yes     Comment: occsionally    Review of Systems  Constitutional: Negative for fever and chills.  Respiratory: Negative for cough, chest tightness and shortness of breath.   Cardiovascular: Negative for chest pain, palpitations and leg swelling.  Gastrointestinal: Positive for nausea, vomiting and abdominal pain. Negative for diarrhea and abdominal distention.  Genitourinary: Positive for dysuria, urgency, flank pain and testicular pain. Negative for frequency, hematuria, discharge, penile swelling, scrotal swelling and penile pain.  Musculoskeletal: Negative for myalgias, arthralgias, neck pain and neck stiffness.  Skin: Negative for rash.  Allergic/Immunologic: Negative for immunocompromised state.  Neurological: Negative for dizziness, weakness, light-headedness, numbness and headaches.  All other systems reviewed and are negative.     Allergies  Penicillins and Diphenhydramine hcl  Home Medications   Prior to Admission medications   Medication Sig Start Date End Date Taking? Authorizing Provider  albuterol (PROVENTIL HFA;VENTOLIN HFA) 108 (90 BASE) MCG/ACT inhaler Inhale 2 puffs into the lungs every 6 (six) hours as needed for wheezing or shortness of breath. 10/08/14  Yes Micheline Chapman, NP  ibuprofen (ADVIL,MOTRIN) 200 MG tablet Take 600 mg by mouth every 6 (six) hours as needed for moderate pain.   Yes Historical Provider, MD  azithromycin (ZITHROMAX Z-PAK) 250 MG tablet Take 1 tablet (250 mg total) by  mouth daily. Take two tablets by mouth on day one followed by one tablet daily on days two through five 12/12/14   Quintella Reichert, MD  HYDROcodone-acetaminophen (NORCO/VICODIN) 5-325 MG per tablet Take 1 tablet by mouth every 6 (six) hours as needed. 12/12/14   Quintella Reichert, MD  predniSONE (DELTASONE) 10 MG tablet Take 4 tablets (40 mg total) by mouth daily. 12/12/14   Quintella Reichert, MD   BP 104/72 mmHg  Pulse 84  Temp(Src) 98.1 F (36.7 C) (Oral)  Resp 18  SpO2 100% Physical Exam  Constitutional: He appears well-developed and well-nourished. No distress.  HENT:  Head: Normocephalic and atraumatic.  Eyes: Conjunctivae are normal.  Neck: Neck supple.  Cardiovascular: Normal rate, regular rhythm and normal heart sounds.   Pulmonary/Chest: Effort normal. No respiratory distress. He has no wheezes. He has no rales.  Abdominal: Soft. Bowel sounds are normal. He exhibits no distension. There is no tenderness. There is no rebound.  Right cva tenderness. RUQ tenderness  Musculoskeletal: He exhibits no edema.  Neurological: He is alert.  Skin: Skin is warm and dry.  Nursing note and vitals reviewed.   ED Course  Procedures (including critical care time) Labs Review Labs Reviewed  URINALYSIS, ROUTINE W REFLEX MICROSCOPIC (NOT AT [facility]) - Abnormal; Notable for the following:    Color, Urine AMBER (*)    APPearance CLOUDY (*)    Hgb urine dipstick LARGE (*)    Bilirubin Urine MODERATE (*)    Ketones, ur 40 (*)    Leukocytes, UA TRACE (*)    All other components within normal limits  URINE MICROSCOPIC-ADD ON - Abnormal; Notable for the following:    Squamous Epithelial / LPF 0-5 (*)    Bacteria, UA RARE (*)    All other components within normal limits  CBC WITH DIFFERENTIAL/PLATELET  I-STAT CHEM 8, ED    Imaging Review Ct Renal Stone Study  08/17/2015  CLINICAL DATA:  Right flank pain and dysuria for 3 days. Initial encounter. EXAM: CT ABDOMEN AND PELVIS WITHOUT CONTRAST TECHNIQUE: Multidetector CT imaging of the abdomen and pelvis was performed following the standard protocol without IV contrast. COMPARISON:  CT abdomen and pelvis 10/05/2014. FINDINGS: Extensive and severe emphysematous change is present in the lung bases. No pleural or pericardial effusion. Cyst in the upper pole of the right kidney is unchanged. No renal or ureteral stones are identified.  There is no hydronephrosis. The left kidney and urinary bladder are unremarkable. The gallbladder, liver, spleen, adrenal glands and pancreas appear normal. Surgical anastomosis is identified at the cecum. The appendix has likely been removed. The small bowel and colon are otherwise unremarkable. The stomach appears normal. There is no lymphadenopathy or fluid. No focal bony abnormality is identified. IMPRESSION: Negative for urinary tract stone. No acute finding chest or abdomen. Advanced emphysema. Electronically Signed   By: Inge Rise M.D.   On: 08/17/2015 19:03   I have personally reviewed and evaluated these images and lab results as part of my medical decision-making.   EKG Interpretation None      MDM   Final diagnoses:  Flank pain  Hematuria   Pt with 3 days of right flank pain radiating into right groin, dysuria, urgency. Hx of kidney stones. Will check UA, labs, CT renal. Toradol and zofran ordered for symptoms.   8:57 PM Urinalysis showing large hemoglobin, 2 numerous to count red blood cells, rare bacteria. Negative nitrites and trace leukocytes. We will send culture, however I do not  think he has a UTI. At this time he is afebrile, nontoxic appearing. Blood work is all within normal except for AST of 51. CT abdomen and pelvis was obtained to rule out kidney stone and is negative. Will discharge home with NSAIDs, Phenergan for nausea. Will follow up with urology regarding hematuria. Urine cultures are pending. Return precautions discussed. Pt was able to drink and ate a sandwitch in ED with no difficulty.   Medications  ketorolac (TORADOL) 30 MG/ML injection 30 mg (30 mg Intravenous Given 08/17/15 1919)  ondansetron (ZOFRAN) injection 4 mg (4 mg Intravenous Given 08/17/15 1919)    Filed Vitals:   08/17/15 1318 08/17/15 2052  BP: 104/72 98/70  Pulse: 84 84  Temp: 98.1 F (36.7 C)   TempSrc: Oral   Resp: 18 14  SpO2: 100% 93%       Jeannett Senior,  PA-C 08/17/15 2058  Deno Etienne, DO 08/17/15 2117

## 2015-08-17 NOTE — ED Notes (Addendum)
Patient c/o right flank pain, voiding small amounts, and dysuria. Patient states a history of kidney stones 8 years ago.

## 2015-08-17 NOTE — Discharge Instructions (Signed)
Naprosyn for pain and inflammation. Phenergan for nausea. Please follow up with urology regarding your hematuria.    Flank Pain Flank pain refers to pain that is located on the side of the body between the upper abdomen and the back. The pain may occur over a short period of time (acute) or may be long-term or reoccurring (chronic). It may be mild or severe. Flank pain can be caused by many things. CAUSES  Some of the more common causes of flank pain include:  Muscle strains.   Muscle spasms.   A disease of your spine (vertebral disk disease).   A lung infection (pneumonia).   Fluid around your lungs (pulmonary edema).   A kidney infection.   Kidney stones.   A very painful skin rash caused by the chickenpox virus (shingles).   Gallbladder disease.  [place] care will depend on the cause of your pain. In general,  Rest as directed by your caregiver.  Drink enough fluids to keep your urine clear or pale yellow.  Only take over-the-counter or prescription medicines as directed by your caregiver. Some medicines may help relieve the pain.  Tell your caregiver about any changes in your pain.  Follow up with your caregiver as directed. SEEK IMMEDIATE MEDICAL CARE IF:   Your pain is not controlled with medicine.   You have new or worsening symptoms.  Your pain increases.   You have abdominal pain.   You have shortness of breath.   You have persistent nausea or vomiting.   You have swelling in your abdomen.   You feel faint or pass out.   You have blood in your urine.  You have a fever or persistent symptoms for more than 2-3 days.  You have a fever and your symptoms suddenly get worse. MAKE SURE YOU:   Understand these instructions.  Will watch your condition.  Will get help right away if you are not doing well or get worse.   This information is not intended to replace advice given to you by your health care provider.  Make sure you discuss any questions you have with your health care provider.   Document Released: 08/31/2005 Document Revised: 04/03/2012 Document Reviewed: 02/22/2012 Elsevier Interactive Patient Education [employer].

## 2015-08-19 LAB — URINE CULTURE

## 2015-10-11 ENCOUNTER — Other Ambulatory Visit: Payer: Self-pay | Admitting: Family Medicine

## 2015-11-24 ENCOUNTER — Other Ambulatory Visit: Payer: Self-pay | Admitting: Family Medicine

## 2015-12-31 ENCOUNTER — Emergency Department (HOSPITAL_COMMUNITY): Payer: Medicaid Other

## 2015-12-31 ENCOUNTER — Encounter (HOSPITAL_COMMUNITY): Payer: Self-pay | Admitting: Emergency Medicine

## 2015-12-31 ENCOUNTER — Emergency Department (HOSPITAL_COMMUNITY)
Admission: EM | Admit: 2015-12-31 | Discharge: 2016-01-01 | Discharge status: Against Medical Advice | Payer: Medicaid Other | Attending: Dermatology | Admitting: Dermatology

## 2015-12-31 DIAGNOSIS — R51 Headache: Secondary | ICD-10-CM | POA: Insufficient documentation

## 2015-12-31 DIAGNOSIS — K0889 Other specified disorders of teeth and supporting structures: Secondary | ICD-10-CM | POA: Diagnosis not present

## 2015-12-31 DIAGNOSIS — R05 Cough: Secondary | ICD-10-CM | POA: Insufficient documentation

## 2015-12-31 DIAGNOSIS — Z5321 Procedure and treatment not carried out due to patient leaving prior to being seen by health care provider: Secondary | ICD-10-CM | POA: Insufficient documentation

## 2015-12-31 NOTE — ED Notes (Signed)
Pt called for in waiting area for vital signs. No response

## 2015-12-31 NOTE — ED Notes (Signed)
Pt. reports left facial pain and chronic productive cough for several days , pt. had multiple dental extractions this week unrelieved by prescription pain medication .

## 2016-02-11 ENCOUNTER — Encounter: Payer: Self-pay | Admitting: Gastroenterology

## 2016-02-27 ENCOUNTER — Emergency Department (HOSPITAL_COMMUNITY): Payer: Medicaid Other

## 2016-02-27 ENCOUNTER — Encounter (HOSPITAL_COMMUNITY): Payer: Self-pay | Admitting: Emergency Medicine

## 2016-02-27 ENCOUNTER — Observation Stay (HOSPITAL_COMMUNITY)
Admission: EM | Admit: 2016-02-27 | Discharge: 2016-02-29 | Disposition: A | Discharge status: Home / Self Care | Payer: Medicaid Other | Attending: Internal Medicine | Admitting: Internal Medicine

## 2016-02-27 DIAGNOSIS — R109 Unspecified abdominal pain: Secondary | ICD-10-CM | POA: Diagnosis not present

## 2016-02-27 DIAGNOSIS — B1920 Unspecified viral hepatitis C without hepatic coma: Secondary | ICD-10-CM | POA: Insufficient documentation

## 2016-02-27 DIAGNOSIS — F17210 Nicotine dependence, cigarettes, uncomplicated: Secondary | ICD-10-CM | POA: Diagnosis not present

## 2016-02-27 DIAGNOSIS — Z791 Long term (current) use of non-steroidal anti-inflammatories (NSAID): Secondary | ICD-10-CM | POA: Insufficient documentation

## 2016-02-27 DIAGNOSIS — F1121 Opioid dependence, in remission: Secondary | ICD-10-CM

## 2016-02-27 DIAGNOSIS — Z79899 Other long term (current) drug therapy: Secondary | ICD-10-CM | POA: Insufficient documentation

## 2016-02-27 DIAGNOSIS — F1910 Other psychoactive substance abuse, uncomplicated: Secondary | ICD-10-CM | POA: Diagnosis present

## 2016-02-27 DIAGNOSIS — Z681 Body mass index (BMI) 19 or less, adult: Secondary | ICD-10-CM | POA: Diagnosis not present

## 2016-02-27 DIAGNOSIS — J449 Chronic obstructive pulmonary disease, unspecified: Secondary | ICD-10-CM | POA: Insufficient documentation

## 2016-02-27 DIAGNOSIS — E43 Unspecified severe protein-calorie malnutrition: Secondary | ICD-10-CM | POA: Diagnosis not present

## 2016-02-27 DIAGNOSIS — K5090 Crohn's disease, unspecified, without complications: Principal | ICD-10-CM | POA: Insufficient documentation

## 2016-02-27 DIAGNOSIS — R64 Cachexia: Secondary | ICD-10-CM | POA: Diagnosis not present

## 2016-02-27 DIAGNOSIS — R197 Diarrhea, unspecified: Secondary | ICD-10-CM | POA: Diagnosis not present

## 2016-02-27 DIAGNOSIS — R1084 Generalized abdominal pain: Secondary | ICD-10-CM | POA: Diagnosis present

## 2016-02-27 DIAGNOSIS — K50919 Crohn's disease, unspecified, with unspecified complications: Secondary | ICD-10-CM | POA: Diagnosis present

## 2016-02-27 DIAGNOSIS — F319 Bipolar disorder, unspecified: Secondary | ICD-10-CM | POA: Insufficient documentation

## 2016-02-27 DIAGNOSIS — R112 Nausea with vomiting, unspecified: Secondary | ICD-10-CM | POA: Diagnosis not present

## 2016-02-27 DIAGNOSIS — E872 Acidosis: Secondary | ICD-10-CM | POA: Diagnosis not present

## 2016-02-27 DIAGNOSIS — Z9049 Acquired absence of other specified parts of digestive tract: Secondary | ICD-10-CM | POA: Diagnosis not present

## 2016-02-27 DIAGNOSIS — F32A Depression, unspecified: Secondary | ICD-10-CM | POA: Diagnosis present

## 2016-02-27 DIAGNOSIS — F17200 Nicotine dependence, unspecified, uncomplicated: Secondary | ICD-10-CM

## 2016-02-27 DIAGNOSIS — J45909 Unspecified asthma, uncomplicated: Secondary | ICD-10-CM | POA: Diagnosis present

## 2016-02-27 DIAGNOSIS — F329 Major depressive disorder, single episode, unspecified: Secondary | ICD-10-CM | POA: Diagnosis present

## 2016-02-27 LAB — COMPREHENSIVE METABOLIC PANEL
ALT: 43 U/L (ref 17–63)
AST: 36 U/L (ref 15–41)
Albumin: 3.5 g/dL (ref 3.5–5.0)
Alkaline Phosphatase: 106 U/L (ref 38–126)
Anion gap: 9 (ref 5–15)
BILIRUBIN TOTAL: 0.5 mg/dL (ref 0.3–1.2)
BUN: 11 mg/dL (ref 6–20)
CHLORIDE: 101 mmol/L (ref 101–111)
CO2: 21 mmol/L — ABNORMAL LOW (ref 22–32)
CREATININE: 0.68 mg/dL (ref 0.61–1.24)
Calcium: 8.7 mg/dL — ABNORMAL LOW (ref 8.9–10.3)
GFR calc Af Amer: >60 mL/min (ref >60)
GLUCOSE: 160 mg/dL — AB (ref 65–99)
Potassium: 3.5 mmol/L (ref 3.5–5.1)
Sodium: 131 mmol/L — ABNORMAL LOW (ref 135–145)
TOTAL PROTEIN: 7 g/dL (ref 6.5–8.1)

## 2016-02-27 LAB — URINALYSIS, ROUTINE W REFLEX MICROSCOPIC
Glucose, UA: NEGATIVE mg/dL
Hgb urine dipstick: NEGATIVE
Ketones, ur: 15 mg/dL — AB
Leukocytes, UA: NEGATIVE
Nitrite: NEGATIVE
Protein, ur: NEGATIVE mg/dL
Specific Gravity, Urine: 1.027 (ref 1.005–1.030)
pH: 6.5 (ref 5.0–8.0)

## 2016-02-27 LAB — CBC
HCT: 47.1 % (ref 39.0–52.0)
Hemoglobin: 16.3 g/dL (ref 13.0–17.0)
MCH: 32.7 pg (ref 26.0–34.0)
MCHC: 34.6 g/dL (ref 30.0–36.0)
MCV: 94.4 fL (ref 78.0–100.0)
Platelets: 309 10*3/uL (ref 150–400)
RBC: 4.99 MIL/uL (ref 4.22–5.81)
RDW: 12.1 % (ref 11.5–15.5)
WBC: 9.5 10*3/uL (ref 4.0–10.5)

## 2016-02-27 LAB — LIPASE, BLOOD: Lipase: 46 U/L (ref 11–51)

## 2016-02-27 MED ORDER — ENSURE ENLIVE PO LIQD
237.0000 mL | Freq: Two times a day (BID) | ORAL | Status: DC
  Administered 2016-02-28 (×2): 237 mL via ORAL

## 2016-02-27 MED ORDER — ADULT MULTIVITAMIN W/MINERALS CH
1.0000 | ORAL_TABLET | Freq: Every day | ORAL | Status: DC
  Administered 2016-02-28 – 2016-02-29 (×2): 1 via ORAL
  Filled 2016-02-27 (×2): qty 1

## 2016-02-27 MED ORDER — IOPAMIDOL (ISOVUE-300) INJECTION 61%
INTRAVENOUS | Status: AC
  Administered 2016-02-27: 75 mL
  Filled 2016-02-27: qty 75

## 2016-02-27 MED ORDER — FENTANYL CITRATE (PF) 100 MCG/2ML IJ SOLN: 50.0000 ug | Freq: Once | INTRAMUSCULAR | Status: DC

## 2016-02-27 MED ORDER — IPRATROPIUM-ALBUTEROL 0.5-2.5 (3) MG/3ML IN SOLN
3.0000 mL | Freq: Two times a day (BID) | RESPIRATORY_TRACT | Status: DC | PRN
  Administered 2016-02-29: 3 mL via RESPIRATORY_TRACT
  Filled 2016-02-27: qty 3

## 2016-02-27 MED ORDER — ACETAMINOPHEN 650 MG RE SUPP: 650.0000 mg | Freq: Four times a day (QID) | RECTAL | Status: DC | PRN

## 2016-02-27 MED ORDER — NICOTINE 21 MG/24HR TD PT24
21.0000 mg | MEDICATED_PATCH | Freq: Every day | TRANSDERMAL | Status: DC
Start: 2016-02-28 — End: 2016-02-29
  Administered 2016-02-28 – 2016-02-29 (×2): 21 mg via TRANSDERMAL
  Filled 2016-02-27 (×2): qty 1

## 2016-02-27 MED ORDER — DIATRIZOATE MEGLUMINE & SODIUM 66-10 % PO SOLN
ORAL | Status: AC
  Filled 2016-02-27: qty 30

## 2016-02-27 MED ORDER — ACETAMINOPHEN 325 MG PO TABS
650.0000 mg | ORAL_TABLET | Freq: Four times a day (QID) | ORAL | Status: DC | PRN
Start: 2016-02-27 — End: 2016-02-29
  Administered 2016-02-28: 650 mg via ORAL
  Filled 2016-02-27: qty 2

## 2016-02-27 MED ORDER — BUPRENORPHINE HCL 8 MG SL SUBL
8.0000 mg | SUBLINGUAL_TABLET | Freq: Three times a day (TID) | SUBLINGUAL | Status: DC
  Administered 2016-02-27 – 2016-02-29 (×6): 8 mg via SUBLINGUAL
  Filled 2016-02-27 (×6): qty 1

## 2016-02-27 MED ORDER — ONDANSETRON HCL 4 MG/2ML IJ SOLN
4.0000 mg | Freq: Once | INTRAMUSCULAR | Status: AC
Start: 2016-02-27 — End: 2016-02-27
  Administered 2016-02-27: 4 mg via INTRAVENOUS
  Filled 2016-02-27: qty 2

## 2016-02-27 MED ORDER — KETOROLAC TROMETHAMINE 15 MG/ML IJ SOLN
15.0000 mg | Freq: Once | INTRAMUSCULAR | Status: AC
  Administered 2016-02-27: 15 mg via INTRAVENOUS
  Filled 2016-02-27: qty 1

## 2016-02-27 MED ORDER — SODIUM CHLORIDE 0.9 % IV BOLUS (SEPSIS)
1000.0000 mL | Freq: Once | INTRAVENOUS | Status: AC
  Administered 2016-02-27: 1000 mL via INTRAVENOUS

## 2016-02-27 MED ORDER — SODIUM CHLORIDE 0.9 % IV SOLN
Freq: Once | INTRAVENOUS | Status: AC
  Administered 2016-02-27: 19:00:00 via INTRAVENOUS

## 2016-02-27 MED ORDER — METHYLPREDNISOLONE SODIUM SUCC 125 MG IJ SOLR
125.0000 mg | Freq: Once | INTRAMUSCULAR | Status: AC
  Administered 2016-02-27: 125 mg via INTRAVENOUS
  Filled 2016-02-27: qty 2

## 2016-02-27 MED ORDER — ENOXAPARIN SODIUM 40 MG/0.4ML ~~LOC~~ SOLN
40.0000 mg | Freq: Every day | SUBCUTANEOUS | Status: DC
  Administered 2016-02-27: 40 mg via SUBCUTANEOUS
  Filled 2016-02-27: qty 0.4

## 2016-02-27 MED ORDER — MORPHINE SULFATE (PF) 4 MG/ML IV SOLN
4.0000 mg | Freq: Once | INTRAVENOUS | Status: AC
  Administered 2016-02-27: 4 mg via INTRAVENOUS
  Filled 2016-02-27: qty 1

## 2016-02-27 MED ORDER — ONDANSETRON HCL 4 MG/2ML IJ SOLN: 4.0000 mg | Freq: Four times a day (QID) | INTRAMUSCULAR | Status: DC | PRN

## 2016-02-27 MED ORDER — PANTOPRAZOLE SODIUM 40 MG PO TBEC
40.0000 mg | DELAYED_RELEASE_TABLET | Freq: Every day | ORAL | Status: DC
Start: 2016-02-27 — End: 2016-02-29
  Administered 2016-02-27 – 2016-02-28 (×2): 40 mg via ORAL
  Filled 2016-02-27 (×2): qty 1

## 2016-02-27 MED ORDER — CLINDAMYCIN HCL 300 MG PO CAPS
300.0000 mg | ORAL_CAPSULE | Freq: Four times a day (QID) | ORAL | Status: DC
  Administered 2016-02-27 – 2016-02-28 (×3): 300 mg via ORAL
  Filled 2016-02-27 (×3): qty 1

## 2016-02-27 MED ORDER — ONDANSETRON HCL 4 MG/2ML IJ SOLN
4.0000 mg | Freq: Once | INTRAMUSCULAR | Status: AC
  Administered 2016-02-27: 4 mg via INTRAVENOUS
  Filled 2016-02-27: qty 2

## 2016-02-27 MED ORDER — SODIUM CHLORIDE 0.9 % IV SOLN
INTRAVENOUS | Status: AC
  Administered 2016-02-27 – 2016-02-28 (×2): via INTRAVENOUS

## 2016-02-27 MED ORDER — METRONIDAZOLE 500 MG PO TABS
500.0000 mg | ORAL_TABLET | Freq: Three times a day (TID) | ORAL | Status: DC
  Administered 2016-02-27 – 2016-02-28 (×2): 500 mg via ORAL
  Filled 2016-02-27 (×2): qty 1

## 2016-02-27 MED ORDER — TIOTROPIUM BROMIDE MONOHYDRATE 18 MCG IN CAPS
18.0000 ug | ORAL_CAPSULE | Freq: Every day | RESPIRATORY_TRACT | Status: DC
Start: 2016-02-28 — End: 2016-02-29
  Administered 2016-02-28 – 2016-02-29 (×2): 18 ug via RESPIRATORY_TRACT
  Filled 2016-02-27: qty 5

## 2016-02-27 MED ORDER — PREDNISONE 50 MG PO TABS
60.0000 mg | ORAL_TABLET | Freq: Every day | ORAL | Status: DC
  Administered 2016-02-28 – 2016-02-29 (×2): 60 mg via ORAL
  Filled 2016-02-27 (×2): qty 1

## 2016-02-27 MED ORDER — KETOROLAC TROMETHAMINE 15 MG/ML IJ SOLN
15.0000 mg | Freq: Three times a day (TID) | INTRAMUSCULAR | Status: DC
Start: 2016-02-27 — End: 2016-02-28
  Administered 2016-02-27 – 2016-02-28 (×2): 15 mg via INTRAVENOUS
  Filled 2016-02-27 (×2): qty 1

## 2016-02-27 MED ORDER — SERTRALINE HCL 50 MG PO TABS
50.0000 mg | ORAL_TABLET | Freq: Every day | ORAL | Status: DC
  Administered 2016-02-28 – 2016-02-29 (×2): 50 mg via ORAL
  Filled 2016-02-27 (×2): qty 1

## 2016-02-27 MED ORDER — GABAPENTIN 300 MG PO CAPS
600.0000 mg | ORAL_CAPSULE | Freq: Two times a day (BID) | ORAL | Status: DC
  Administered 2016-02-27 – 2016-02-29 (×4): 600 mg via ORAL
  Filled 2016-02-27 (×5): qty 2

## 2016-02-27 NOTE — ED Notes (Signed)
Patient transported to CT 

## 2016-02-27 NOTE — H&P (Signed)
Date: 02/27/2016               Patient Name:  [name]. MRN: [id number]  DOB: 08-Jan-1981 Age / Sex: 35 y.o., male   PCP: Marliss Coots, NP         Medical Service: Internal Medicine Teaching Service         Attending Physician: Dr. Bartholomew Crews, MD    First Contact: Dr. Ledell Noss, MD Pager: [phone]  Second Contact: Dr. Charlott Rakes, MD Pager: [phone]       After Hours (After 5p/  First Contact Pager: [phone]  weekends / holidays): Second Contact Pager: [phone]   Chief Complaint: abdominal pain, nausea, vomiting.  History of Present Illness: This is a 35 year old male with MHx significant for Chrons disease, asthma, bipolar disorder and prior substance abuse who presents to the hospital for evaluation of several weeks of abdominal pain, nausea and vomiting. The patient reports that he has just "not been feeling well" for about 2 weeks. He states he's had fevers, chills, abdominal pain "all over" with some nausea, a few episodes of vomiting and "some" diarrhea. He reports the last time he had symptoms like this it was a Chron's flare several years ago. He also reports a decreased appetite with a 20lb weight loss over the past 2 weeks. The patient reports its been several years since he's seen a doctor regarding his Chron's disease but has an appointment with GI in September. Pt states he follows with a PCP outpatient but they do not manage his Chron's disease. The patient also reports that he was started on Clindamycin and Flagyl for a dental-related infection approximately 2 weeks ago and that he started feeling "bad" around this time. He reports he has dental surgery scheduled for next week and is on antibiotics until that time. He denies any dysuria, blood in his stool, frequent diarrhea, constipation, headache, productive cough, chest pain or increased shortness of breath.  Meds:  No current facility-administered medications on file prior to encounter.     Current Outpatient Prescriptions on File Prior to Encounter  Medication Sig Dispense Refill  . albuterol (PROVENTIL HFA;VENTOLIN HFA) 108 (90 BASE) MCG/ACT inhaler Inhale 2 puffs into the lungs every 6 (six) hours as needed for wheezing or shortness of breath. 1 Inhaler 11  . ibuprofen (ADVIL,MOTRIN) 200 MG tablet Take 400-600 mg by mouth every 6 (six) hours as needed for moderate pain.     [name] Kitchen HYDROcodone-acetaminophen (NORCO/VICODIN) 5-325 MG per tablet Take 1 tablet by mouth every 6 (six) hours as needed. (Patient not taking: Reported on 02/27/2016) 4 tablet 0  . naproxen (NAPROSYN) 500 MG tablet Take 1 tablet (500 mg total) by mouth 2 (two) times daily. (Patient not taking: Reported on 02/27/2016) 30 tablet 0  . promethazine (PHENERGAN) 12.5 MG suppository Place 1 suppository (12.5 mg total) rectally every 6 (six) hours as needed for nausea or vomiting. (Patient not taking: Reported on 02/27/2016) 12 each 0    Allergies: Allergies as of 02/27/2016 - Review Complete 02/27/2016  Allergen Reaction Noted  . Penicillins Anaphylaxis and Swelling 07/22/2011  . Diphenhydramine hcl Hives and Rash 07/22/2011   Past Medical History:  Diagnosis Date  . Anxiety   . Asthma    mild   . Bipolar 1 disorder (HCC)    Self laceration and self harm behaviors   . Crohn disease ([place]) Dx 2004   terminal ileum Crohn's s/p ileocolectomy with anastamosis  .  Depression   . Kidney stones   . Substance abuse     Family History: HTN, DM  Social History:  Tobacco: current daily smoker, 1/2 pk per day Alcohol: occasional Drug use: marijuana, former heroin abuse  Review of Systems: A complete ROS was negative except as per HPI.   Physical Exam: Blood pressure 114/87, pulse 82, temperature 98.9 F (37.2 C), temperature source Oral, resp. rate 18, height 5' 5"  (1.651 m), weight 39 kg (86 lb), SpO2 96 %. General: thin, somewhat cachectic appearing male resting comfortably in bed. In no acute distress. ENT:  oral cavity without apparent ulcerations. Patient adentate.  Cardiovascular: Regular rate and rhythm Pulmonary: diffuse wheezes scattered throughout, no rales or rhonchi Abdomen: mildly tender throughout, most prominent in the epigastrium. No rebound tenderness, guarding or rigidity. Vertical scar present on lower abdomen, secondary to ileocolectomy with anastomosis.  Extremities: No edema present. Scars present on left forearm, secondary to history of self harm.    CT Abdomen Pelvis W/ Contrast: Showed emphysema. Without any acute abnormality.  Assessment & Plan by Problem: Principal Problem:   Crohn disease ([place]) Active Problems:   Asthma   Protein-calorie malnutrition, severe ([place])   Depression   Substance abuse  1. Chron Disease, Moderate flare Patients 20lb weight loss over 2 weeks, abdominal tenderness with nausea and vomiting, points to a moderate CD flare.  Patient given loading dose of Solumedrol in ED and will start patient on Prednisone tomorrow.  IVF running. Patient given 4L ns in ED.  Would recommend an outpatient trial of sulfalazine or mesalamine.   2. Severe Protein Calorie Malnutrition Patient's BMI 14% and reports a 20lb weight loss over 2 weeks. Patient started on Ensure supplementation, given a multivitamin and nutrition has been consulted. No anemia demonstrated on H&H.  3. Asthma Patient has long-standing history of asthma without recent exacerbation. Wheezing on exam. Patient continued on home medications. Will monitor.  4. History of substance abuse Patient chronically on suboxone as an outpatient. Pt reports last heroin use "a few months ago."  Pharmacy to dose.  Dispo: Admit patient to Observation with expected length of stay less than 2 midnights.  IVF: ns @ 54m/hr Diet: per nutrition consult DVT Prophylaxis: Lovenox  Signed: Juwana Thoreson, DO 02/27/2016, 7:54 PM  Pager: [phone]

## 2016-02-27 NOTE — ED Provider Notes (Signed)
[facility] DEPT Provider Note   CSN: [id number] Arrival date & time: 02/27/16  1251  First Provider Contact:  First MD Initiated Contact with Patient 02/27/16 1447     History   Chief Complaint Chief Complaint  Patient presents with  . Fever  . Nausea  . Abdominal Pain    HPI [name]. is a 35 y.o. male.  The history is provided by the patient and a relative. No language interpreter was used.  Abdominal Pain   This is a recurrent problem. The current episode started more than 1 week ago. The problem occurs constantly. The problem has been gradually worsening. The pain is associated with eating and a previous surgery. The pain is located in the generalized abdominal region. The pain is at a severity of 7/10. Associated symptoms include anorexia, fever, diarrhea, nausea and vomiting. Pertinent negatives include belching, constipation, dysuria, hematuria, headaches, arthralgias and myalgias. Nothing aggravates the symptoms. Nothing relieves the symptoms. Past workup includes GI consult. His past medical history is significant for Crohn's disease.    Past Medical History:  Diagnosis Date  . Anxiety   . Asthma    mild   . Bipolar 1 disorder (HCC)    Self laceration and self harm behaviors   . Crohn disease ([place]) Dx 2004   terminal ileum Crohn's s/p ileocolectomy with anastamosis  . Depression   . Kidney stones   . Substance abuse     Patient Active Problem List   Diagnosis Date Noted  . Influenza with pneumonia 10/07/2014  . Protein-calorie malnutrition, severe ([place]) 10/06/2014  . Sepsis ([place]) 10/05/2014  . CAP (community acquired pneumonia) 10/05/2014  . Asthma 10/05/2014  . Abdominal pain   . Hematemesis 09/04/2011  . ANEMIA 09/20/2009  . BIPOLAR DISORDER UNSPECIFIED 09/20/2009  . ADHD 09/20/2009  . Regional enteritis ([place]) 09/20/2009  . INTESTINAL OBSTRUCTION, HX OF 09/20/2009  . COCAINE ABUSE, HX OF 09/20/2009    Past Surgical History:    Procedure Laterality Date  . COLON SURGERY  01/17/2006  . ESOPHAGOGASTRODUODENOSCOPY  09/04/2011   Procedure: ESOPHAGOGASTRODUODENOSCOPY (EGD);  Surgeon: Estanislado Emms., MD,FACG;  Location: [facility];  Service: Endoscopy;  Laterality: N/A;  . INGUINAL HERNIA REPAIR     On the right and the left   . TONSILLECTOMY     20 yrs ago       Home Medications    Prior to Admission medications   Medication Sig Start Date End Date Taking? Authorizing Provider  albuterol (PROVENTIL HFA;VENTOLIN HFA) 108 (90 BASE) MCG/ACT inhaler Inhale 2 puffs into the lungs every 6 (six) hours as needed for wheezing or shortness of breath. 10/08/14   Micheline Chapman, NP  azithromycin (ZITHROMAX Z-PAK) 250 MG tablet Take 1 tablet (250 mg total) by mouth daily. Take two tablets by mouth on day one followed by one tablet daily on days two through five 12/12/14   Quintella Reichert, MD  HYDROcodone-acetaminophen (NORCO/VICODIN) 5-325 MG per tablet Take 1 tablet by mouth every 6 (six) hours as needed. 12/12/14   Quintella Reichert, MD  ibuprofen (ADVIL,MOTRIN) 200 MG tablet Take 600 mg by mouth every 6 (six) hours as needed for moderate pain.    Historical Provider, MD  naproxen (NAPROSYN) 500 MG tablet Take 1 tablet (500 mg total) by mouth 2 (two) times daily. 08/17/15   Tatyana Kirichenko, PA-C  predniSONE (DELTASONE) 10 MG tablet Take 4 tablets (40 mg total) by mouth daily. 12/12/14   Quintella Reichert, MD  promethazine (  PHENERGAN) 12.5 MG suppository Place 1 suppository (12.5 mg total) rectally every 6 (six) hours as needed for nausea or vomiting. 08/17/15   Jeannett Senior, PA-C    Family History Family History  Problem Relation Age of Onset  . Hypertension Other   . Diabetes Other     Social History Social History  Substance Use Topics  . Smoking status: Current Every Day Smoker    Packs/day: 0.25    Types: Cigarettes  . Smokeless tobacco: Never Used  . Alcohol use Yes     Comment: occsionally      Allergies   Penicillins and Diphenhydramine hcl   Review of Systems Review of Systems  Constitutional: Positive for fever. Negative for chills.  HENT: Negative for ear pain and sore throat.   Eyes: Negative for pain and visual disturbance.  Respiratory: Negative for cough and shortness of breath.   Cardiovascular: Negative for chest pain and palpitations.  Gastrointestinal: Positive for abdominal pain, anorexia, diarrhea, nausea and vomiting. Negative for constipation.  Genitourinary: Negative for dysuria and hematuria.  Musculoskeletal: Negative for arthralgias, back pain and myalgias.  Skin: Negative for color change and rash.  Neurological: Negative for seizures, syncope and headaches.  All other systems reviewed and are negative.    Physical Exam Updated Vital Signs BP 117/94   Pulse 80   Temp 98.9 F (37.2 C) (Oral)   Resp 18   Ht 5' 5"  (1.651 m)   Wt 39 kg   SpO2 97%   BMI 14.31 kg/m   Physical Exam  Constitutional: He is oriented to person, place, and time. He appears cachectic.  Non-toxic appearance. He has a sickly appearance.  HENT:  Head: Normocephalic and atraumatic.  Eyes: Conjunctivae are normal.  Neck: Neck supple.  Cardiovascular: Regular rhythm.  Tachycardia present.   No murmur heard. Pulmonary/Chest: Effort normal and breath sounds normal. No respiratory distress.  Abdominal: Soft. He exhibits no mass. There is tenderness. There is no rebound and no guarding.  Musculoskeletal: He exhibits no edema.  Neurological: He is alert and oriented to person, place, and time.  Skin: Skin is warm and dry.  Psychiatric: He has a normal mood and affect.  Nursing note and vitals reviewed.    ED Treatments / Results  Labs (all labs ordered are listed, but only abnormal results are displayed) Labs Reviewed  COMPREHENSIVE METABOLIC PANEL - Abnormal; Notable for the following:       Result Value   Sodium 131 (*)    CO2 21 (*)    Glucose, Bld 160 (*)     Calcium 8.7 (*)    All other components within normal limits  URINALYSIS, ROUTINE W REFLEX MICROSCOPIC (NOT AT [facility]) - Abnormal; Notable for the following:    Color, Urine AMBER (*)    Bilirubin Urine SMALL (*)    Ketones, ur 15 (*)    All other components within normal limits  LIPASE, BLOOD  CBC    EKG  EKG Interpretation None       Radiology Ct Abdomen Pelvis W Contrast  Result Date: 02/27/2016 CLINICAL DATA:  Mid abdominal pain with nausea, vomiting and diarrhea. History of Crohn's disease. EXAM: CT ABDOMEN AND PELVIS WITH CONTRAST TECHNIQUE: Multidetector CT imaging of the abdomen and pelvis was performed using the standard protocol following bolus administration of intravenous contrast. CONTRAST:  75 ml ISOVUE-300 IOPAMIDOL (ISOVUE-300) INJECTION 61% COMPARISON:  CT abdomen and pelvis 08/17/2015 and 10/05/2014. FINDINGS: Extensive emphysematous disease is seen in the lung bases.  No pleural or pericardial effusion. Heart size is normal. The liver, gallbladder, spleen, adrenal glands, pancreas and left kidney appear normal. Cyst in the upper pole of the right kidney is unchanged. Surgical anastomosis is again seen at the cecum. The stomach and small and large bowel appear normal. No lymphadenopathy or fluid. No bony abnormality. IMPRESSION: No acute abnormality or finding to explain the patient's symptoms. Emphysema. Electronically Signed   By: Inge Rise M.D.   On: 02/27/2016 18:10    Procedures Procedures (including critical care time)  Medications Ordered in ED Medications  diatrizoate meglumine-sodium (GASTROGRAFIN) 66-10 % solution (not administered)  sodium chloride 0.9 % bolus 1,000 mL (0 mLs Intravenous Stopped 02/27/16 1557)  ondansetron (ZOFRAN) injection 4 mg (4 mg Intravenous Given 02/27/16 1431)  ketorolac (TORADOL) 15 MG/ML injection 15 mg (15 mg Intravenous Given 02/27/16 1500)  sodium chloride 0.9 % bolus 1,000 mL (0 mLs Intravenous Stopped 02/27/16 1557)   methylPREDNISolone sodium succinate (SOLU-MEDROL) 125 mg/2 mL injection 125 mg (125 mg Intravenous Given 02/27/16 1555)  iopamidol (ISOVUE-300) 61 % injection (75 mLs  Contrast Given 02/27/16 1747)  ondansetron (ZOFRAN) injection 4 mg (4 mg Intravenous Given 02/27/16 1851)  morphine 4 MG/ML injection 4 mg (4 mg Intravenous Given 02/27/16 1851)  0.9 %  sodium chloride infusion ( Intravenous New Bag/Given 02/27/16 1851)     Initial Impression / Assessment and Plan / ED Course  I have reviewed the triage vital signs and the nursing notes.  Pertinent labs & imaging results that were available during my care of the patient were reviewed by me and considered in my medical decision making (see chart for details).  Clinical Course    Patient is a 35 year old gentleman with history of Crohn's disease status post partial colectomy who presents via private vehicle for evaluation and treatment of one month of nausea, vomiting, diffuse abdominal pain, inability to tolerate oral intake.  Upon arrival patient with stable vital signs. He is alert and oriented but appears cachectic and sickly. Patient with diffuse abdominal tenderness to palpation.  CT scan performed showed no acute abnormalities. Urinalysis with ketones, specific gravity 1.027. Lipase unremarkable, CMP with mild metabolic acidosis.  Patient given normal saline bolus, pain, nausea medications.  Suspect Crohn's flare, Solu-Medrol given, will admit for fluid hydration, GI evaluation. Patient states he is unable to get into an outpatient gastroenterologist until September.  Patient admitted with no further emergency department events.  Discussed case with my attending, Dr. Roxanne Mins.  Final Clinical Impressions(s) / ED Diagnoses   Final diagnoses:  Generalized abdominal pain  Crohn's disease with complication, unspecified gastrointestinal tract location [facility])    New Prescriptions New Prescriptions   No medications on file     Vira Blanco, MD 22/48/25 0037    Delora Fuel, MD 04/88/89 1694

## 2016-02-27 NOTE — ED Triage Notes (Signed)
Pt c/o N/V and abdominal pain with fever x 1 week.

## 2016-02-28 DIAGNOSIS — R197 Diarrhea, unspecified: Secondary | ICD-10-CM

## 2016-02-28 DIAGNOSIS — J449 Chronic obstructive pulmonary disease, unspecified: Secondary | ICD-10-CM | POA: Diagnosis not present

## 2016-02-28 DIAGNOSIS — F1990 Other psychoactive substance use, unspecified, uncomplicated: Secondary | ICD-10-CM

## 2016-02-28 DIAGNOSIS — F329 Major depressive disorder, single episode, unspecified: Secondary | ICD-10-CM

## 2016-02-28 DIAGNOSIS — R1084 Generalized abdominal pain: Secondary | ICD-10-CM | POA: Diagnosis not present

## 2016-02-28 DIAGNOSIS — K50119 Crohn's disease of large intestine with unspecified complications: Secondary | ICD-10-CM | POA: Diagnosis not present

## 2016-02-28 DIAGNOSIS — K50919 Crohn's disease, unspecified, with unspecified complications: Secondary | ICD-10-CM | POA: Diagnosis not present

## 2016-02-28 DIAGNOSIS — R824 Acetonuria: Secondary | ICD-10-CM

## 2016-02-28 DIAGNOSIS — B1920 Unspecified viral hepatitis C without hepatic coma: Secondary | ICD-10-CM

## 2016-02-28 DIAGNOSIS — F17210 Nicotine dependence, cigarettes, uncomplicated: Secondary | ICD-10-CM

## 2016-02-28 DIAGNOSIS — E43 Unspecified severe protein-calorie malnutrition: Secondary | ICD-10-CM | POA: Diagnosis not present

## 2016-02-28 DIAGNOSIS — K5090 Crohn's disease, unspecified, without complications: Secondary | ICD-10-CM | POA: Diagnosis not present

## 2016-02-28 LAB — BASIC METABOLIC PANEL
Anion gap: 5 (ref 5–15)
BUN: 13 mg/dL (ref 6–20)
CALCIUM: 8 mg/dL — AB (ref 8.9–10.3)
CO2: 23 mmol/L (ref 22–32)
CREATININE: 0.58 mg/dL — AB (ref 0.61–1.24)
Chloride: 109 mmol/L (ref 101–111)
GFR calc non Af Amer: >60 mL/min (ref >60)
Glucose, Bld: 111 mg/dL — ABNORMAL HIGH (ref 65–99)
Potassium: 3.9 mmol/L (ref 3.5–5.1)
Sodium: 137 mmol/L (ref 135–145)

## 2016-02-28 LAB — C DIFFICILE QUICK SCREEN W PCR REFLEX
C Diff antigen: NEGATIVE
C Diff interpretation: NOT DETECTED
C Diff toxin: NEGATIVE

## 2016-02-28 LAB — SEDIMENTATION RATE: Sed Rate: 5 mm/hr (ref 0–16)

## 2016-02-28 MED ORDER — ALBUTEROL SULFATE HFA 108 (90 BASE) MCG/ACT IN AERS: 2.0000 | INHALATION_SPRAY | Freq: Four times a day (QID) | RESPIRATORY_TRACT | Status: DC | PRN

## 2016-02-28 MED ORDER — ENOXAPARIN SODIUM 30 MG/0.3ML ~~LOC~~ SOLN
30.0000 mg | Freq: Every day | SUBCUTANEOUS | Status: DC
  Administered 2016-02-28: 30 mg via SUBCUTANEOUS
  Filled 2016-02-28: qty 0.3

## 2016-02-28 MED ORDER — ALBUTEROL SULFATE (2.5 MG/3ML) 0.083% IN NEBU: 2.5000 mg | INHALATION_SOLUTION | Freq: Four times a day (QID) | RESPIRATORY_TRACT | Status: DC | PRN

## 2016-02-28 MED ORDER — ENSURE ENLIVE PO LIQD
237.0000 mL | Freq: Three times a day (TID) | ORAL | Status: DC
  Administered 2016-02-28 – 2016-02-29 (×3): 237 mL via ORAL

## 2016-02-28 MED ORDER — ALBUTEROL SULFATE (2.5 MG/3ML) 0.083% IN NEBU
2.5000 mg | INHALATION_SOLUTION | Freq: Four times a day (QID) | RESPIRATORY_TRACT | Status: DC | PRN
  Administered 2016-02-28: 2.5 mg via RESPIRATORY_TRACT
  Filled 2016-02-28: qty 3

## 2016-02-28 NOTE — Progress Notes (Signed)
Subjective: Today Mr. Terhune states he is found sitting in bed picking at his breakfast. He says he is having some abdominal pain today but that it has improved some since admission. He still feels nauseous. He tells [place] that he lives in a youth facility for substance rehabilitation.  Objective:  Vital signs in last 24 hours: Vitals:   02/27/16 2125 02/27/16 2207 02/28/16 0600 02/28/16 0807  BP: 111/83 118/84 108/76   Pulse: 68 65 67   Resp: 19  18   Temp: 98 F (36.7 C)  98 F (36.7 C)   TempSrc: Oral  Oral   SpO2: 95%  98% 96%  Weight:      Height:       24-hour weight change: Weight change:  Intake/Output:  08/06 0701 - 08/07 0700 In: 1640 [P.O.:640; I.V.:1000] Out: -    Physical Exam  Constitutional: He appears well-developed. No distress.  Malnourished caucasian male   Cardiovascular: Normal rate and regular rhythm.   Murmur heard. Pulmonary/Chest: Effort normal. No respiratory distress. He has no wheezes. He has no rales.  Abdominal: Soft. Bowel sounds are normal. He exhibits no distension. There is tenderness.  Mild tenderness throughout most prominent in left upper quadrant  Neurological: He is alert.  Extremities: distal pulses intact, no peripheral edema   Labs: CBC:  Recent Labs Lab 02/27/16 1308  WBC 9.5  HGB 16.3  HCT 47.1  MCV 94.4  PLT 300   Metabolic Panel:  Recent Labs Lab 02/27/16 1308 02/28/16 0540  NA 131* 137  K 3.5 3.9  CL 101 109  CO2 21* 23  GLUCOSE 160* 111*  BUN 11 13  CREATININE 0.68 0.58*  CALCIUM 8.7* 8.0*  ALT 43  --   ALKPHOS 106  --   BILITOT 0.5  --   PROT 7.0  --   ALBUMIN 3.5  --   LIPASE 46  --    Microbiology: c diff toxin and antigen negative   Imaging: CT abdomen: emphysema, No acute abdominal abnormaliy Chest xray: No active cardiopulmonary disease EKG: normal sinus rhythm    Medications: Infusions: . sodium chloride 100 mL/hr at 02/28/16 0522   Scheduled Medications: . buprenorphine  8 mg  Sublingual Q8H  . clindamycin  300 mg Oral QID  . enoxaparin (LOVENOX) injection  40 mg Subcutaneous QHS  . feeding supplement (ENSURE ENLIVE)  237 mL Oral BID BM  . gabapentin  600 mg Oral BID  . metroNIDAZOLE  500 mg Oral TID  . multivitamin with minerals  1 tablet Oral Daily  . nicotine  21 mg Transdermal Daily  . pantoprazole  40 mg Oral QHS  . predniSONE  60 mg Oral Q breakfast  . sertraline  50 mg Oral Daily  . tiotropium  18 mcg Inhalation Daily   PRN Medications: acetaminophen **OR** acetaminophen, ipratropium-albuterol, ondansetron (ZOFRAN) IV  Assessment/Plan: Pt is a 35 y.o. yo male with a PMHx of Chron's disease (s/p ileocolectomy with anastomosis) who was admitted on 02/27/2016 with symptoms of abdominal pain, nausea/vomiting, and diarrhea which was determined to be secondary to chron's flare.   Principal Problem:   Crohn disease ([place]) Active Problems:   Asthma   Protein-calorie malnutrition, severe ([place])   Depression   Substance abuse  Abdominal pain- chron's diagnoses 2002, s/p ileocolectomy with anastomosis 2007. Taking Flagyl and Clinda for the past 12 days for a dental sinus infection. His symptoms started around the time of beginning antibiotics so we have stopped those for now.  Cdiff negative. He is on suboxone at home, was given toradol in the ED however we are holding any meds that can exacerbate gastric inflammation so holding NSAIDs for now. He has not had consistent outpatient GI follow up and does not take any home medications for chrons so GI was consulted and recommendations are greatly appreciated. Last colonoscopy was in 2007. Sed rate 5 but tested after high dose steroids.    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 He describes prior PFTs with primary care Dr. Alphonzo Grieve (ph: [phone], fax: [phone]) we have contacted them and requested medical records. Denies any family history of cystic fibrosis, considering this is a genetic disorder this would be an unlikely dx for him. Ordered home albuterol, duonebs, and spiriva.   ordered alpha-1 antitrypsin antibody   Hep C pt reports he was tested through his PCP, states he tested negative for HIV  Substance abuse continue home subutex    Ria Clock may be fasting ketosis in the setting of decreased appetite.   Depression continue home Gabapentin and sertraline   Dispo: Anticipated discharge in approximately 3-5 day(s).   LOS: 0 days   Ledell Noss, MD 02/28/2016, 8:14 AM Pager: [phone]

## 2016-02-28 NOTE — Progress Notes (Signed)
Pt was admitted from ED per stretcher accompanied by a nurse tech, self introduced to pt, ID bracelet checked with another nurse, skin acessment also done with another nurse, fall prevention plan discussed with pt, admission package given, pt oriented to the unit and equipment, call light and phone within reach and pt able to demonstrate how to use them, treatment started as prescribed and will continue to monitor

## 2016-02-28 NOTE — Progress Notes (Signed)
Initial Nutrition Assessment  DOCUMENTATION CODES:   Underweight, Severe malnutrition in context of chronic illness  INTERVENTION:   -Increase Ensure Enlive po to TID, each supplement provides 350 kcal and 20 grams of protein -Continue MVI daily -Downgrade diet to dysphagia 2 consistency for ease of intake  NUTRITION DIAGNOSIS:   Malnutrition related to chronic illness as evidenced by severe depletion of muscle mass, severe depletion of body fat, percent weight loss.  GOAL:   Patient will meet greater than or equal to 90% of their needs  MONITOR:   PO intake, Supplement acceptance, Labs, Weight trends, Skin, I & O's  REASON FOR ASSESSMENT:   Consult, Malnutrition Screening Tool Poor PO  ASSESSMENT:   This is a 35 year old male with MHx significant for Chrons disease, asthma, bipolar disorder and prior substance abuse who presents to the hospital for evaluation of several weeks of abdominal pain, nausea and vomiting. The patient reports that he has just "not been feeling well" for about 2 weeks.   Pt admitted with moderate crohn's flare.   Hx obtained from pt and mother at bedside. Per mother, pt has had very minimal intake (just bites and sips) for the past 2 weeks. She shares that pt typically has a very hearty appetite and generally consumes 3 meals per day. Pt reports he has been trying to drink Ensure with meals to obtain additional calories and protein, however, is inconsistent with this practice due to expense of supplements.   Pt reports he has always been small framed, estimating UBW around 110-120#. Per wt hx, pt was experienced a 14.9% wt loss over the past 2 months, which is consistent for time frame.   Pt reports he consumed most of his breakfast this AM and consumed an Ensure supplement. Pt does not have teeth and shares that it is difficult for him to consume foods that require extensive chewing due to pain ("I can feel my jaw bone"). He is amenable to diet  downgrade for ease of intake.   Nutrition-Focused physical exam completed. Findings are moderate to severe fat depletion, moderate to severe muscle depletion, and no edema.   UDS positive for cannabis.   Labs reviewed.   Diet Order:  Diet regular Room service appropriate? Yes; Fluid consistency: Thin  Skin:  Reviewed, no issues  Last BM:  02/28/16  Height:   Ht Readings from Last 1 Encounters:  02/27/16 5' 5"  (1.651 m)    Weight:   Wt Readings from Last 1 Encounters:  02/27/16 86 lb (39 kg)    Ideal Body Weight:  61.8 kg  BMI:  Body mass index is 14.31 kg/m.  Estimated Nutritional Needs:   Kcal:  1400-1600  Protein:  55-70 grams  Fluid:  1.4-1.6 L  EDUCATION NEEDS:   Education needs addressed  Duell Holdren A. Jimmye Norman, RD, LDN, CDE Pager: [phone] After hours Pager: [phone]

## 2016-02-28 NOTE — Consult Note (Signed)
Referring Provider: Dr. Lynnae January Primary Care Physician:  Carmie Kanner, NP Primary Gastroenterologist:  Dr. Olevia Perches previously   Reason for Consultation:  Crohn's disease  HPI: [name]. is a 35 y.o. male with PMHx significant for Crohn's disease s/p resection of the terminal ileum and cecum in 2007, asthma, bipolar disorder, and prior substance abuse who presents to the hospital for evaluation of several weeks of abdominal pain, nausea and vomiting. The patient reports that he has just "not been feeling well" for about 2 weeks. He states he's had fevers, chills, abdominal pain "all over" with nausea, a several episodes of vomiting and "some" diarrhea.  He also reports a decreased appetite with a 20 lb weight loss over the past 2 weeks.  Has not seen GI for his Crohn's or really been on any medication for this since his surgery.  Had been on Pentasa in the past.  The patient also reports that he was started on Clindamycin and Flagyl for a dental-related infection approximately a few weeks ago and that he started feeling "bad" around this time. He reports he has dental surgery scheduled for next week and is on antibiotics until that time.  He reports some diarrhea previously, but that is much worse since being on the antibiotics (Cdiff negative).  Over the weekend had a lot of nausea and vomiting but also had family members with similar symptoms recently as well.  No outstanding abdominal pain.  Says that it is "sore"; he thinks from vomiting/retching.  CT scan of the abdomen and pelvis unremarkable.  Pathology from surgery/resection in 12/2005:  [place], SEGMENTAL RESECTION: - INFLAMMATION CONSISTENT WITH CROHN'S. - NINETEEN BENIGN LYMPH NODES.  Pathology from colonoscopy 01/2003 although we do not have procedure report:  ENDOSCOPIC BIOPSY, TERMINAL ILEUM:  CHRONIC ACTIVE ILEITIS INCLUDING NECROINFLAMMATORY DEBRIS CONSISTENT WITH ULCERATION, SEE COMMENT.   Past Medical  History:  Diagnosis Date  . Anxiety   . Asthma    mild   . Bipolar 1 disorder (HCC)    Self laceration and self harm behaviors   . Crohn disease ([place]) Dx 2004   terminal ileum Crohn's s/p ileocolectomy with anastamosis  . Depression   . Kidney stones   . Substance abuse     Past Surgical History:  Procedure Laterality Date  . COLON SURGERY  01/17/2006  . ESOPHAGOGASTRODUODENOSCOPY  09/04/2011   Procedure: ESOPHAGOGASTRODUODENOSCOPY (EGD);  Surgeon: Estanislado Emms., MD,FACG;  Location: [facility];  Service: Endoscopy;  Laterality: N/A;  . INGUINAL HERNIA REPAIR     On the right and the left   . TONSILLECTOMY     20 yrs ago    Prior to Admission medications   Medication Sig Start Date End Date Taking? Authorizing Provider  albuterol (PROVENTIL HFA;VENTOLIN HFA) 108 (90 BASE) MCG/ACT inhaler Inhale 2 puffs into the lungs every 6 (six) hours as needed for wheezing or shortness of breath. 10/08/14  Yes Micheline Chapman, NP  Buprenorphine HCl-Naloxone HCl (SUBOXONE) 8-2 MG FILM Place 1 Film under the tongue 3 (three) times daily.   Yes Historical Provider, MD  clindamycin (CLEOCIN) 300 MG capsule Take 300 mg by mouth 4 (four) times daily. #56 filled 02/16/16   Yes Historical Provider, MD  gabapentin (NEURONTIN) 300 MG capsule Take 600 mg by mouth 2 (two) times daily. 02/02/16  Yes Historical Provider, MD  ibuprofen (ADVIL,MOTRIN) 200 MG tablet Take 400-600 mg by mouth every 6 (six) hours as needed for moderate pain.  Yes Historical Provider, MD  ipratropium-albuterol (DUONEB) 0.5-2.5 (3) MG/3ML SOLN Take 3 mLs by nebulization 2 (two) times daily as needed (shortness of breath/ wheezing).   Yes Historical Provider, MD  metroNIDAZOLE (FLAGYL) 500 MG tablet Take 500 mg by mouth 4 (four) times daily. #56 filled 02/16/16   Yes Historical Provider, MD  nicotine (NICODERM CQ - DOSED IN MG/24 HOURS) 21 mg/24hr patch Place 21 mg onto the skin daily.   Yes Historical Provider, MD  promethazine  (PHENERGAN) 25 MG tablet Take 25 mg by mouth 3 (three) times daily as needed for nausea or vomiting.  02/22/16  Yes Historical Provider, MD  sertraline (ZOLOFT) 50 MG tablet Take 50 mg by mouth daily.   Yes Historical Provider, MD  tiotropium (SPIRIVA) 18 MCG inhalation capsule Place 18 mcg into inhaler and inhale daily.   Yes Historical Provider, MD  HYDROcodone-acetaminophen (NORCO/VICODIN) 5-325 MG per tablet Take 1 tablet by mouth every 6 (six) hours as needed. Patient not taking: Reported on 02/27/2016 12/12/14   Quintella Reichert, MD  naproxen (NAPROSYN) 500 MG tablet Take 1 tablet (500 mg total) by mouth 2 (two) times daily. Patient not taking: Reported on 02/27/2016 08/17/15   Jeannett Senior, PA-C  promethazine (PHENERGAN) 12.5 MG suppository Place 1 suppository (12.5 mg total) rectally every 6 (six) hours as needed for nausea or vomiting. Patient not taking: Reported on 02/27/2016 08/17/15   Jeannett Senior, PA-C    Current Facility-Administered Medications  Medication Dose Route Frequency Provider Last Rate Last Dose  . 0.9 %  sodium chloride infusion   Intravenous Continuous Juliet Rude, MD 100 mL/hr at 02/28/16 0522    . acetaminophen (TYLENOL) tablet 650 mg  650 mg Oral Q6H PRN Juliet Rude, MD   650 mg at 02/28/16 1131   Or  . acetaminophen (TYLENOL) suppository 650 mg  650 mg Rectal Q6H PRN Juliet Rude, MD      . albuterol (PROVENTIL) (2.5 MG/3ML) 0.083% nebulizer solution 2.5 mg  2.5 mg Nebulization Q6H PRN Bartholomew Crews, MD   2.5 mg at 02/28/16 1258  . buprenorphine (SUBUTEX) sublingual tablet 8 mg  8 mg Sublingual Q8H Erenest Blank, RPH   8 mg at 02/28/16 0529  . clindamycin (CLEOCIN) capsule 300 mg  300 mg Oral QID Juliet Rude, MD   300 mg at 02/28/16 1006  . enoxaparin (LOVENOX) injection 40 mg  40 mg Subcutaneous QHS Juliet Rude, MD   40 mg at 02/27/16 2211  . feeding supplement (ENSURE ENLIVE) (ENSURE ENLIVE) liquid 237 mL  237 mL Oral BID BM Carly J Rivet, MD    237 mL at 02/28/16 1006  . gabapentin (NEURONTIN) capsule 600 mg  600 mg Oral BID Juliet Rude, MD   600 mg at 02/28/16 1006  . ipratropium-albuterol (DUONEB) 0.5-2.5 (3) MG/3ML nebulizer solution 3 mL  3 mL Nebulization BID PRN Carly J Rivet, MD      . metroNIDAZOLE (FLAGYL) tablet 500 mg  500 mg Oral TID Juliet Rude, MD   500 mg at 02/28/16 1006  . multivitamin with minerals tablet 1 tablet  1 tablet Oral Daily Juliet Rude, MD   1 tablet at 02/28/16 1006  . nicotine (NICODERM CQ - dosed in mg/24 hours) patch 21 mg  21 mg Transdermal Daily Juliet Rude, MD   21 mg at 02/28/16 1006  . ondansetron (ZOFRAN) injection 4 mg  4 mg Intravenous Q6H PRN Juliet Rude, MD      .  pantoprazole (PROTONIX) EC tablet 40 mg  40 mg Oral QHS Juliet Rude, MD   40 mg at 02/27/16 2211  . predniSONE (DELTASONE) tablet 60 mg  60 mg Oral Q breakfast Juliet Rude, MD   60 mg at 02/28/16 1006  . sertraline (ZOLOFT) tablet 50 mg  50 mg Oral Daily Juliet Rude, MD   50 mg at 02/28/16 1006  . tiotropium (SPIRIVA) inhalation capsule 18 mcg  18 mcg Inhalation Daily Juliet Rude, MD   18 mcg at 02/28/16 0806    Allergies as of 02/27/2016 - Review Complete 02/27/2016  Allergen Reaction Noted  . Penicillins Anaphylaxis and Swelling 07/22/2011  . Diphenhydramine hcl Hives and Rash 07/22/2011    Family History  Problem Relation Age of Onset  . Hypertension Other   . Diabetes Other     Social History   Social History  . Marital status: Single    Spouse name: N/A  . Number of children: N/A  . Years of education: N/A   Occupational History  . Not on file.   Social History Main Topics  . Smoking status: Current Every Day Smoker    Packs/day: 0.25    Types: Cigarettes  . Smokeless tobacco: Never Used  . Alcohol use Yes     Comment: occsionally  . Drug use:     Frequency: 2.0 times per week    Types: Marijuana, Heroin     Comment: occasionally.   . Sexual activity: Yes    Birth control/ protection:  None   Other Topics Concern  . Not on file   Social History Narrative   Lives at home with mother     Review of Systems: Ten point ROS is O/W negative except as mentioned in HPI.  Physical Exam: Vital signs in last 24 hours: Temp:  [98 F (36.7 C)] 98 F (36.7 C) (08/07 0600) Pulse Rate:  [65-101] 67 (08/07 0600) Resp:  [14-19] 18 (08/07 0600) BP: (107-128)/(76-94) 108/76 (08/07 0600) SpO2:  [93 %-99 %] 96 % (08/07 0807) Last BM Date: 02/28/16 General:  Alert, thin, pleasant and cooperative in NAD Head:  Normocephalic and atraumatic. Eyes:  Sclera clear, no icterus.  Conjunctiva pink. Ears:  Normal auditory acuity. Mouth:  No deformity or lesions.   Lungs:  Clear throughout to auscultation.  No wheezes, crackles, or rhonchi.  Heart:  Regular rate and rhythm; no murmurs, clicks, rubs,  or gallops. Abdomen:  Soft, non-distended.  BS present.  No significant TTP.  Msk:  Symmetrical without gross deformities.  Pulses:  Normal pulses noted. Extremities:  Without clubbing or edema. Neurologic:  Alert and oriented x 4;  grossly normal neurologically. Skin:  Intact without significant lesions or rashes. Psych:  Alert and cooperative. Normal mood and affect. Lab Results:  Recent Labs  02/27/16 1308  WBC 9.5  HGB 16.3  HCT 47.1  PLT 309   BMET  Recent Labs  02/27/16 1308 02/28/16 0540  NA 131* 137  K 3.5 3.9  CL 101 109  CO2 21* 23  GLUCOSE 160* 111*  BUN 11 13  CREATININE 0.68 0.58*  CALCIUM 8.7* 8.0*   LFT  Recent Labs  02/27/16 1308  PROT 7.0  ALBUMIN 3.5  AST 36  ALT 43  ALKPHOS 106  BILITOT 0.5   Studies/Results: Ct Abdomen Pelvis W Contrast  Result Date: 02/27/2016 CLINICAL DATA:  Mid abdominal pain with nausea, vomiting and diarrhea. History of Crohn's disease. EXAM: CT ABDOMEN AND PELVIS WITH  CONTRAST TECHNIQUE: Multidetector CT imaging of the abdomen and pelvis was performed using the standard protocol following bolus administration of  intravenous contrast. CONTRAST:  75 ml ISOVUE-300 IOPAMIDOL (ISOVUE-300) INJECTION 61% COMPARISON:  CT abdomen and pelvis 08/17/2015 and 10/05/2014. FINDINGS: Extensive emphysematous disease is seen in the lung bases. No pleural or pericardial effusion. Heart size is normal. The liver, gallbladder, spleen, adrenal glands, pancreas and left kidney appear normal. Cyst in the upper pole of the right kidney is unchanged. Surgical anastomosis is again seen at the cecum. The stomach and small and large bowel appear normal. No lymphadenopathy or fluid. No bony abnormality. IMPRESSION: No acute abnormality or finding to explain the patient's symptoms. Emphysema. Electronically Signed   By: Inge Rise M.D.   On: 02/27/2016 18:10   IMPRESSION/PLAN:  -35 year old male with history of Crohn's disease s/p ileocecectomy in 2007 and really not on medication since that time.  Currently admitted with complaints of nausea, vomiting worse over the past few days and diarrhea, worse over the past couple of months.  Other family members also had nausea and vomiting.  Patient on Clindamycin and Flagyl for the past several weeks for dental infections.  CT scan ok.  At this point I am not convinced that the patient has active Crohn's flare.  His symptoms are likely combination of viral gastroenteritis and side effects of antibiotics (Cdiff negative).  That being said, I am going to check sed rate and CRP as well.  He is currently on prednisone 60 mg daily and we may be able to discontinue that or at least taper quickly.  He has an outpatient appt with Dr. Havery Moros in September, which he should keep to re-establish care and determine long-term plan for him, schedule colonoscopy, etc.  ZEHR, JESSICA D.  02/28/2016, 1:18 PM  Pager number [facility] Attending   I have taken an interval history, reviewed the chart and examined the patient. I agree with the Advanced Practitioner's note, impression and  recommendations.    Could have Crohn's exacerbation but ? Antibiotic side effects, other infection.  Will add cholestyramine once I know he is not vomiting as could have a component of bile-salt diarrhea.  Probiotic might be useful also.  Gatha Mayer, MD, [facility] [phone] (pager) [phone] after 5 PM, weekends and holidays  02/28/2016 9:26 PM

## 2016-02-29 DIAGNOSIS — K50119 Crohn's disease of large intestine with unspecified complications: Secondary | ICD-10-CM

## 2016-02-29 DIAGNOSIS — R197 Diarrhea, unspecified: Secondary | ICD-10-CM | POA: Diagnosis not present

## 2016-02-29 DIAGNOSIS — E43 Unspecified severe protein-calorie malnutrition: Secondary | ICD-10-CM | POA: Diagnosis not present

## 2016-02-29 DIAGNOSIS — J449 Chronic obstructive pulmonary disease, unspecified: Secondary | ICD-10-CM | POA: Diagnosis not present

## 2016-02-29 DIAGNOSIS — Z9049 Acquired absence of other specified parts of digestive tract: Secondary | ICD-10-CM | POA: Diagnosis not present

## 2016-02-29 DIAGNOSIS — K5090 Crohn's disease, unspecified, without complications: Secondary | ICD-10-CM | POA: Diagnosis not present

## 2016-02-29 LAB — FERRITIN: Ferritin: 85 ng/mL (ref 24–336)

## 2016-02-29 LAB — HIGH SENSITIVITY CRP: CRP HIGH SENSITIVITY: 3.41 mg/L — AB (ref 0.00–3.00)

## 2016-02-29 LAB — VITAMIN B12: Vitamin B-12: 156 pg/mL — ABNORMAL LOW (ref 180–914)

## 2016-02-29 MED ORDER — ALBUTEROL SULFATE HFA 108 (90 BASE) MCG/ACT IN AERS
2.0000 | INHALATION_SPRAY | Freq: Four times a day (QID) | RESPIRATORY_TRACT | Status: DC | PRN
  Filled 2016-02-29: qty 6.7

## 2016-02-29 MED ORDER — CYANOCOBALAMIN 1000 MCG/ML IJ SOLN
1000.0000 ug | Freq: Once | INTRAMUSCULAR | Status: AC
Start: 2016-02-29 — End: 2016-02-29
  Administered 2016-02-29: 1000 ug via INTRAMUSCULAR
  Filled 2016-02-29: qty 1

## 2016-02-29 MED ORDER — VITAMIN B-12 1000 MCG PO TABS: 1000.0000 ug | ORAL_TABLET | Freq: Every day | ORAL | 0 refills | Status: DC

## 2016-02-29 MED ORDER — ALBUTEROL SULFATE HFA 108 (90 BASE) MCG/ACT IN AERS
1.0000 | INHALATION_SPRAY | Freq: Four times a day (QID) | RESPIRATORY_TRACT | Status: DC | PRN
  Filled 2016-02-29: qty 6.7

## 2016-02-29 MED ORDER — ADULT MULTIVITAMIN W/MINERALS CH: 1.0000 | ORAL_TABLET | Freq: Every day | ORAL | 0 refills | Status: DC

## 2016-02-29 MED ORDER — ADULT MULTIVITAMIN W/MINERALS CH
1.0000 | ORAL_TABLET | Freq: Every day | ORAL | 0 refills | Status: DC
Start: 2016-02-29 — End: 2016-07-26

## 2016-02-29 MED ORDER — ALBUTEROL SULFATE (2.5 MG/3ML) 0.083% IN NEBU: 2.5000 mg | INHALATION_SOLUTION | Freq: Four times a day (QID) | RESPIRATORY_TRACT | Status: DC | PRN

## 2016-02-29 NOTE — Progress Notes (Signed)
Pt discharged home via wheelchair. Discharge instructions given and all questions answered.

## 2016-02-29 NOTE — Progress Notes (Signed)
Subjective: Today [name] states that he has been eating more and experiencing less nausea, abdominal pain and diarrhea. He request for his food to be less puree for today.Discussed the plan for discharge today  Objective:  Vital signs in last 24 hours: Vitals:   02/28/16 0807 02/28/16 1451 02/28/16 2227 02/29/16 0503  BP:  120/78 117/82 134/89  Pulse:  80 63 65  Resp:  16 18 18   Temp:  98 F (36.7 C) 98.1 F (36.7 C) 98.1 F (36.7 C)  TempSrc:  Oral Oral   SpO2: 96% 96% 96% 97%  Weight:      Height:       Physical Exam  Constitutional: He is oriented to person, place, and time. No distress.  Cachectic    Cardiovascular: Normal rate and regular rhythm.   No murmur heard. Pulmonary/Chest: Effort normal. No respiratory distress. He has wheezes.  Bilateral wheeze in upper and lower lung fields   Abdominal: Soft. He exhibits no distension. There is tenderness. There is no guarding.  Neurological: He is alert and oriented to person, place, and time.  Extremities: R arm IV   Labs: CBC:  Recent Labs Lab 02/27/16 1308  WBC 9.5  HGB 16.3  HCT 47.1  MCV 94.4  PLT 366   Metabolic Panel:  Recent Labs Lab 02/27/16 1308 02/28/16 0540  NA 131* 137  K 3.5 3.9  CL 101 109  CO2 21* 23  GLUCOSE 160* 111*  BUN 11 13  CREATININE 0.68 0.58*  CALCIUM 8.7* 8.0*  ALT 43  --   ALKPHOS 106  --   BILITOT 0.5  --   PROT 7.0  --   ALBUMIN 3.5  --   LIPASE 46  --   CRP 3.41  ESR 5 Vit B12 156    Medications:   Scheduled Medications: . buprenorphine  8 mg Sublingual Q8H  . enoxaparin (LOVENOX) injection  30 mg Subcutaneous QHS  . feeding supplement (ENSURE ENLIVE)  237 mL Oral TID BM  . gabapentin  600 mg Oral BID  . multivitamin with minerals  1 tablet Oral Daily  . nicotine  21 mg Transdermal Daily  . pantoprazole  40 mg Oral QHS  . predniSONE  60 mg Oral Q breakfast  . sertraline  50 mg Oral Daily  . tiotropium  18 mcg Inhalation Daily   PRN  Medications: acetaminophen **OR** acetaminophen, albuterol, ipratropium-albuterol, ondansetron (ZOFRAN) IV  Assessment/Plan: Pt is a 35 y.o. yo male with a PMHx of Chron's disease (s/p ileocolectomy with anastomosis) who was admitted on 02/27/2016 with symptoms of abdominal pain, nausea/vomiting, and diarrhea which was determined to be secondary to chron's flare. Interventions at this time will be focused on nourishment and determining the etiology of abdominal symptoms.   Principal Problem:   Crohn's disease with complication ([place]) Active Problems:   Asthma   Protein-calorie malnutrition, severe ([place])   Depression   Substance abuse   Diarrhea  Abdominal pain- unlikely that this is related to a chron's flair. He feels much improved today. Yesterday we ordered ferritin, vit B12 and folate to assess for absorptive function, will have those followed up with outpatient GI.   Severe malnutrition nutrition is on board, we are following their recommendations.   COPD questioned this diagnosis with his age but he was born 2 months premature and started smoking cigarettes at age 43.Will try to retrieve PFT test results which sound like they were tested during disability evaluation. Continue home albuterol, duonebs,  and spiriva.   will follow up alpha-1 antitrypsin antibody outpatient, this is a send away lab which has not resulted yet   Hep C pt reports he was tested through his PCP, states he tested negative for HIV. Records have been requested from his PCP Dr Alphonzo Grieve, none received yet   Substance abuse continue home subutex    Ria Clock may be fasting ketosis in the setting of decreased appetite.  Depression continue home Gabapentin and sertraline   Dispo: Anticipated discharge in approximately 2-4 day(s).   LOS: 0 days   Ledell Noss, MD 02/29/2016, 7:00 AM Pager: [phone]

## 2016-02-29 NOTE — Progress Notes (Signed)
Respiratory care note- Pt instructed on proper use of MDI with spacer.  Pt demonstrated proper technique with instruction.

## 2016-02-29 NOTE — Progress Notes (Signed)
          Daily Rounding Note  02/29/2016, 9:13 AM  LOS: 0 days   SUBJECTIVE:   Chief complaint: nausea, vomiting, diarrhea    No further n/v.  Tolerating solids and ate all of his AM meal.  Stools still liquid but a lot less watery. ~ 4 stools yesterday.    OBJECTIVE:         Vital signs in last 24 hours:    Temp:  [98 F (36.7 C)-98.1 F (36.7 C)] 98.1 F (36.7 C) (08/08 0503) Pulse Rate:  [63-80] 65 (08/08 0503) Resp:  [16-18] 18 (08/08 0503) BP: (117-134)/(78-89) 134/89 (08/08 0503) SpO2:  [96 %-97 %] 97 % (08/08 0503) Last BM Date: 02/28/16 Filed Weights   02/27/16 1301  Weight: 39 kg (86 lb)   General: malnourished, thin.  Looks generally unwell but not acutely ill.  Comfortable   Heart: RRR Chest: clear bil.   Abdomen: soft, NT, active BS, ND  Extremities: horizontal scars on left forearm c/w hx of "cutting" Neuro/Psych:  Alert, oriented x 3.  A bit anxious.  Cooperative, pleasant.    ASSESMENT:   *  2 weeks N/V, diarrhea, weight loss in pt with hx Crohn's and no IBD meds for years and recent Clinda/Flagyl for dental infection.  C diff is negative.  Sxs resolving, improving.   *  Hx Crohns dz.  Ileocecectomy 2007.  Currently on high dose Prednisone in case sxs are from Crohns.  ESR is 5, CRPis 3.4 ( > 3 is high)  *  Non healing dental extraction site, on abx PTA, now discontinued.   *  Substance abuse, heroin. Living at a substance abuse facility currently. On Subutex.   *  Apparent hx Hep C positive, per labs at the rehab facility.   *  Protein cal malnutrition.    PLAN   *  Stop prednisone, already done by Dr Rivet.    *  Can resident run down labs from a Dr Pavelock of [facility] in [place]?, it was there pt had Hep C test earlier this year.  [phone].  I left message.  I called [facility] and they have no record of hep C testing, so the clinic must use another lab. Also called listed primary care  NP, Mary Anne Placey NP, at the [facility].  She last saw the pt in 06/2015, his only visit, and no hepatitis test performed there.  May just want to obtain viral cell count.     Sarah Gribbin  02/29/2016, 9:13 AM Pager: [phone]  Agree with Ms. Gribbin's assessment and plan. ? If narcotic w/drawl part of problem.  E. , MD, FACG  

## 2016-02-29 NOTE — Care Management Note (Signed)
Case Management Note  Patient Details  Name: [name]. MRN: [id number] Date of Birth: Jan 14, 1981  Subjective/Objective:            Present with nausea,vomiting , diarrhea. Hx of Crohn's disease.        Action/Plan: Plan is to d/c to home today.  Expected Discharge Date:                  Expected Discharge Plan:  Home/Self Care  In-House Referral:     Discharge planning Services  CM Consult  Status of Service:  Completed, signed off  If discussed at [place] of Stay Meetings, dates discussed:    Additional Comments:  Sharin Mons, RN 02/29/2016, 12:32 PM

## 2016-02-29 NOTE — Discharge Instructions (Signed)
[name],   Please keep your appointment with Dr. Havery Moros at [facility]  in September, he will monitor your Crohn's disease.  Please take a daily multivitamin and B12 because sometimes the Crohn's can make it hard for your body to absorb what it needs.

## 2016-02-29 NOTE — Discharge Summary (Signed)
Name: [name] [facility]. MRN: [id number] DOB: Aug 17, 1980 36 y.o. PCP: Marliss Coots, NP  Date of Admission: 02/27/2016  1:49 PM Date of Discharge: 03/01/2016 Attending Physician: No att. providers found  Discharge Diagnosis: 2. Severe protein calorie malnutrition   Presented with diarrhea likely secondary to recent abx for tooth infection   Had lost 20 pounds over the course of 2 weeks  Decreased appetite resolved as abx were stopped and soft foods were introduced.   Found to have low B12 3. Chrons s/p iliocolectomy with anastamosis 2007  Empirically treated for chrons flair with solumedrol and prednisone however sx were likely unrelated  Diagnosed 2007, he is not on maintenance rx 4. COPD   premature, smoking since age 60, alpha 1 antitrypsin deficiency = 3 hits   had PFTs earlier this year however he needs close follow up with pulmonology   Principal Problem:   Crohn's disease with complication ([place]) Active Problems:   Asthma   Protein-calorie malnutrition, severe ([place])   Depression   Substance abuse   Diarrhea   Discharge Medications:   Medication List    STOP taking these medications   clindamycin 300 MG capsule Commonly known as:  CLEOCIN   HYDROcodone-acetaminophen 5-325 MG tablet Commonly known as:  NORCO/VICODIN   metroNIDAZOLE 500 MG tablet Commonly known as:  FLAGYL   naproxen 500 MG tablet Commonly known as:  NAPROSYN     TAKE these medications   albuterol 108 (90 Base) MCG/ACT inhaler Commonly known as:  PROVENTIL HFA;VENTOLIN HFA Inhale 2 puffs into the lungs every 6 (six) hours as needed for wheezing or shortness of breath.   gabapentin 300 MG capsule Commonly known as:  NEURONTIN Take 600 mg by mouth 2 (two) times daily.   ibuprofen 200 MG tablet Commonly known as:  ADVIL,MOTRIN Take 400-600 mg by mouth every 6 (six) hours as needed for moderate pain.   ipratropium-albuterol 0.5-2.5 (3) MG/3ML Soln Commonly known as:  DUONEB Take 3  mLs by nebulization 2 (two) times daily as needed (shortness of breath/ wheezing).   multivitamin with minerals Tabs tablet Take 1 tablet by mouth daily.   nicotine 21 mg/24hr patch Commonly known as:  NICODERM CQ - dosed in mg/24 hours Place 21 mg onto the skin daily.   promethazine 25 MG tablet Commonly known as:  PHENERGAN Take 25 mg by mouth 3 (three) times daily as needed for nausea or vomiting. What changed:  Another medication with the same name was removed. Continue taking this medication, and follow the directions you see here.   sertraline 50 MG tablet Commonly known as:  ZOLOFT Take 50 mg by mouth daily.   SUBOXONE 8-2 MG Film Generic drug:  Buprenorphine HCl-Naloxone HCl Place 1 Film under the tongue 3 (three) times daily.   tiotropium 18 MCG inhalation capsule Commonly known as:  SPIRIVA Place 18 mcg into inhaler and inhale daily.   vitamin B-12 1000 MCG tablet Commonly known as:  CYANOCOBALAMIN Take 1 tablet (1,000 mcg total) by mouth daily.       Disposition and follow-up:   [name]. was discharged from [facility] in Good condition.  At the hospital follow up visit please address:  2. Severe protein calorie malnutrition - check to see that his appetite has remained and he is tolerating meal prep with the pain of his recent tooth extraction. Check to see that  N/V/D have remained resolved   3. Chrons- consider starting a maintenance medication,  last colonoscopy 2007 so he is past due for repeat   4. COPD due to 3 hits- premature, smoking since age 54, alpha 1 antitrypsin deficiency, he is on duonebs, albuterol, and spiriva and has had PFTs however he needs close follow up with pulmonology   5. Hep C has not yet received treatment   2.  Labs / imaging needed at time of follow-up: colonoscopy  3.  Pending labs/ test needing follow-up:none   Follow-up Appointments: Follow-up Information    Manus Gunning, MD.  Go on 04/19/2016.   Specialty:  Gastroenterology Why:  please go to this office at 10 am  Contact information: [place] [id number] [phone]           Hospital Course by problem list: Principal Problem:   Crohn's disease with complication ([place]) Active Problems:   Asthma   Protein-calorie malnutrition, severe ([place])   Depression   Substance abuse   Diarrhea   [name] is a 35 year old male with MHx of Chrons disease, asthma, bipolar disorder and prior substance abuse who presented to the hospital for evaluation of several weeks of abdominal pain, nausea and vomiting. The patient reported that he had just "not been feeling well" for about 2 weeks. He stated he'd had fevers, chills, abdominal pain "all over" with some nausea, a few episodes of vomiting and "some" diarrhea. He reported the last time he had symptoms like this it was a Chron's flare several years ago. He also reported a decreased appetite with a 20lb weight loss over the past 2 weeks. The patient also reported that he was started on Clindamycin and Flagyl for a dental-related infection approximately 2 weeks ago and that he started feeling "bad" around this time. He reported he has dental surgery scheduled for next week and is on antibiotics until that time. He denied any dysuria, blood in his stool, frequent diarrhea, constipation, headache, productive cough, chest pain or increased shortness of breath.  1. Diarrhea  Was origionally thought to be related to a chron's flair. He was given loading dose of Solumedrol in ED and started on Prednisone. GI was consulted, they found ESR 5  and CRP 3.41 and found it unlikely that he was having a chrons flair. Cdiff negative however he had recently been started on Clindamycin and Flagyl. Abx were stopped and he reported significant relief of his anorexia and diarrhea.   2. Severe Protein Calorie Malnutrition Patient's BMI 14% and reports a 20lb weight loss  over 2 weeks. Patient started on Ensure supplementation, given a multivitamin and nutrition was consulted. They recommended a soft diet. Throughout the admission he had good intake of food. No anemia demonstrated on H&H. Vitamin B12 156 was given a B12 shot and discharged with B12 vitamin. Ferritin 85. Considered outpatient dexa however unclear how those results would change management because there is a lack of literature on dexa results for young males.   3. COPD  Alpha 1 trypsin 74, born 2 months premature, and he began smoking cigarettes at age 52 all leave him with a high predisposition for lung disease. He describes prior PFT testing says he "blew into a tube" during disability evaluation.Continued spiriva duo nebs and albuterol nebs. At discharge he mentioned that he had lost his home albuterol inhaler and the pharmacy would not provide him with more than 1 inhaler per month, he was supplied with a sample albuterol inhaler from the [facility] clinic. Multiple attempts to retreive records  from his PCP Dr. Alphonzo Grieve  (ph: [phone] fax: [phone]) and set up a follow up apt were not sucessful but I will continue to try and call the patient when an apt is scheduled. He should also be established with pulmonology   4. Chrons  The patient reported its been several years since he's seen a doctor regarding his Chron's disease but has an appointment with GI in September. Chrons was diagnosed around 2004, s/p ileocolectomy with anastomosis June 2007. Last colonoscopy was in June of 2007. Since then, he states his disease has been fairly quiet, with only a few flares, some resolving without tx, some requiring treatment  5. History of substance abuse Patient is chronically on suboxone as an outpatient. [facility] a [facility]. Pt reported last heroin use about 3 months ago. Pharmacy dosed subutex during admission.  6. Hepatitis C  Reports history of positive test through his PCP. He has not received  any treatmentt. Tested negative for HIV at that time.   7. Ria Clock may have been fasting ketosis in the setting of anorexia   8. Depression continued home Gabapentin and sertraline   Discharge Vitals:   BP 131/77   Pulse (!) 155   Temp 98.1 F (36.7 C)   Resp 18   Ht 5' 5" (1.651 m)   Wt 86 lb (39 kg)   SpO2 99%   BMI 14.31 kg/m   Pertinent Labs, Studies, and Procedures:  Alpha 1 trypsin  29  Vitamin B12 156 Ferritin 85   Discharge Instructions: Discharge Instructions    Call MD for:  persistant nausea and vomiting    Complete by:  As directed   Call MD for:  redness, tenderness, or signs of infection (pain, swelling, redness, odor or green/yellow discharge around incision site)    Complete by:  As directed   Call MD for:  temperature >100.4    Complete by:  As directed   Diet - low sodium heart healthy    Complete by:  As directed   Increase activity slowly    Complete by:  As directed      Signed: Ledell Noss, MD 03/01/2016, 8:40 PM   Pager: [phone]

## 2016-03-01 LAB — ALPHA-1-ANTITRYPSIN: A-1 Antitrypsin, Ser: 29 mg/dL — ABNORMAL LOW (ref 90–200)

## 2016-03-01 LAB — FOLATE RBC
FOLATE, HEMOLYSATE: 419.3 ng/mL
Folate, RBC: 1006 ng/mL (ref >498)
HEMATOCRIT: 41.7 % (ref 37.5–51.0)

## 2016-04-19 ENCOUNTER — Ambulatory Visit: Payer: Medicaid Other | Admitting: Gastroenterology

## 2016-06-13 ENCOUNTER — Emergency Department (HOSPITAL_COMMUNITY): Payer: Medicaid Other

## 2016-06-13 ENCOUNTER — Emergency Department (HOSPITAL_COMMUNITY)
Admission: EM | Admit: 2016-06-13 | Discharge: 2016-06-13 | Disposition: A | Discharge status: Home / Self Care | Payer: Medicaid Other | Attending: Emergency Medicine | Admitting: Emergency Medicine

## 2016-06-13 ENCOUNTER — Encounter (HOSPITAL_COMMUNITY): Payer: Self-pay

## 2016-06-13 DIAGNOSIS — J069 Acute upper respiratory infection, unspecified: Secondary | ICD-10-CM | POA: Diagnosis not present

## 2016-06-13 DIAGNOSIS — F909 Attention-deficit hyperactivity disorder, unspecified type: Secondary | ICD-10-CM | POA: Diagnosis not present

## 2016-06-13 DIAGNOSIS — F17210 Nicotine dependence, cigarettes, uncomplicated: Secondary | ICD-10-CM | POA: Insufficient documentation

## 2016-06-13 DIAGNOSIS — R05 Cough: Secondary | ICD-10-CM | POA: Diagnosis present

## 2016-06-13 DIAGNOSIS — J45909 Unspecified asthma, uncomplicated: Secondary | ICD-10-CM | POA: Insufficient documentation

## 2016-06-13 MED ORDER — ALBUTEROL SULFATE HFA 108 (90 BASE) MCG/ACT IN AERS
2.0000 | INHALATION_SPRAY | Freq: Once | RESPIRATORY_TRACT | Status: AC
  Administered 2016-06-13: 2 via RESPIRATORY_TRACT
  Filled 2016-06-13: qty 6.7

## 2016-06-13 MED ORDER — IPRATROPIUM-ALBUTEROL 0.5-2.5 (3) MG/3ML IN SOLN
3.0000 mL | Freq: Once | RESPIRATORY_TRACT | Status: AC
  Administered 2016-06-13: 3 mL via RESPIRATORY_TRACT
  Filled 2016-06-13: qty 3

## 2016-06-13 MED ORDER — ALBUTEROL SULFATE (2.5 MG/3ML) 0.083% IN NEBU
5.0000 mg | INHALATION_SOLUTION | Freq: Once | RESPIRATORY_TRACT | Status: AC
  Administered 2016-06-13: 5 mg via RESPIRATORY_TRACT

## 2016-06-13 MED ORDER — ALBUTEROL SULFATE (2.5 MG/3ML) 0.083% IN NEBU
INHALATION_SOLUTION | RESPIRATORY_TRACT | Status: AC
  Filled 2016-06-13: qty 3

## 2016-06-13 MED ORDER — AZITHROMYCIN 250 MG PO TABS: 250.0000 mg | ORAL_TABLET | Freq: Every day | ORAL | 0 refills | Status: DC

## 2016-06-13 MED ORDER — PROMETHAZINE-DM 6.25-15 MG/5ML PO SYRP: 2.5000 mL | ORAL_SOLUTION | Freq: Four times a day (QID) | ORAL | 0 refills | Status: DC | PRN

## 2016-06-13 MED ORDER — PREDNISONE 20 MG PO TABS: 40.0000 mg | ORAL_TABLET | Freq: Every day | ORAL | 0 refills | Status: DC

## 2016-06-13 MED ORDER — PREDNISONE 20 MG PO TABS
60.0000 mg | ORAL_TABLET | Freq: Once | ORAL | Status: AC
  Administered 2016-06-13: 60 mg via ORAL
  Filled 2016-06-13: qty 3

## 2016-06-13 NOTE — ED Provider Notes (Signed)
[facility] DEPT Provider Note   CSN: [id number] Arrival date & time: 06/13/16  1250     History   Chief Complaint Chief Complaint  Patient presents with  . Cough    HPI Clifford Yohn Emin Foree. is a 35 y.o. male.  HPI   Patient is a 35 year old male with history of Crohn's disease, bipolar disorder, substance abuse, CHF and diabetes who presents the ED with complaints of cough. Patient reports having worsening cough over the past week and a half with associated rhinorrhea, wheezing, shortness of breath and chest discomfort that is present with coughing. He reports having productive cough with cloudy green sputum. Endorses associated fever (temp 102) which resolved 3 days ago. He also reports having 2-3 episodes of posttussive emesis. He reports using his albuterol inhaler at home and taking Tylenol and ibuprofen for his fever, last dose yesterday. Denies headache, neck stiffness, hemoptysis, palpitations, abdominal pain, nausea. Patient reports his mother is also sick with similar symptoms. Reports history of pneumonia. Patient endorses smoking more than a pack of cigarettes per day. Pt reports he was admitted to the hospital on 02/29/16 for abdominal pain related to antibiotics he was on for dental infection. Denies use of any recent antibiotics. Reports he is out of his home inhaler.  Past Medical History:  Diagnosis Date  . Anxiety   . Asthma    mild   . Bipolar 1 disorder ([facility])    Self laceration and self harm behaviors   . Crohn disease ([place]) Dx 2004   terminal ileum Crohn's s/p ileocolectomy with anastamosis  . Depression   . Kidney stones   . Substance abuse     Patient Active Problem List   Diagnosis Date Noted  . Diarrhea   . Crohn's disease with complication ([place]) 49/70/2637  . Depression 02/27/2016  . Substance abuse 02/27/2016  . Influenza with pneumonia 10/07/2014  . Protein-calorie malnutrition, severe ([place]) 10/06/2014  . Sepsis ([place]) 10/05/2014  . CAP  (community acquired pneumonia) 10/05/2014  . Asthma 10/05/2014  . Generalized abdominal pain   . Hematemesis 09/04/2011  . ANEMIA 09/20/2009  . BIPOLAR DISORDER UNSPECIFIED 09/20/2009  . ADHD 09/20/2009  . 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 INTESTINAL OBSTRUCTION, HX OF 09/20/2009  . COCAINE ABUSE, HX OF 09/20/2009    Past Surgical History:  Procedure Laterality Date  . COLON SURGERY  01/17/2006  . ESOPHAGOGASTRODUODENOSCOPY  09/04/2011   Procedure: ESOPHAGOGASTRODUODENOSCOPY (EGD);  Surgeon: Estanislado Emms., MD,FACG;  Location: [facility];  Service: Endoscopy;  Laterality: N/A;  . INGUINAL HERNIA REPAIR     On the right and the left   . TONSILLECTOMY     20 yrs ago       Home Medications    Prior to Admission medications   Medication Sig Start Date End Date Taking? Authorizing Provider  albuterol (PROVENTIL HFA;VENTOLIN HFA) 108 (90 BASE) MCG/ACT inhaler Inhale 2 puffs into the lungs every 6 (six) hours as needed for wheezing or shortness of breath. 10/08/14  Yes Micheline Chapman, NP  Buprenorphine HCl-Naloxone HCl (SUBOXONE) 8-2 MG FILM Place 1 Film under the tongue 3 (three) times daily.   Yes Historical Provider, MD  gabapentin (NEURONTIN) 300 MG capsule Take 600 mg by mouth 2 (two) times daily. 02/02/16  Yes Historical Provider, MD  ibuprofen (ADVIL,MOTRIN) 200 MG tablet Take 400-600 mg by mouth every 6 (six) hours as needed for moderate pain.    Yes Historical Provider, MD  ipratropium-albuterol (DUONEB) 0.5-2.5 (3) MG/3ML SOLN  Take 3 mLs by nebulization 2 (two) times daily as needed (shortness of breath/ wheezing).   Yes Historical Provider, MD  Multiple Vitamin (MULTIVITAMIN WITH MINERALS) TABS tablet Take 1 tablet by mouth daily. 02/29/16  Yes Riccardo Dubin, MD  promethazine (PHENERGAN) 25 MG tablet Take 25 mg by mouth 3 (three) times daily as needed for nausea or vomiting.  02/22/16  Yes Historical Provider, MD  sertraline (ZOLOFT) 50 MG tablet Take 50 mg by mouth  daily.   Yes Historical Provider, MD  tiotropium (SPIRIVA) 18 MCG inhalation capsule Place 18 mcg into inhaler and inhale daily.   Yes Historical Provider, MD  vitamin B-12 (CYANOCOBALAMIN) 1000 MCG tablet Take 1 tablet (1,000 mcg total) by mouth daily. 02/29/16  Yes Riccardo Dubin, MD  azithromycin (ZITHROMAX) 250 MG tablet Take 1 tablet (250 mg total) by mouth daily. Take first 2 tablets together, then 1 every day until finished. 06/13/16   Nona Dell, PA-C  predniSONE (DELTASONE) 20 MG tablet Take 2 tablets (40 mg total) by mouth daily. 06/13/16   Nona Dell, PA-C  promethazine-dextromethorphan (PROMETHAZINE-DM) 6.25-15 MG/5ML syrup Take 2.5 mLs by mouth 4 (four) times daily as needed for cough. 06/13/16   Nona Dell, PA-C    Family History Family History  Problem Relation Age of Onset  . Hypertension Other   . Diabetes Other     Social History Social History  Substance Use Topics  . Smoking status: Current Every Day Smoker    Packs/day: 0.25    Types: Cigarettes  . Smokeless tobacco: Never Used  . Alcohol use Yes     Comment: occsionally     Allergies   Penicillins and Diphenhydramine hcl   Review of Systems Review of Systems  HENT: Positive for congestion and rhinorrhea.   Respiratory: Positive for cough, shortness of breath and wheezing.   Cardiovascular: Positive for chest pain (with coughing).  Gastrointestinal: Positive for vomiting (post-tussive).  All other systems reviewed and are negative.    Physical Exam Updated Vital Signs BP 105/72   Pulse 101   Temp 98.6 F (37 C) (Oral)   Resp 17   Ht 5' 6"  (1.676 m)   Wt 45.4 kg   SpO2 96%   BMI 16.14 kg/m   Physical Exam  Constitutional: He is oriented to person, place, and time. He appears well-developed and well-nourished. No distress.  HENT:  Head: Normocephalic and atraumatic.  Mouth/Throat: Uvula is midline, oropharynx is clear and moist and mucous membranes are  normal. No oropharyngeal exudate, posterior oropharyngeal edema, posterior oropharyngeal erythema or tonsillar abscesses. No tonsillar exudate.  Eyes: Conjunctivae and EOM are normal. Right eye exhibits no discharge. Left eye exhibits no discharge. No scleral icterus.  Neck: Normal range of motion. Neck supple.  Cardiovascular: Normal rate, regular rhythm, normal heart sounds and intact distal pulses.   Hr 98  Pulmonary/Chest: Effort normal. No respiratory distress. He has wheezes (mild diffuse wheezing bilaterally, worse in basilar lobes). He has no rales. He exhibits no tenderness.  No signs of respiratory distress or increased work of breathing.  Abdominal: Soft. Bowel sounds are normal. He exhibits no distension and no mass. There is no tenderness. There is no rebound and no guarding.  Musculoskeletal: Normal range of motion. He exhibits no edema.  Lymphadenopathy:    He has no cervical adenopathy.  Neurological: He is alert and oriented to person, place, and time.  Skin: Skin is warm and dry. He is not diaphoretic.  Nursing note and vitals reviewed.    ED Treatments / Results  Labs (all labs ordered are listed, but only abnormal results are displayed) Labs Reviewed - No data to display  EKG  EKG Interpretation None       Radiology Dg Chest 2 View  Result Date: 06/13/2016 CLINICAL DATA:  Shortness of breath, productive cough and RIGHT chest pain for 4 days. History of asthma/ COPD. EXAM: CHEST  2 VIEW COMPARISON:  Chest radiograph December 31, 2015 FINDINGS: Cardiomediastinal silhouette is normal. No pleural effusions or focal consolidations. Mild bronchitic changes and increased lung volumes with flattened hemidiaphragms. Trachea projects midline and there is no pneumothorax. Soft tissue planes and included osseous structures are non-suspicious. IMPRESSION: COPD. Electronically Signed   By: Elon Alas M.D.   On: 06/13/2016 13:39    Procedures Procedures (including critical  care time)  Medications Ordered in ED Medications  albuterol (PROVENTIL) (2.5 MG/3ML) 0.083% nebulizer solution (not administered)  albuterol (PROVENTIL) (2.5 MG/3ML) 0.083% nebulizer solution 5 mg (5 mg Nebulization Given 06/13/16 1310)  predniSONE (DELTASONE) tablet 60 mg (60 mg Oral Given 06/13/16 1507)  ipratropium-albuterol (DUONEB) 0.5-2.5 (3) MG/3ML nebulizer solution 3 mL (3 mLs Nebulization Given 06/13/16 1507)  albuterol (PROVENTIL HFA;VENTOLIN HFA) 108 (90 Base) MCG/ACT inhaler 2 puff (2 puffs Inhalation Given 06/13/16 1559)     Initial Impression / Assessment and Plan / ED Course  I have reviewed the triage vital signs and the nursing notes.  Pertinent labs & imaging results that were available during my care of the patient were reviewed by me and considered in my medical decision making (see chart for details).  Clinical Course     Patient presents with cough, wheezing, shortness of breath, nasal congestion and rhinorrhea for the past week and a half. His mother has also been sick with similar symptoms this week. Endorses associated fever which has resolved over the past 3 days. Reports history of pneumonia. VSS. Exam revealed mild diffuse wheezing bilaterally, worse in basilar lobes, no signs of respiratory distress or increased work of breathing. Remaining exam unremarkable. Patient given steroids and neb treatments in the ED. Chest x-ray with no acute findings. On reevaluation patient reports improvement of wheezing and shortness of breath. Reexamination revealed significant improvement of wheezing throughout. VSS, oxygen saturation 95% on RA. Suspect viral URI/bronchitis, however due to patient with concern for pneumonia and reported history of pneumonia, will d/c pt home with antibiotics. Plan to discharge patient home with albuterol inhaler, steroids and antitussive. Patient given information to follow up with PCP has had. Discussed return precautions.  Final Clinical  Impressions(s) / ED Diagnoses   Final diagnoses:  Upper respiratory tract infection, unspecified type    New Prescriptions Discharge Medication List as of 06/13/2016  3:43 PM    START taking these medications   Details  azithromycin (ZITHROMAX) 250 MG tablet Take 1 tablet (250 mg total) by mouth daily. Take first 2 tablets together, then 1 every day until finished., Starting Tue 06/13/2016, Print    predniSONE (DELTASONE) 20 MG tablet Take 2 tablets (40 mg total) by mouth daily., Starting Tue 06/13/2016, Print    promethazine-dextromethorphan (PROMETHAZINE-DM) 6.25-15 MG/5ML syrup Take 2.5 mLs by mouth 4 (four) times daily as needed for cough., Starting Tue 06/13/2016, Print         Nona Dell, PA-C 06/13/16 1600    Milton Ferguson, MD 06/15/16 [phone]

## 2016-06-13 NOTE — ED Triage Notes (Signed)
Per PT, Pt started to have congestions and productive cough that worsened on Saturday. Pt reports green sputum with cough and right rib pain. Reports fevers.

## 2016-06-13 NOTE — Discharge Instructions (Signed)
Take your medications as prescribed. I also recommend using albuterol inhaler as prescribed as needed for wheezing or shortness of breath. Continue drinking fluids at home to remain hydrated. I recommend refraining from smoking to prevent worsening of symptoms/illness. Please follow up with a primary care provider from the Resource Guide provided below in one week if your symptoms have not improved. Please return to the Emergency Department if symptoms worsen or new onset of fever, coughing up blood, difficulty breathing, chest pain, abdominal pain, vomiting, unable to keep fluids down.

## 2016-07-05 ENCOUNTER — Other Ambulatory Visit: Payer: Self-pay | Admitting: Internal Medicine

## 2016-07-05 ENCOUNTER — Ambulatory Visit: Payer: Medicaid Other | Attending: Internal Medicine | Admitting: Internal Medicine

## 2016-07-05 ENCOUNTER — Encounter: Payer: Self-pay | Admitting: Internal Medicine

## 2016-07-05 VITALS — BP 131/90 | HR 105 | Temp 98.3°F | Resp 15 | Wt 97.6 lb

## 2016-07-05 DIAGNOSIS — E43 Unspecified severe protein-calorie malnutrition: Secondary | ICD-10-CM | POA: Insufficient documentation

## 2016-07-05 DIAGNOSIS — B1920 Unspecified viral hepatitis C without hepatic coma: Secondary | ICD-10-CM | POA: Diagnosis not present

## 2016-07-05 DIAGNOSIS — J449 Chronic obstructive pulmonary disease, unspecified: Secondary | ICD-10-CM | POA: Insufficient documentation

## 2016-07-05 DIAGNOSIS — F5000 Anorexia nervosa, unspecified: Secondary | ICD-10-CM

## 2016-07-05 DIAGNOSIS — E538 Deficiency of other specified B group vitamins: Secondary | ICD-10-CM | POA: Insufficient documentation

## 2016-07-05 DIAGNOSIS — F419 Anxiety disorder, unspecified: Secondary | ICD-10-CM | POA: Diagnosis not present

## 2016-07-05 DIAGNOSIS — Z125 Encounter for screening for malignant neoplasm of prostate: Secondary | ICD-10-CM

## 2016-07-05 DIAGNOSIS — J45909 Unspecified asthma, uncomplicated: Secondary | ICD-10-CM

## 2016-07-05 DIAGNOSIS — K5010 Crohn's disease of large intestine without complications: Secondary | ICD-10-CM | POA: Insufficient documentation

## 2016-07-05 DIAGNOSIS — F339 Major depressive disorder, recurrent, unspecified: Secondary | ICD-10-CM | POA: Insufficient documentation

## 2016-07-05 DIAGNOSIS — Z23 Encounter for immunization: Secondary | ICD-10-CM | POA: Diagnosis not present

## 2016-07-05 DIAGNOSIS — Z1159 Encounter for screening for other viral diseases: Secondary | ICD-10-CM

## 2016-07-05 DIAGNOSIS — K50119 Crohn's disease of large intestine with unspecified complications: Secondary | ICD-10-CM | POA: Diagnosis not present

## 2016-07-05 DIAGNOSIS — Z9189 Other specified personal risk factors, not elsewhere classified: Secondary | ICD-10-CM

## 2016-07-05 DIAGNOSIS — F319 Bipolar disorder, unspecified: Secondary | ICD-10-CM | POA: Diagnosis not present

## 2016-07-05 DIAGNOSIS — Z13220 Encounter for screening for lipoid disorders: Secondary | ICD-10-CM

## 2016-07-05 DIAGNOSIS — F17210 Nicotine dependence, cigarettes, uncomplicated: Secondary | ICD-10-CM | POA: Insufficient documentation

## 2016-07-05 DIAGNOSIS — Z87442 Personal history of urinary calculi: Secondary | ICD-10-CM | POA: Insufficient documentation

## 2016-07-05 DIAGNOSIS — Z131 Encounter for screening for diabetes mellitus: Secondary | ICD-10-CM

## 2016-07-05 DIAGNOSIS — Z114 Encounter for screening for human immunodeficiency virus [HIV]: Secondary | ICD-10-CM

## 2016-07-05 LAB — CMP AND LIVER
ALBUMIN: 4.1 g/dL (ref 3.6–5.1)
ALK PHOS: 91 U/L (ref 40–115)
ALT: 46 U/L (ref 9–46)
AST: 42 U/L — AB (ref 10–40)
BILIRUBIN TOTAL: 0.3 mg/dL (ref 0.2–1.2)
BUN: 9 mg/dL (ref 7–25)
Bilirubin, Direct: 0.1 mg/dL (ref <=0.2)
CALCIUM: 9.7 mg/dL (ref 8.6–10.3)
CO2: 29 mmol/L (ref 20–31)
Chloride: 103 mmol/L (ref 98–110)
Creat: 0.92 mg/dL (ref 0.60–1.35)
GLUCOSE: 105 mg/dL — AB (ref 65–99)
Indirect Bilirubin: 0.2 mg/dL (ref 0.2–1.2)
POTASSIUM: 4.8 mmol/L (ref 3.5–5.3)
SODIUM: 139 mmol/L (ref 135–146)
TOTAL PROTEIN: 7 g/dL (ref 6.1–8.1)

## 2016-07-05 LAB — CBC WITH DIFFERENTIAL/PLATELET
BASOS ABS: 0 {cells}/uL (ref 0–200)
Basophils Relative: 0 %
Eosinophils Absolute: 455 cells/uL (ref 15–500)
Eosinophils Relative: 5 %
HEMATOCRIT: 47 % (ref 38.5–50.0)
HEMOGLOBIN: 15.8 g/dL (ref 13.2–17.1)
LYMPHS ABS: 2093 {cells}/uL (ref 850–3900)
Lymphocytes Relative: 23 %
MCH: 31.3 pg (ref 27.0–33.0)
MCHC: 33.6 g/dL (ref 32.0–36.0)
MCV: 93.3 fL (ref 80.0–100.0)
MONO ABS: 364 {cells}/uL (ref 200–950)
MPV: 9.1 fL (ref 7.5–12.5)
Monocytes Relative: 4 %
NEUTROS ABS: 6188 {cells}/uL (ref 1500–7800)
NEUTROS PCT: 68 %
Platelets: 293 10*3/uL (ref 140–400)
RBC: 5.04 MIL/uL (ref 4.20–5.80)
RDW: 13.6 % (ref 11.0–15.0)
WBC: 9.1 10*3/uL (ref 3.8–10.8)

## 2016-07-05 LAB — VITAMIN B12: Vitamin B-12: 316 pg/mL (ref 200–1100)

## 2016-07-05 LAB — LIPID PANEL
CHOL/HDL RATIO: 2.6 ratio (ref <5.0)
CHOLESTEROL: 152 mg/dL (ref <200)
HDL: 59 mg/dL (ref >40)
LDL Cholesterol: 76 mg/dL (ref <100)
TRIGLYCERIDES: 84 mg/dL (ref <150)
VLDL: 17 mg/dL (ref <30)

## 2016-07-05 LAB — TSH: TSH: 2.18 m[IU]/L (ref 0.40–4.50)

## 2016-07-05 LAB — PSA: PSA: 0.5 ng/mL (ref <=4.0)

## 2016-07-05 LAB — FOLATE: Folate: 16.7 ng/mL (ref >5.4)

## 2016-07-05 MED ORDER — CYPROHEPTADINE HCL 4 MG PO TABS: 4.0000 mg | ORAL_TABLET | Freq: Three times a day (TID) | ORAL | 0 refills | Status: DC | PRN

## 2016-07-05 MED ORDER — GABAPENTIN 300 MG PO CAPS: 600.0000 mg | ORAL_CAPSULE | Freq: Two times a day (BID) | ORAL | 2 refills | Status: DC

## 2016-07-05 MED ORDER — IPRATROPIUM-ALBUTEROL 0.5-2.5 (3) MG/3ML IN SOLN: 3.0000 mL | Freq: Four times a day (QID) | RESPIRATORY_TRACT | 3 refills | Status: DC | PRN

## 2016-07-05 MED ORDER — TIOTROPIUM BROMIDE MONOHYDRATE 18 MCG IN CAPS: 18.0000 ug | ORAL_CAPSULE | Freq: Every day | RESPIRATORY_TRACT | 3 refills | Status: DC

## 2016-07-05 MED ORDER — ALBUTEROL SULFATE HFA 108 (90 BASE) MCG/ACT IN AERS: 2.0000 | INHALATION_SPRAY | Freq: Four times a day (QID) | RESPIRATORY_TRACT | 11 refills | Status: DC | PRN

## 2016-07-05 MED ORDER — HYDROXYZINE HCL 50 MG PO TABS: 50.0000 mg | ORAL_TABLET | Freq: Three times a day (TID) | ORAL | 1 refills | Status: DC | PRN

## 2016-07-05 MED ORDER — FLUTICASONE-SALMETEROL 100-50 MCG/DOSE IN AEPB: 1.0000 | INHALATION_SPRAY | Freq: Two times a day (BID) | RESPIRATORY_TRACT | 3 refills | Status: DC

## 2016-07-05 MED ORDER — SERTRALINE HCL 50 MG PO TABS: 50.0000 mg | ORAL_TABLET | Freq: Every day | ORAL | 3 refills | Status: DC

## 2016-07-05 NOTE — Progress Notes (Signed)
dr 

## 2016-07-05 NOTE — Patient Instructions (Signed)
Influenza Virus Vaccine injection (Fluarix) What is this medicine? INFLUENZA VIRUS VACCINE (in floo EN zuh VAHY ruhs vak SEEN) helps to reduce the risk of getting influenza also known as the flu. This medicine may be used for other purposes; ask your health care provider or pharmacist if you have questions. COMMON BRAND NAME(S): Fluarix, Fluzone What should I tell my health care provider before I take this medicine? They need to know if you have any of these conditions: -bleeding disorder like hemophilia -fever or infection -Guillain-Barre syndrome or other neurological problems -immune system problems -infection with the human immunodeficiency virus (HIV) or AIDS -low blood platelet counts -multiple sclerosis -an unusual or allergic reaction to influenza virus vaccine, eggs, chicken proteins, latex, gentamicin, other medicines, foods, dyes or preservatives -pregnant or trying to get pregnant -breast-feeding How should I use this medicine? This vaccine is for injection into a muscle. It is given by a health care professional. A copy of Vaccine Information Statements will be given before each vaccination. Read this sheet carefully each time. The sheet may change frequently. Talk to your pediatrician regarding the use of this medicine in children. Special care may be needed. Overdosage: If you think you have taken too much of this medicine contact a poison control center or emergency room at once. NOTE: This medicine is only for you. Do not share this medicine with others. What if I miss a dose? This does not apply. What may interact with this medicine? -chemotherapy or radiation therapy -medicines that lower your immune system like etanercept, anakinra, infliximab, and adalimumab -medicines that treat or prevent blood clots like warfarin -phenytoin -steroid medicines like prednisone or cortisone -theophylline -vaccines This list may not describe all possible interactions. Give your  health care provider a list of all the medicines, herbs, non-prescription drugs, or dietary supplements you use. Also tell them if you smoke, drink alcohol, or use illegal drugs. Some items may interact with your medicine. What should I watch for while using this medicine? Report any side effects that do not go away within 3 days to your doctor or health care professional. Call your health care provider if any unusual symptoms occur within 6 weeks of receiving this vaccine. You may still catch the flu, but the illness is not usually as bad. You cannot get the flu from the vaccine. The vaccine will not protect against colds or other illnesses that may cause fever. The vaccine is needed every year. What side effects may I notice from receiving this medicine? Side effects that you should report to your doctor or health care professional as soon as possible: -allergic reactions like skin rash, itching or hives, swelling of the face, lips, or tongue Side effects that usually do not require medical attention (report to your doctor or health care professional if they continue or are bothersome): -fever -headache -muscle aches and pains -pain, tenderness, redness, or swelling at site where injected -weak or tired This list may not describe all possible side effects. Call your doctor for medical advice about side effects. You may report side effects to FDA at 1-800-FDA-1088. Where should I keep my medicine? This vaccine is only given in a clinic, pharmacy, doctor's office, or other health care setting and will not be stored at home. NOTE: This sheet is a summary. It may not cover all possible information. If you have questions about this medicine, talk to your doctor, pharmacist, or health care provider.  2017 Elsevier/Gold Standard (2008-02-05 09:30:40)

## 2016-07-05 NOTE — Progress Notes (Signed)
[name], is a 35 y.o. male  ESL:[id number]  YTR:[id number]  DOB - Dec 11, 1980  CC:  Chief Complaint  Patient presents with  . Follow-up       HPI: [name] is a 35 y.o. male here today to establish medical care.  Per pt, most of care he was receiving at [facility] til a few months ago, he is still getting his Subloxone w/ Dr Mirna Mires in that clinic currently, but switching care to our pcp.    Has very complicated PMhx lf polysubstance abuse/heroin/tob/marijuana, hx of "asthma and copd" per pt, but recent hospitalization records in 02/29/16 point towards copd, w/ tob hx, and alpha 1 antitrypsin def = 3 hits.  He also has signif hx of Crohns sp iliocolectomy w/ anastomosis 2007, severe protein cal malnutrition and suspected anorexia nervosa, untreated Hep C.  Pt states he is currently living at [facility], and trying to stay clean. He admits last use of heroin was end of November.  Currently smoking only 1/2 ppd, he use to smoke 1 ppd.  Still gets cravings for heroin, despite being on subloxone.  Neurontin helps a bit for the cravings as well.  Hx of MDD, but denies si/hi/avh. Admits to use to having problems w/ medication compliance, would abuse meds, but now is trying to take them as prescribed b/c wants to get better.  Use to see [facility] for his crohns.  Patient has No headache, No chest pain, No abdominal pain - No Nausea, No new weakness tingling or numbness, No Cough - SOB.    Review of Systems: Per hpi, o/w all systems reviewed and negative.    Allergies  Allergen Reactions  . Penicillins Anaphylaxis and Swelling    Has patient had a PCN reaction causing immediate rash, facial/tongue/throat swelling, SOB or lightheadedness with hypotension: Yes Has patient had a PCN reaction causing severe rash involving mucus membranes or skin necrosis: No Has patient had a PCN reaction that required hospitalization No Has patient had a PCN reaction  occurring within the last 10 years: No If all of the above answers are "NO", then may proceed with Cephalosporin use.   . Diphenhydramine Hcl Hives and Rash   Past Medical History:  Diagnosis Date  . Anxiety   . Asthma    mild   . Bipolar 1 disorder (HCC)    Self laceration and self harm behaviors   . Crohn disease ([place]) Dx 2004   terminal ileum Crohn's s/p ileocolectomy with anastamosis  . Depression   . Kidney stones   . Substance abuse    Current Outpatient Prescriptions on File Prior to Visit  Medication Sig Dispense Refill  . ibuprofen (ADVIL,MOTRIN) 200 MG tablet Take 400-600 mg by mouth every 6 (six) hours as needed for moderate pain.     [name] Kitchen azithromycin (ZITHROMAX) 250 MG tablet Take 1 tablet (250 mg total) by mouth daily. Take first 2 tablets together, then 1 every day until finished. (Patient not taking: Reported on 07/05/2016) 6 tablet 0  . Buprenorphine HCl-Naloxone HCl (SUBOXONE) 8-2 MG FILM Place 1 Film under the tongue 3 (three) times daily.    . Multiple Vitamin (MULTIVITAMIN WITH MINERALS) TABS tablet Take 1 tablet by mouth daily. (Patient not taking: Reported on 07/05/2016) 30 tablet 0  . predniSONE (DELTASONE) 20 MG tablet Take 2 tablets (40 mg total) by mouth daily. (Patient not taking: Reported on 07/05/2016) 10 tablet 0  . promethazine (PHENERGAN) 25 MG tablet Take 25 mg  by mouth 3 (three) times daily as needed for nausea or vomiting.   3  . promethazine-dextromethorphan (PROMETHAZINE-DM) 6.25-15 MG/5ML syrup Take 2.5 mLs by mouth 4 (four) times daily as needed for cough. (Patient not taking: Reported on 07/05/2016) 118 mL 0  . vitamin B-12 (CYANOCOBALAMIN) 1000 MCG tablet Take 1 tablet (1,000 mcg total) by mouth daily. (Patient not taking: Reported on 07/05/2016) 30 tablet 0   No current facility-administered medications on file prior to visit.    Family History  Problem Relation Age of Onset  . Hypertension Other   . Diabetes Other    Social History    Social History  . Marital status: Single    Spouse name: N/A  . Number of children: N/A  . Years of education: N/A   Occupational History  . Not on file.   Social History Main Topics  . Smoking status: Current Every Day Smoker    Packs/day: 0.25    Types: Cigarettes  . Smokeless tobacco: Never Used  . Alcohol use Yes     Comment: occsionally  . Drug use:     Frequency: 2.0 times per week    Types: Marijuana, Heroin     Comment: occasionally.   . Sexual activity: Yes    Birth control/ protection: None   Other Topics Concern  . Not on file   Social History Narrative   Lives at home with mother     Objective:   Vitals:   07/05/16 1058  BP: 131/90  Pulse: (!) 105  Resp: 15  Temp: 98.3 F (36.8 C)    Filed Weights   07/05/16 1058  Weight: 97 lb 9.6 oz (44.3 kg)    BP Readings from Last 3 Encounters:  07/05/16 131/90  06/13/16 105/72  02/29/16 131/77    Physical Exam: Constitutional: thin, cachetic, nervous appearing male. No distress. AAOx3 HENT: Normocephalic, atraumatic, External right and left ear normal. Oropharynx is clear and moist.  Eyes: Conjunctivae and EOM are normal. PERRL, no scleral icterus. Neck: Normal ROM. Neck supple. No JVD.  CVS: RRR, S1/S2 +, no murmurs, no gallops, no carotid bruit.  Pulmonary: diminished throughout, but speaking in full sentences, no stridor, no w/c/r noted,. Abdominal: Soft. BS +, no distension, tenderness, rebound or guarding.  Musculoskeletal: Normal range of motion. No edema and no tenderness.  Diffuse muscle atrophy throughout LE: bilat/ no c/c/e, pulses 2+ bilateral. Neuro: Alert.  muscle tone coordination wnl. No cranial nerve deficit grossly. Skin: Skin is warm and dry. No rash noted. Not diaphoretic. No erythema. No pallor. Psychiatric: Normal mood and affect. Behavior, judgment, thought content normal.  Lab Results  Component Value Date   WBC 9.5 02/27/2016   HGB 16.3 02/27/2016   HCT 41.7 02/29/2016    MCV 94.4 02/27/2016   PLT 309 02/27/2016   Lab Results  Component Value Date   CREATININE 0.58 (L) 02/28/2016   BUN 13 02/28/2016   NA 137 02/28/2016   K 3.9 02/28/2016   CL 109 02/28/2016   CO2 23 02/28/2016    Lab Results  Component Value Date   HGBA1C  07/28/2009    5.1 (NOTE) The ADA recommends the following therapeutic goal for glycemic control related to Hgb A1c measurement: Goal of therapy: <6.5 Hgb A1c  Reference: American Diabetes Association: Clinical Practice Recommendations 2010, Diabetes Care, 2010, 33: (Suppl  1).   Lipid Panel     Component Value Date/Time   CHOL  07/28/2009 2120    96  ATP III CLASSIFICATION:  <200     mg/dL   Desirable  200-239  mg/dL   Borderline High  >=240    mg/dL   High          TRIG 165 (H) 07/28/2009 2120   HDL 32 (L) 07/28/2009 2120   CHOLHDL 3.0 07/28/2009 2120   VLDL 33 07/28/2009 2120   [place]  07/28/2009 2120    31        Total Cholesterol/HDL:CHD Risk Coronary Heart Disease Risk Table                     Men   Women  1/2 Average Risk   3.4   3.3  Average Risk       5.0   4.4  2 X Average Risk   9.6   7.1  3 X Average Risk  23.4   11.0        Use the calculated Patient Ratio above and the CHD Risk Table to determine the patient's CHD Risk.        ATP III CLASSIFICATION (LDL):  <100     mg/dL   Optimal  100-129  mg/dL   Near or Above                    Optimal  130-159  mg/dL   Borderline  160-189  mg/dL   High  >190     mg/dL   Very High        No flowsheet data found.  Assessment and plan:   1. Mixed picture,  Asthma /COPD, but also findings of Alpha-1 antitrypsin def. - renewed duonebs ,  albuterol prn and spiriva - restarted Advair, pt states he was on it in past, but currently nolonger has. - amb ref Pulm - total tob cessation recd! - PF here  95 & 100, but in no acute distress, pt wants to go home and do his breathing treatments, which I think is reasonable. He is extremely cachetic,  suspect this may be close to his baseline. - no signs of acute exacerbation,   2. Crohn's disease of large intestine with complication ([place]) With severe protein cal malnutrition and hx of b12 def requiring b12 shots. - Ambulatory referral to Gastroenterology - Folate - CBC with Differential - CMP and Liver - chk b12  3.  Hx of polysubstance abuse, heroin, tob, marijuana - currently in drug rehab program [facility]/ [facility], - getting subl[facility]one at [facility] (in [place]) - Drug Screen, Urine - total cessation advised for all.  4. B12 deficiency - Vitamin B12  - will resume shots depending on levels  5. Protein-calorie malnutrition, severe ([place]) With suspected anorexia nervosa as well - amb ref psyche and GI pending at this time. - renewed his cyprheptadine 5m tid for now - case of protein shakes provided as well.  6. Recurrent major depressive disorder, remission status unspecified ([place] - Ambulatory referral to Psychiatry - cessation of drugs would help considerably - asked SW / Jasmine to also f/u w/ pt for support.  7. Diabetes mellitus screening - chk a1c/   8. Hx of hep c,  Encounter for hepatitis C virus screening test for high risk patient - Acute Hep Panel & Hep B Surface Ab - Hepatitis C antibody   9. Anorexia nervosa - chk tsh   10. Prostate cancer screening - PSA  11. Encounter for screening for HIV - HIV  antibody (with reflex) - Lipid Panel  12. Screening cholesterol level chk lipids  Return in about 4 weeks (around 08/02/2016).  The patient was given clear instructions to go to ER or return to medical center if symptoms don't improve, worsen or new problems develop. The patient verbalized understanding. The patient was told to call to get lab results if they haven't heard anything in the next week.    This note has been created with Surveyor, quantity. Any transcriptional errors  are unintentional.   Maren Reamer, MD, [place], [place]   07/05/2016, 12:20 PM

## 2016-07-06 LAB — ACUTE HEP PANEL AND HEP B SURFACE AB
HCV Ab: REACTIVE — AB
HEP A IGM: NONREACTIVE
HEP B S AG: NEGATIVE
Hep B C IgM: NONREACTIVE
Hep B S Ab: NEGATIVE

## 2016-07-06 LAB — HIV ANTIBODY (ROUTINE TESTING W REFLEX): HIV: NONREACTIVE

## 2016-07-06 NOTE — Telephone Encounter (Signed)
Would you want to change or start the pa process for it

## 2016-07-07 ENCOUNTER — Telehealth: Payer: Self-pay | Admitting: Licensed Clinical Social Worker

## 2016-07-07 NOTE — Telephone Encounter (Signed)
LCSWA contacted pt via telephone to follow up on behavioral health consult from PCP.   Pt reported that he has participated in a drug rehab program that provides sober living for seven months. He described the program as "pretty good" and plans to continue services with them.   Pt stated that he recently scheduled a Pulmonary appointment for January 3, 17 at 4:00 PM with  Pulmonary. He was contacted by [facility]; however, the earliest appointment was for March 7, 17. LCSWA provided pt with information on community resources that provide medication management and psychotherapy. Pt stated that he plans to contact [facility] and [facility] to inquire about earlier appointments. Pt utilizes public transportation and reported no other boundaries to participating in services.   Pt was encouraged to contact [facility] with any additional questions or concerns.

## 2016-07-07 NOTE — Telephone Encounter (Signed)
LCSWA received a return call from pt. Pt stated that he is currently in class and would like to speak to Hugo today after 3:00 pm. LCSWA agreed to call at that time.

## 2016-07-07 NOTE — Telephone Encounter (Signed)
LCSWA attempted to contact pt to follow up on behavioral health. LCSWA left a message for a return call.

## 2016-07-08 ENCOUNTER — Other Ambulatory Visit: Payer: Self-pay | Admitting: Internal Medicine

## 2016-07-08 MED ORDER — BUDESONIDE-FORMOTEROL FUMARATE 80-4.5 MCG/ACT IN AERO: 2.0000 | INHALATION_SPRAY | Freq: Two times a day (BID) | RESPIRATORY_TRACT | 3 refills | Status: DC

## 2016-07-08 NOTE — Telephone Encounter (Signed)
Attempting symbicort rather than advair due to prioraurth.  I see albuterol has 11 refills, I am not certain which Albuterol "blue inhaler" that patient is requesting 11 refills for.  He needs to bring in medicine for [place] to see. thanks

## 2016-07-10 ENCOUNTER — Other Ambulatory Visit: Payer: Self-pay | Admitting: Internal Medicine

## 2016-07-10 LAB — DRUG ABUSE PANEL 10-50, U
AMPHETAMINES (1000 NG/ML SCRN): NEGATIVE
BARBITURATES: NEGATIVE
BENZODIAZEPINES: NEGATIVE
COCAINE METABOLITES: POSITIVE — AB
MARIJUANA MET (50 ng/mL SCRN): POSITIVE — AB
METHADONE: NEGATIVE
METHAQUALONE: NEGATIVE
OPIATES: NEGATIVE
PHENCYCLIDINE: NEGATIVE
PROPOXYPHENE: NEGATIVE

## 2016-07-10 LAB — HEPATITIS C RNA QUANTITATIVE
HCV QUANT: 8591593 [IU]/mL — AB (ref <15)
HCV Quantitative Log: 6.93 {Log} — ABNORMAL HIGH (ref <1.18)

## 2016-07-10 MED ORDER — VITAMIN B-12 1000 MCG PO TABS: 1000.0000 ug | ORAL_TABLET | Freq: Every day | ORAL | 11 refills | Status: DC

## 2016-07-13 ENCOUNTER — Telehealth: Payer: Self-pay

## 2016-07-13 NOTE — Telephone Encounter (Signed)
Contacted pt to go over lab results pt home number was busy and the other number didn't have a vm set up will try patient again and will be sending letter out

## 2016-07-26 ENCOUNTER — Encounter: Payer: Self-pay | Admitting: Pulmonary Disease

## 2016-07-26 ENCOUNTER — Other Ambulatory Visit: Payer: Medicaid Other

## 2016-07-26 ENCOUNTER — Ambulatory Visit (INDEPENDENT_AMBULATORY_CARE_PROVIDER_SITE_OTHER): Payer: Medicaid Other | Admitting: Pulmonary Disease

## 2016-07-26 VITALS — BP 106/68 | HR 91 | Ht 65.0 in | Wt 94.4 lb

## 2016-07-26 DIAGNOSIS — R0600 Dyspnea, unspecified: Secondary | ICD-10-CM | POA: Diagnosis not present

## 2016-07-26 MED ORDER — FLUTICASONE-SALMETEROL 100-50 MCG/DOSE IN AEPB: 1.0000 | INHALATION_SPRAY | Freq: Two times a day (BID) | RESPIRATORY_TRACT | 0 refills | Status: DC

## 2016-07-26 MED ORDER — FLUTICASONE-SALMETEROL 100-50 MCG/DOSE IN AEPB: 1.0000 | INHALATION_SPRAY | Freq: Two times a day (BID) | RESPIRATORY_TRACT | 3 refills | Status: DC

## 2016-07-26 MED ORDER — ALBUTEROL SULFATE HFA 108 (90 BASE) MCG/ACT IN AERS: 2.0000 | INHALATION_SPRAY | Freq: Four times a day (QID) | RESPIRATORY_TRACT | 11 refills | Status: DC | PRN

## 2016-07-26 NOTE — Patient Instructions (Addendum)
For your COPD/asthma: We will check an alpha-1 antitrypsin genotype test today and call you with the results Keep taking Advair twice a day Keep taking Spiriva daily Use albuterol as needed, we will prescribe Ventolin specifically since you found that pro-air does not help We will check a simple spirometry test today We will check an ambulatory oximetry test  For your tobacco abuse: Strongly recommend that he stop smoking back and all other inhaled products right away Call 1-800-Quit-Now to get free nicotine replacement from the state of [place]  We will see back in about 4-6 weeks or sooner if needed

## 2016-07-26 NOTE — Progress Notes (Signed)
Subjective:    Patient ID: [name]., male    DOB: 1981-03-27, 36 y.o.   MRN: [id number]  HPI Chief Complaint  Patient presents with  . Advice Only    Referred by Dr. Janne Napoleon for tobacco abuse, asthma, copd.      Mr. [name] was sent to me for evaluation of his dyspnea in the setting of COPD.  His mother accompanies him and states that when he was a young child he "quit breathing" and was told that one of his lungs "was not functioning appropriately" and he was hospitalized frequently requiring an oxygen tent and other therapy.  She provides a significant amount of his history.  She says that his dyspena has been progressing over the last 4-5 years.  He has a lot of chest congestion and has to sit up in bed frequently due to dyspnea.  He was last hospitalized a year ago for pneumonia.  He has frequent flare ups of his COPD and has to take prednisone about 2-3 times a year.  The prednisone helps.  He has prescriptions for Advair and Spiriva.  He has not taken the Advair for a month due to insurance issues.  He takes albuterol twice a day, sometimes more.  He says ventolin helps more than ProAir.  He has not used a spacer with his inhaler.  He can't really tell if the Advair and Spiriva are helping much because of ongoing exacerbations.  They help a little he thinks.    He smokes 1/2 pack of cigarettes a day.  He previously smoked up to 2 packs per day for 25 years.  He smokes marijuana 1-2 times a week.  He smoked crack in the past, he has been clean for 2 months.    His grandfather died from COPD at age 31.  No other family members have lung problems.  He has been on oxygen only briefly, while in the hospital.   Of all his symptoms, he would like help with his dyspnea and chest congestion.    He worked in [employer] for ten years, has been on disability for five years for his lungs.   Past Medical History:  Diagnosis Date  . Anxiety   . Asthma    mild   .  Bipolar 1 disorder (HCC)    Self laceration and self harm behaviors   . Crohn disease ([place]) Dx 2004   terminal ileum Crohn's s/p ileocolectomy with anastamosis  . Depression   . Kidney stones   . Substance abuse      Family History  Problem Relation Age of Onset  . Hypertension Other   . Diabetes Other   . COPD Maternal Grandfather      Social History   Social History  . Marital status: Single    Spouse name: N/A  . Number of children: N/A  . Years of education: N/A   Occupational History  . Not on file.   Social History Main Topics  . Smoking status: Current Every Day Smoker    Packs/day: 0.50    Years: 25.00    Types: Cigarettes  . Smokeless tobacco: Never Used  . Alcohol use Yes     Comment: occsionally  . Drug use:     Frequency: 2.0 times per week    Types: Marijuana, Heroin     Comment: occasionally.   . Sexual activity: Yes    Birth control/ protection: None   Other Topics Concern  .  Not on file   Social History Narrative   Lives at home with mother      Allergies  Allergen Reactions  . Penicillins Anaphylaxis and Swelling    Has patient had a PCN reaction causing immediate rash, facial/tongue/throat swelling, SOB or lightheadedness with hypotension: Yes Has patient had a PCN reaction causing severe rash involving mucus membranes or skin necrosis: No Has patient had a PCN reaction that required hospitalization No Has patient had a PCN reaction occurring within the last 10 years: No If all of the above answers are "NO", then may proceed with Cephalosporin use.   . Diphenhydramine Hcl Hives and Rash     Outpatient Medications Prior to Visit  Medication Sig Dispense Refill  . albuterol (PROVENTIL HFA;VENTOLIN HFA) 108 (90 Base) MCG/ACT inhaler Inhale 2 puffs into the lungs every 6 (six) hours as needed for wheezing or shortness of breath. 1 Inhaler 11  . Buprenorphine HCl-Naloxone HCl (SUBOXONE) 8-2 MG FILM Place 1 Film under the tongue 3 (three)  times daily.    [name] Kitchen gabapentin (NEURONTIN) 300 MG capsule Take 2 capsules (600 mg total) by mouth 2 (two) times daily. 120 capsule 2  . hydrOXYzine (ATARAX/VISTARIL) 50 MG tablet Take 1 tablet (50 mg total) by mouth 3 (three) times daily as needed. 90 tablet 1  . ibuprofen (ADVIL,MOTRIN) 200 MG tablet Take 400-600 mg by mouth every 6 (six) hours as needed for moderate pain.     [name] Kitchen ipratropium-albuterol (DUONEB) 0.5-2.5 (3) MG/3ML SOLN Take 3 mLs by nebulization every 6 (six) hours as needed (shortness of breath/ wheezing). 360 mL 3  . promethazine (PHENERGAN) 25 MG tablet Take 25 mg by mouth 3 (three) times daily as needed for nausea or vomiting.   3  . sertraline (ZOLOFT) 50 MG tablet Take 1 tablet (50 mg total) by mouth daily. 30 tablet 3  . budesonide-formoterol (SYMBICORT) 80-4.5 MCG/ACT inhaler Inhale 2 puffs into the lungs 2 (two) times daily. (Patient not taking: Reported on 07/26/2016) 1 Inhaler 3  . cyproheptadine (PERIACTIN) 4 MG tablet Take 1 tablet (4 mg total) by mouth 3 (three) times daily as needed for allergies. (Patient not taking: Reported on 07/26/2016) 30 tablet 0  . Fluticasone-Salmeterol (ADVAIR) 100-50 MCG/DOSE AEPB Inhale 1 puff into the lungs 2 (two) times daily. (Patient not taking: Reported on 07/26/2016) 1 each 3  . tiotropium (SPIRIVA) 18 MCG inhalation capsule Place 1 capsule (18 mcg total) into inhaler and inhale daily. (Patient not taking: Reported on 07/26/2016) 30 capsule 3  . azithromycin (ZITHROMAX) 250 MG tablet Take 1 tablet (250 mg total) by mouth daily. Take first 2 tablets together, then 1 every day until finished. (Patient not taking: Reported on 07/26/2016) 6 tablet 0  . Multiple Vitamin (MULTIVITAMIN WITH MINERALS) TABS tablet Take 1 tablet by mouth daily. (Patient not taking: Reported on 07/26/2016) 30 tablet 0  . predniSONE (DELTASONE) 20 MG tablet Take 2 tablets (40 mg total) by mouth daily. (Patient not taking: Reported on 07/26/2016) 10 tablet 0  .  promethazine-dextromethorphan (PROMETHAZINE-DM) 6.25-15 MG/5ML syrup Take 2.5 mLs by mouth 4 (four) times daily as needed for cough. (Patient not taking: Reported on 07/26/2016) 118 mL 0  . vitamin B-12 (CYANOCOBALAMIN) 1000 MCG tablet Take 1 tablet (1,000 mcg total) by mouth daily. (Patient not taking: Reported on 07/26/2016) 30 tablet 11   No facility-administered medications prior to visit.       Review of Systems  Constitutional: Negative for fever and unexpected weight change.  HENT:  Positive for congestion. Negative for dental problem, ear pain, nosebleeds, postnasal drip, rhinorrhea, sinus pressure, sneezing, sore throat and trouble swallowing.   Eyes: Negative for redness and itching.  Respiratory: Positive for cough, shortness of breath and wheezing. Negative for chest tightness.   Cardiovascular: Negative for palpitations and leg swelling.  Gastrointestinal: Negative for nausea and vomiting.  Genitourinary: Negative for dysuria.  Musculoskeletal: Negative for joint swelling.  Skin: Negative for rash.  Neurological: Negative for headaches.  Hematological: Does not bruise/bleed easily.  Psychiatric/Behavioral: Negative for dysphoric mood. The patient is not nervous/anxious.        Objective:   Physical Exam Vitals:   07/26/16 1608  BP: 106/68  Pulse: 91  SpO2: 95%  Weight: 94 lb 6.4 oz (42.8 kg)  Height: 5' 5"  (1.651 m)  RA  Gen: chronically ill appearing, no acute distress HENT: NCAT, OP clear, no dentition, neck supple without masses Eyes: PERRL, EOMi Lymph: no cervical lymphadenopathy PULM: wheezing bilaterally, poor air movement CV: RRR, no mgr, no JVD GI: BS+, soft, nontender, no hsm Derm: no rash or skin breakdown MSK: normal bulk and tone Neuro: A&Ox4, CN II-XII intact, strength 5/5 in all 4 extremities Psyche: normal mood and affect   August 2017 alpha-1 antitrypsin low at 29, no Genotype sent  November 2017 chest x-ray images personally reviewed showing  hyperinflation    Assessment & Plan:   Impression: COPD/asthma Tobacco abuse Possible alpha-1 antitrypsin deficiency Marijuana abuse Prior crack cocaine use  Discussion: Is a pleasant 36 year old male who has significant symptoms from presumed COPD and asthma. Unfortunately I am unable to see record of prior spirometry testing so we need to repeat that today to confirm a diagnosis of COPD.  However, I think that is going to be likely that he has COPD as an alpha-1 antitrypsin test earlier this year was significantly low. We need to repeat this with a genotype to confirm the diagnosis. I explained to him today that this is major implications for him and he needs to quit smoking sooner rather than later as if he does in fact have this disease it means much more rapid progression of his symptoms.  Today he was counseled for greater than 3 minutes on the importance of quitting smoking. He would like to try to quit and he says that nicotine gum has worked in the past. Chantix was ineffective and cause significant mental side effects.  For your COPD/asthma: We will check an alpha-1 antitrypsin genotype test today and call you with the results Keep taking Advair twice a day Keep taking Spiriva daily Use albuterol as needed, we will prescribe Ventolin specifically since you found that pro-air does not help We will check a simple spirometry test today We will check an ambulatory oximetry test  For your tobacco abuse: Strongly recommend that he stop smoking back and all other inhaled products right away Call 1-800-Quit-Now to get free nicotine replacement from the state of [place]  We will see back in about 4-6 weeks or sooner if needed  Current Outpatient Prescriptions:  .  albuterol (PROVENTIL HFA;VENTOLIN HFA) 108 (90 Base) MCG/ACT inhaler, Inhale 2 puffs into the lungs every 6 (six) hours as needed for wheezing or shortness of breath., Disp: 1 Inhaler, Rfl: 11 .  Buprenorphine HCl-Naloxone HCl  (SUBOXONE) 8-2 MG FILM, Place 1 Film under the tongue 3 (three) times daily., Disp: , Rfl:  .  gabapentin (NEURONTIN) 300 MG capsule, Take 2 capsules (600 mg total) by mouth 2 (two) times  daily., Disp: 120 capsule, Rfl: 2 .  hydrOXYzine (ATARAX/VISTARIL) 50 MG tablet, Take 1 tablet (50 mg total) by mouth 3 (three) times daily as needed., Disp: 90 tablet, Rfl: 1 .  ibuprofen (ADVIL,MOTRIN) 200 MG tablet, Take 400-600 mg by mouth every 6 (six) hours as needed for moderate pain. , Disp: , Rfl:  .  ipratropium-albuterol (DUONEB) 0.5-2.5 (3) MG/3ML SOLN, Take 3 mLs by nebulization every 6 (six) hours as needed (shortness of breath/ wheezing)., Disp: 360 mL, Rfl: 3 .  promethazine (PHENERGAN) 25 MG tablet, Take 25 mg by mouth 3 (three) times daily as needed for nausea or vomiting. , Disp: , Rfl: 3 .  sertraline (ZOLOFT) 50 MG tablet, Take 1 tablet (50 mg total) by mouth daily., Disp: 30 tablet, Rfl: 3 .  budesonide-formoterol (SYMBICORT) 80-4.5 MCG/ACT inhaler, Inhale 2 puffs into the lungs 2 (two) times daily. (Patient not taking: Reported on 07/26/2016), Disp: 1 Inhaler, Rfl: 3 .  cyproheptadine (PERIACTIN) 4 MG tablet, Take 1 tablet (4 mg total) by mouth 3 (three) times daily as needed for allergies. (Patient not taking: Reported on 07/26/2016), Disp: 30 tablet, Rfl: 0 .  Fluticasone-Salmeterol (ADVAIR) 100-50 MCG/DOSE AEPB, Inhale 1 puff into the lungs 2 (two) times daily. (Patient not taking: Reported on 07/26/2016), Disp: 1 each, Rfl: 3 .  tiotropium (SPIRIVA) 18 MCG inhalation capsule, Place 1 capsule (18 mcg total) into inhaler and inhale daily. (Patient not taking: Reported on 07/26/2016), Disp: 30 capsule, Rfl: 3

## 2016-07-28 ENCOUNTER — Encounter: Payer: Self-pay | Admitting: Internal Medicine

## 2016-07-31 LAB — ALPHA-1 ANTITRYPSIN PHENOTYPE: A1 ANTITRYPSIN: 25 mg/dL — AB (ref 83–199)

## 2016-08-02 ENCOUNTER — Other Ambulatory Visit: Payer: Self-pay | Admitting: Internal Medicine

## 2016-08-03 ENCOUNTER — Encounter: Payer: Self-pay | Admitting: Pulmonary Disease

## 2016-08-03 DIAGNOSIS — E8801 Alpha-1-antitrypsin deficiency: Secondary | ICD-10-CM | POA: Insufficient documentation

## 2016-08-08 ENCOUNTER — Ambulatory Visit (INDEPENDENT_AMBULATORY_CARE_PROVIDER_SITE_OTHER): Payer: Medicaid Other | Admitting: Adult Health

## 2016-08-08 ENCOUNTER — Encounter: Payer: Self-pay | Admitting: Adult Health

## 2016-08-08 ENCOUNTER — Encounter: Payer: Self-pay | Admitting: *Deleted

## 2016-08-08 VITALS — BP 108/62 | HR 96 | Temp 98.2°F | Ht 65.0 in | Wt 98.0 lb

## 2016-08-08 DIAGNOSIS — K50919 Crohn's disease, unspecified, with unspecified complications: Secondary | ICD-10-CM

## 2016-08-08 DIAGNOSIS — B1920 Unspecified viral hepatitis C without hepatic coma: Secondary | ICD-10-CM

## 2016-08-08 DIAGNOSIS — J449 Chronic obstructive pulmonary disease, unspecified: Secondary | ICD-10-CM | POA: Diagnosis not present

## 2016-08-08 DIAGNOSIS — E8801 Alpha-1-antitrypsin deficiency: Secondary | ICD-10-CM | POA: Diagnosis not present

## 2016-08-08 MED ORDER — BUDESONIDE-FORMOTEROL FUMARATE 160-4.5 MCG/ACT IN AERO: 2.0000 | INHALATION_SPRAY | Freq: Two times a day (BID) | RESPIRATORY_TRACT | 0 refills | Status: DC

## 2016-08-08 MED ORDER — TIOTROPIUM BROMIDE MONOHYDRATE 1.25 MCG/ACT IN AERS: 2.0000 | INHALATION_SPRAY | Freq: Every day | RESPIRATORY_TRACT | 0 refills | Status: DC

## 2016-08-08 NOTE — Patient Instructions (Addendum)
Continue on Symbicort and Spiriva .  We will be in touch regarding Alpha 1 replacement .  MUST STOP SMOKING . No drug use .  Refer to GI .  Follow up with Dr. Lake Bells in 6-8 weeks and As needed   Please contact office for sooner follow up if symptoms do not improve or worsen or seek emergency care

## 2016-08-08 NOTE — Progress Notes (Signed)
@Patient  ID: [name]., male    DOB: January 22, 1981, 36 y.o.   MRN: [id number]  Chief Complaint  Patient presents with  . Follow-up    Alpha 1     Referring provider: Maren Reamer, MD  HPI: 36 yo male smoker seen for pulmonary consult 07/26/16 for COPD and Apha 1 Antitrypsin deficiency .  Premature birth. Crohn dz.  Hx of substance abuse, heroin/cocaine/thc use, HEP C    08/08/2016 Follow up : COPD /Alpha 1 Antitrypsin deficiency  Pt returns for 2 week follow up . Seen last ov for  COPD /Alpha 1 . He is an active heavy smoker , started at age 13. He was born 2 months premature. He was admitted in 02/2016 where alpha 1 level was low at 29. He was referred to Pulmonary . Alpha 1 was repeated w/ level at 25 , ZZ phenotype.  He is here to discuss replacement therapy .  He continues to smoke 1/2 -1 PPD.  Last cocaine  use was in Dec 2017. He goes to drug classes with twice weekly testing. He does admit to smoking THC recently  We discussed the need for complete cessation.  Mother is with him today .  Pt does get winded easily and has cough some days  He has been changed from Advair to Symbicort. Continues on Spiriva.  They are unsure what his new insurance/Medicaid will cover.   He does have hx of Crohns . Is suppose to see [facility] but does not have ov yet.  He has Hep C and is awaiting ID referral for treatment .     Allergies  Allergen Reactions  . Penicillins Anaphylaxis and Swelling    Has patient had a PCN reaction causing immediate rash, facial/tongue/throat swelling, SOB or lightheadedness with hypotension: Yes Has patient had a PCN reaction causing severe rash involving mucus membranes or skin necrosis: No Has patient had a PCN reaction that required hospitalization No Has patient had a PCN reaction occurring within the last 10 years: No If all of the above answers are "NO", then may proceed with Cephalosporin use.   . Diphenhydramine Hcl Hives and Rash     Immunization History  Administered Date(s) Administered  . Influenza,inj,Quad PF,36+ Mos 10/07/2014, 07/05/2016  . Pneumococcal Polysaccharide-23 10/07/2014  . Tdap 06/25/2013    Past Medical History:  Diagnosis Date  . Anxiety   . Asthma    mild   . Bipolar 1 disorder (HCC)    Self laceration and self harm behaviors   . Crohn disease ([place]) Dx 2004   terminal ileum Crohn's s/p ileocolectomy with anastamosis  . Depression   . Kidney stones   . Substance abuse     Tobacco History: History  Smoking Status  . Current Every Day Smoker  . Packs/day: 0.50  . Years: 25.00  . Types: Cigarettes  Smokeless Tobacco  . Never Used   Ready to quit: No Counseling given: Yes   Outpatient Encounter Prescriptions as of 08/08/2016  Medication Sig  . albuterol (PROVENTIL HFA;VENTOLIN HFA) 108 (90 Base) MCG/ACT inhaler Inhale 2 puffs into the lungs every 6 (six) hours as needed for wheezing or shortness of breath.  . Buprenorphine HCl-Naloxone HCl (SUBOXONE) 8-2 MG FILM Place 1 Film under the tongue 3 (three) times daily.  . Fluticasone-Salmeterol (ADVAIR DISKUS) 100-50 MCG/DOSE AEPB Inhale 1 puff into the lungs 2 (two) times daily.  [name] Kitchen gabapentin (NEURONTIN) 300 MG capsule Take 2 capsules (600 mg total) by  mouth 2 (two) times daily.  . hydrOXYzine (ATARAX/VISTARIL) 50 MG tablet Take 1 tablet (50 mg total) by mouth 3 (three) times daily as needed.  [name] Kitchen ibuprofen (ADVIL,MOTRIN) 200 MG tablet Take 400-600 mg by mouth every 6 (six) hours as needed for moderate pain.   [name] Kitchen ipratropium-albuterol (DUONEB) 0.5-2.5 (3) MG/3ML SOLN Take 3 mLs by nebulization every 6 (six) hours as needed (shortness of breath/ wheezing).  . promethazine (PHENERGAN) 25 MG tablet Take 25 mg by mouth 3 (three) times daily as needed for nausea or vomiting.   . sertraline (ZOLOFT) 50 MG tablet Take 1 tablet (50 mg total) by mouth daily.  . budesonide-formoterol (SYMBICORT) 160-4.5 MCG/ACT inhaler Inhale 2 puffs into the  lungs 2 (two) times daily.  . cyproheptadine (PERIACTIN) 4 MG tablet Take 1 tablet (4 mg total) by mouth 3 (three) times daily as needed for allergies. (Patient not taking: Reported on 08/08/2016)  . Tiotropium Bromide Monohydrate (SPIRIVA RESPIMAT) 1.25 MCG/ACT AERS Inhale 2 puffs into the lungs daily.  . [DISCONTINUED] budesonide-formoterol (SYMBICORT) 80-4.5 MCG/ACT inhaler Inhale 2 puffs into the lungs 2 (two) times daily. (Patient not taking: Reported on 08/08/2016)  . [DISCONTINUED] tiotropium (SPIRIVA) 18 MCG inhalation capsule Place 1 capsule (18 mcg total) into inhaler and inhale daily. (Patient not taking: Reported on 07/26/2016)   No facility-administered encounter medications on file as of 08/08/2016.      Review of Systems  Constitutional:   No  weight loss, night sweats,  Fevers, chills,  +fatigue, or  lassitude.  HEENT:   No headaches,  Difficulty swallowing,  Tooth/dental problems, or  Sore throat,                No sneezing, itching, ear ache,  +nasal congestion, post nasal drip,   CV:  No chest pain,  Orthopnea, PND, swelling in lower extremities, anasarca, dizziness, palpitations, syncope.   GI  No heartburn, indigestion, abdominal pain, nausea, vomiting, diarrhea, change in bowel habits, loss of appetite, bloody stools.   Resp:    No coughing up of blood.  No change in color of mucus.  No wheezing.  No chest wall deformity  Skin: no rash or lesions.  GU: no dysuria, change in color of urine, no urgency or frequency.  No flank pain, no hematuria   MS:  No joint pain or swelling.  No decreased range of motion.  No back pain.    Physical Exam  BP 108/62 (BP Location: Left Arm, Patient Position: Sitting)   Pulse 96   Temp 98.2 F (36.8 C) (Oral)   Ht 5' 5"  (1.651 m)   Wt 98 lb (44.5 kg)   SpO2 93%   BMI 16.31 kg/m   GEN: A/Ox3; pleasant , NAD, thin , older than stated.    HEENT:  [place]/AT,  EACs-clear, TMs-wnl, NOSE-clear, THROAT-clear, no lesions, no postnasal  drip or exudate noted.   NECK:  Supple w/ fair ROM; no JVD; normal carotid impulses w/o bruits; no thyromegaly or nodules palpated; no lymphadenopathy.    RESP  Decreased BS in bases ,  no accessory muscle use, no dullness to percussion  CARD:  RRR, no m/r/g, no peripheral edema, pulses intact, no cyanosis or clubbing.  GI:   Soft & nt; nml bowel sounds; no organomegaly or masses detected.   Musco: Warm bil, no deformities or joint swelling noted.   Neuro: alert, no focal deficits noted.    Skin: Warm, no lesions or rashes, tatoos, and piercing .   Psych:  No change in mood or affect. No depression or anxiety.  No memory loss.  Lab Results:  CBC    Component Value Date/Time   WBC 9.1 07/05/2016 1130   RBC 5.04 07/05/2016 1130   HGB 15.8 07/05/2016 1130   HCT 47.0 07/05/2016 1130   HCT 41.7 02/29/2016 0736   PLT 293 07/05/2016 1130   MCV 93.3 07/05/2016 1130   MCH 31.3 07/05/2016 1130   MCHC 33.6 07/05/2016 1130   RDW 13.6 07/05/2016 1130   LYMPHSABS 2,093 07/05/2016 1130   MONOABS 364 07/05/2016 1130   EOSABS 455 07/05/2016 1130   BASOSABS 0 07/05/2016 1130    BMET    Component Value Date/Time   NA 139 07/05/2016 1130   K 4.8 07/05/2016 1130   CL 103 07/05/2016 1130   CO2 29 07/05/2016 1130   GLUCOSE 105 (H) 07/05/2016 1130   BUN 9 07/05/2016 1130   CREATININE 0.92 07/05/2016 1130   CALCIUM 9.7 07/05/2016 1130   GFRNONAA >60 02/28/2016 0540   GFRAA >60 02/28/2016 0540    BNP No results found for: BNP  ProBNP No results found for: PROBNP  Imaging: No results found.   Assessment & Plan:   Alpha-1-antitrypsin deficiency ([place]) Alpha 1 antitrypsin deficiency - ZZ phenotype , level 25. Smoking cessation encouraged.  Will begin paperwork process for Prolastin C -paperwork completed.  Have advised on no illegal drug use.  Also will need to follow up with GI for liver issues alpha 1  And hx of Crohns  ID referral is pending for Hep C dx.   Plan Begin  Prolastin C paperwork/approval .  GI referall  Smoking cessaiton    COPD (chronic obstructive pulmonary disease) (HCC) Very Severe COPD (FEV1 26%) in active smoker with alpha 1 deficiency  Smoking cessation is key  Look at alpha 1 replacement   Plan  Cont on Symbicort /spiriva , may need to change depending on formulary  Smoking cessation       Rexene Edison, NP 08/08/2016

## 2016-08-08 NOTE — Assessment & Plan Note (Signed)
Refer to GI , along w/ alpha one as well  Also has HEP C , ID referral pending

## 2016-08-08 NOTE — Assessment & Plan Note (Signed)
Very Severe COPD (FEV1 26%) in active smoker with alpha 1 deficiency  Smoking cessation is key  Look at alpha 1 replacement   Plan  Cont on Symbicort /spiriva , may need to change depending on formulary  Smoking cessation

## 2016-08-08 NOTE — Assessment & Plan Note (Signed)
Alpha 1 antitrypsin deficiency - ZZ phenotype , level 25. Smoking cessation encouraged.  Will begin paperwork process for Prolastin C -paperwork completed.  Have advised on no illegal drug use.  Also will need to follow up with GI for liver issues alpha 1  And hx of Crohns  ID referral is pending for Hep C dx.   Plan Begin Prolastin C paperwork/approval .  GI referall  Smoking cessaiton

## 2016-08-10 NOTE — Progress Notes (Signed)
Reviewed, agree 

## 2016-08-15 ENCOUNTER — Ambulatory Visit: Payer: Medicaid Other | Admitting: Internal Medicine

## 2016-08-16 ENCOUNTER — Ambulatory Visit: Payer: Medicaid Other | Admitting: Physician Assistant

## 2016-08-28 ENCOUNTER — Ambulatory Visit: Payer: Medicaid Other | Admitting: Physician Assistant

## 2016-09-01 ENCOUNTER — Other Ambulatory Visit: Payer: Self-pay | Admitting: Internal Medicine

## 2016-09-06 ENCOUNTER — Emergency Department (HOSPITAL_COMMUNITY): Payer: Medicaid Other

## 2016-09-06 ENCOUNTER — Ambulatory Visit: Payer: Medicaid Other | Admitting: Physician Assistant

## 2016-09-06 ENCOUNTER — Encounter (HOSPITAL_COMMUNITY): Payer: Self-pay | Admitting: *Deleted

## 2016-09-06 ENCOUNTER — Emergency Department (HOSPITAL_COMMUNITY)
Admission: EM | Admit: 2016-09-06 | Discharge: 2016-09-06 | Disposition: A | Discharge status: Home / Self Care | Payer: Medicaid Other | Attending: Emergency Medicine | Admitting: Emergency Medicine

## 2016-09-06 DIAGNOSIS — Z79899 Other long term (current) drug therapy: Secondary | ICD-10-CM | POA: Diagnosis not present

## 2016-09-06 DIAGNOSIS — R0602 Shortness of breath: Secondary | ICD-10-CM | POA: Insufficient documentation

## 2016-09-06 DIAGNOSIS — F17210 Nicotine dependence, cigarettes, uncomplicated: Secondary | ICD-10-CM | POA: Diagnosis not present

## 2016-09-06 DIAGNOSIS — R109 Unspecified abdominal pain: Secondary | ICD-10-CM | POA: Diagnosis not present

## 2016-09-06 DIAGNOSIS — J449 Chronic obstructive pulmonary disease, unspecified: Secondary | ICD-10-CM | POA: Diagnosis not present

## 2016-09-06 LAB — CBC
HEMATOCRIT: 46 % (ref 39.0–52.0)
HEMOGLOBIN: 16.4 g/dL (ref 13.0–17.0)
MCH: 31.4 pg (ref 26.0–34.0)
MCHC: 35.7 g/dL (ref 30.0–36.0)
MCV: 88 fL (ref 78.0–100.0)
Platelets: 146 10*3/uL — ABNORMAL LOW (ref 150–400)
RBC: 5.23 MIL/uL (ref 4.22–5.81)
RDW: 12.8 % (ref 11.5–15.5)
WBC: 8.9 10*3/uL (ref 4.0–10.5)

## 2016-09-06 LAB — URINALYSIS, ROUTINE W REFLEX MICROSCOPIC
BACTERIA UA: NONE SEEN
BILIRUBIN URINE: NEGATIVE
Glucose, UA: NEGATIVE mg/dL
Hgb urine dipstick: NEGATIVE
KETONES UR: 5 mg/dL — AB
LEUKOCYTES UA: NEGATIVE
Nitrite: NEGATIVE
PH: 5 (ref 5.0–8.0)
Protein, ur: 30 mg/dL — AB
SPECIFIC GRAVITY, URINE: 1.028 (ref 1.005–1.030)
SQUAMOUS EPITHELIAL / LPF: NONE SEEN

## 2016-09-06 LAB — BASIC METABOLIC PANEL
ANION GAP: 10 (ref 5–15)
BUN: 14 mg/dL (ref 6–20)
CALCIUM: 9 mg/dL (ref 8.9–10.3)
CO2: 22 mmol/L (ref 22–32)
Chloride: 102 mmol/L (ref 101–111)
Creatinine, Ser: 0.68 mg/dL (ref 0.61–1.24)
GFR calc Af Amer: >60 mL/min (ref >60)
GLUCOSE: 157 mg/dL — AB (ref 65–99)
Potassium: 3.3 mmol/L — ABNORMAL LOW (ref 3.5–5.1)
Sodium: 134 mmol/L — ABNORMAL LOW (ref 135–145)

## 2016-09-06 MED ORDER — SODIUM CHLORIDE 0.9 % IV BOLUS (SEPSIS)
1000.0000 mL | Freq: Once | INTRAVENOUS | Status: AC
  Administered 2016-09-06: 1000 mL via INTRAVENOUS

## 2016-09-06 MED ORDER — KETOROLAC TROMETHAMINE 30 MG/ML IJ SOLN
30.0000 mg | Freq: Once | INTRAMUSCULAR | Status: AC
  Administered 2016-09-06: 30 mg via INTRAVENOUS
  Filled 2016-09-06: qty 1

## 2016-09-06 MED ORDER — OXYCODONE-ACETAMINOPHEN 5-325 MG PO TABS
1.0000 | ORAL_TABLET | Freq: Once | ORAL | Status: AC
  Administered 2016-09-06: 1 via ORAL
  Filled 2016-09-06: qty 1

## 2016-09-06 MED ORDER — MORPHINE SULFATE (PF) 4 MG/ML IV SOLN
4.0000 mg | Freq: Once | INTRAVENOUS | Status: AC
  Administered 2016-09-06: 4 mg via INTRAVENOUS
  Filled 2016-09-06: qty 1

## 2016-09-06 NOTE — ED Notes (Signed)
Pt ambulatory and independent at discharge.  Verbalized understanding of discharge instructions 

## 2016-09-06 NOTE — ED Notes (Signed)
Bed: YB74 Expected date:  Expected time:  Means of arrival:  Comments: EMS Flu symt

## 2016-09-06 NOTE — ED Triage Notes (Signed)
Per EMS pt from home with c/o fever, flank pain, urinary frequency and burning, cough, wheezing and shortness of breath. Pt was treated en route with 10 mg albuterol, 0.5 mg atrovent, 125 mg Solumedrol, 4 mg zofran

## 2016-09-06 NOTE — ED Notes (Signed)
Patient transported to X-ray 

## 2016-09-08 ENCOUNTER — Ambulatory Visit: Payer: Medicaid Other | Admitting: Pulmonary Disease

## 2016-09-08 NOTE — ED Provider Notes (Signed)
[facility] DEPT Provider Note   CSN: [id number] Arrival date & time: 09/06/16  0850     History   Chief Complaint Chief Complaint  Patient presents with  . Fever  . Flank Pain  . Shortness of Breath    HPI [name]. is a 36 y.o. male.  HPI Hx of alpha 1 antitrypsin deficiency presenting with flank pain and urinary frequency with a hx of kidney stones. Mild cough and wheezing and SOB. Received tx by EMS prior to my evaluation and feels slightly better at this time. No abdominal pain. No hx of recurrent UTIs. Denies vomiting. Reports some nausea improved by zofran for EMS. Low BP noted on arrival. IV hydration now     Past Medical History:  Diagnosis Date  . Anxiety   . Asthma    mild   . Bipolar 1 disorder (HCC)    Self laceration and self harm behaviors   . Crohn disease ([place]) Dx 2004   terminal ileum Crohn's s/p ileocolectomy with anastamosis  . Depression   . Kidney stones   . Substance abuse     Patient Active Problem List   Diagnosis Date Noted  . COPD (chronic obstructive pulmonary disease) ([place]) 08/08/2016  . Alpha-1-antitrypsin deficiency ([place]) 08/03/2016  . Diarrhea   . Crohn's disease with complication ([place]) 85/46/2703  . Depression 02/27/2016  . Substance abuse 02/27/2016  . Influenza with pneumonia 10/07/2014  . Protein-calorie malnutrition, severe ([place]) 10/06/2014  . Sepsis ([place]) 10/05/2014  . CAP (community acquired pneumonia) 10/05/2014  . Asthma 10/05/2014  . Generalized abdominal pain   . Hematemesis 09/04/2011  . ANEMIA 09/20/2009  . BIPOLAR DISORDER UNSPECIFIED 09/20/2009  . ADHD 09/20/2009  . 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 INTESTINAL OBSTRUCTION, HX OF 09/20/2009  . COCAINE ABUSE, HX OF 09/20/2009    Past Surgical History:  Procedure Laterality Date  . COLON SURGERY  01/17/2006  . ESOPHAGOGASTRODUODENOSCOPY  09/04/2011   Procedure: ESOPHAGOGASTRODUODENOSCOPY (EGD);  Surgeon: Estanislado Emms., MD,FACG;   Location: [facility];  Service: Endoscopy;  Laterality: N/A;  . INGUINAL HERNIA REPAIR     On the right and the left   . TONSILLECTOMY     20 yrs ago       Home Medications    Prior to Admission medications   Medication Sig Start Date End Date Taking? Authorizing Provider  albuterol (PROVENTIL HFA;VENTOLIN HFA) 108 (90 Base) MCG/ACT inhaler Inhale 2 puffs into the lungs every 6 (six) hours as needed for wheezing or shortness of breath. 07/26/16  Yes Juanito Doom, MD  budesonide-formoterol [facility]) 160-4.5 MCG/ACT inhaler Inhale 2 puffs into the lungs 2 (two) times daily. 08/08/16  Yes Tammy S Parrett, NP  Buprenorphine HCl-Naloxone HCl (SUBOXONE) 8-2 MG FILM Place 1 Film under the tongue 3 (three) times daily.   Yes Historical Provider, MD  Fluticasone-Salmeterol (ADVAIR DISKUS) 100-50 MCG/DOSE AEPB Inhale 1 puff into the lungs 2 (two) times daily. 07/26/16  Yes Juanito Doom, MD  gabapentin (NEURONTIN) 300 MG capsule Take 2 capsules (600 mg total) by mouth 2 (two) times daily. 09/01/16  Yes Maren Reamer, MD  hydrOXYzine (ATARAX/VISTARIL) 50 MG tablet Take 1 tablet (50 mg total) by mouth 3 (three) times daily as needed. 07/05/16  Yes Maren Reamer, MD  ibuprofen (ADVIL,MOTRIN) 200 MG tablet Take 400-600 mg by mouth every 6 (six) hours as needed for moderate pain.    Yes Historical Provider, MD  ipratropium-albuterol (DUONEB) 0.5-2.5 (3) MG/3ML SOLN  Take 3 mLs by nebulization every 6 (six) hours as needed (shortness of breath/ wheezing). 07/05/16  Yes Maren Reamer, MD  promethazine (PHENERGAN) 25 MG tablet Take 25 mg by mouth 3 (three) times daily as needed for nausea or vomiting.  02/22/16  Yes Historical Provider, MD  sertraline (ZOLOFT) 50 MG tablet Take 1 tablet (50 mg total) by mouth daily. 07/05/16  Yes Maren Reamer, MD  Tiotropium Bromide Monohydrate (SPIRIVA RESPIMAT) 1.25 MCG/ACT AERS Inhale 2 puffs into the lungs daily. 08/08/16  Yes Tammy S Parrett, NP    cyproheptadine (PERIACTIN) 4 MG tablet Take 1 tablet (4 mg total) by mouth 3 (three) times daily as needed for allergies. Patient not taking: Reported on 09/06/2016 07/05/16   Maren Reamer, MD    Family History Family History  Problem Relation Age of Onset  . Hypertension Other   . Diabetes Other   . COPD Maternal Grandfather     Social History Social History  Substance Use Topics  . Smoking status: Current Every Day Smoker    Packs/day: 0.50    Years: 25.00    Types: Cigarettes  . Smokeless tobacco: Never Used  . Alcohol use Yes     Comment: occsionally     Allergies   Penicillins and Diphenhydramine hcl   Review of Systems Review of Systems  All other systems reviewed and are negative.    Physical Exam Updated Vital Signs BP 122/67 (BP Location: Left Arm)   Pulse 101   Temp 98.3 F (36.8 C) (Oral)   Resp 17   Ht 5' 5"  (1.651 m)   Wt 100 lb (45.4 kg)   SpO2 99%   BMI 16.64 kg/m   Physical Exam  Constitutional: He is oriented to person, place, and time.  HENT:  Head: Normocephalic and atraumatic.  Eyes: EOM are normal.  Neck: Normal range of motion.  Cardiovascular: Normal rate, regular rhythm and intact distal pulses.   Pulmonary/Chest: Effort normal and breath sounds normal. No respiratory distress. He has no wheezes.  Abdominal: He exhibits no distension. There is no tenderness.  Musculoskeletal: Normal range of motion.  Neurological: He is alert and oriented to person, place, and time.  Skin: Skin is warm and dry.  Nursing note and vitals reviewed.    ED Treatments / Results  Labs (all labs ordered are listed, but only abnormal results are displayed) Labs Reviewed  CBC - Abnormal; Notable for the following:       Result Value   Platelets 146 (*)    All other components within normal limits  BASIC METABOLIC PANEL - Abnormal; Notable for the following:    Sodium 134 (*)    Potassium 3.3 (*)    Glucose, Bld 157 (*)    All other  components within normal limits  URINALYSIS, ROUTINE W REFLEX MICROSCOPIC - Abnormal; Notable for the following:    Color, Urine AMBER (*)    APPearance HAZY (*)    Ketones, ur 5 (*)    Protein, ur 30 (*)    All other components within normal limits    EKG  EKG Interpretation  Date/Time:  Wednesday September 06 2016 08:59:47 EST Ventricular Rate:  104 PR Interval:    QRS Duration: 93 QT Interval:  363 QTC Calculation: 478 R Axis:   71 Text Interpretation:  Sinus tachycardia Right atrial enlargement RSR' in V1 or V2, probably normal variant Borderline prolonged QT interval No significant change was found Confirmed by Taqwa Deem  MD,  Aliciana Ricciardi ([id number]) on 09/06/2016 9:46:52 AM       Radiology Dg Chest 2 View  Result Date: 09/06/2016 CLINICAL DATA:  Cough, congestion, sob, fever, wheezing worsening over last few days, smokes approx 1/2ppd EXAM: CHEST  2 VIEW COMPARISON:  06/13/2016 FINDINGS: Tapering of the peripheral pulmonary vasculature favors emphysema. Cardiac and mediastinal margins appear normal. The lungs appear otherwise clear. No pleural effusion. No significant bony abnormality observed. IMPRESSION: 1. Prominent emphysema. Electronically Signed   By: Van Clines M.D.   On: 09/06/2016 10:15   Ct Renal Stone Study  Result Date: 09/06/2016 CLINICAL DATA:  Fever, right flank pain, right abdominal pain EXAM: CT ABDOMEN AND PELVIS WITHOUT CONTRAST TECHNIQUE: Multidetector CT imaging of the abdomen and pelvis was performed following the standard protocol without IV contrast. COMPARISON:  02/27/2016 FINDINGS: Lower chest: Again noted extensive emphysematous changes lung bases. No acute findings. Hepatobiliary: Unenhanced liver shows no biliary ductal dilatation. The gallbladder is contracted without evidence of calcified gallstones. Pancreas: Unenhanced pancreas without focal abnormality. Spleen: Unenhanced spleen with normal appearance. Small accessory splenule is noted. Adrenals/Urinary  Tract: No adrenal gland mass. No nephrolithiasis. No hydronephrosis or hydroureter. Stable probable cyst in upper pole of the right kidney measures 1.1 cm. No calcified ureteral calculi. No calcified calculi are noted within under distended urinary bladder. Stomach/Bowel: No gastric outlet obstruction. No thickened or dilated small bowel loops. Again noted surgical anastomosis at the level of the cecum. No evidence of stricture or obstruction at this level. No pericolonic inflammation. Vascular/Lymphatic: No aortic aneurysm.  No adenopathy. Reproductive: Prostate gland and seminal vesicles are unremarkable. No pelvic mass is noted. Bilateral distal ureter is unremarkable. Stable right pelvic phlebolith. Other: No ascites or free abdominal air. Musculoskeletal: No destructive bony lesions are noted. Minimal disc space flattening at L5-S1 level. IMPRESSION: 1. There is no nephrolithiasis. No hydronephrosis or hydroureter. Stable cyst in upper pole of the right kidney. 2. No calcified ureteral calculi. No calcified calculi are noted within under distended urinary bladder. 3. Stable postsurgical changes with anastomosis at the level of the cecum without evidence of stricture or of obstruction. 4. No ascites or free abdominal air. Again noted extensive emphysematous changes lung bases. Electronically Signed   By: Lahoma Crocker M.D.   On: 09/06/2016 10:23     Procedures Procedures (including critical care time)  Medications Ordered in ED Medications  morphine 4 MG/ML injection 4 mg (4 mg Intravenous Given 09/06/16 1019)  ketorolac (TORADOL) 30 MG/ML injection 30 mg (30 mg Intravenous Given 09/06/16 1019)  oxyCODONE-acetaminophen (PERCOCET/ROXICET) 5-325 MG per tablet 1 tablet (1 tablet Oral Given 09/06/16 1557)  sodium chloride 0.9 % bolus 1,000 mL (0 mLs Intravenous Stopped 09/06/16 1811)     Initial Impression / Assessment and Plan / ED Course  I have reviewed the triage vital signs and the nursing  notes.  Pertinent labs & imaging results that were available during my care of the patient were reviewed by me and considered in my medical decision making (see chart for details).     BP improved with fluids. Feels better. Keeping oral fluids down at this time. cxr without acute abnormality  Final Clinical Impressions(s) / ED Diagnoses   Final diagnoses:  Right flank pain    New Prescriptions Discharge Medication List as of 09/06/2016  5:34 PM       Jola Schmidt, MD 09/08/16 1241

## 2016-09-12 ENCOUNTER — Ambulatory Visit (INDEPENDENT_AMBULATORY_CARE_PROVIDER_SITE_OTHER): Payer: Medicaid Other | Admitting: Pulmonary Disease

## 2016-09-12 ENCOUNTER — Encounter: Payer: Self-pay | Admitting: Pulmonary Disease

## 2016-09-12 DIAGNOSIS — J449 Chronic obstructive pulmonary disease, unspecified: Secondary | ICD-10-CM

## 2016-09-12 DIAGNOSIS — E8801 Alpha-1-antitrypsin deficiency: Secondary | ICD-10-CM | POA: Diagnosis not present

## 2016-09-12 MED ORDER — ALBUTEROL SULFATE HFA 108 (90 BASE) MCG/ACT IN AERS: 2.0000 | INHALATION_SPRAY | Freq: Four times a day (QID) | RESPIRATORY_TRACT | 11 refills | Status: DC | PRN

## 2016-09-12 MED ORDER — BUDESONIDE-FORMOTEROL FUMARATE 160-4.5 MCG/ACT IN AERO: 2.0000 | INHALATION_SPRAY | Freq: Two times a day (BID) | RESPIRATORY_TRACT | 0 refills | Status: DC

## 2016-09-12 NOTE — Assessment & Plan Note (Addendum)
Very severe disease, complicated case with ongoing tobacco abuse. There is some confusion with him today in regards to which controller medicines he should be taking as he brought in multiple medicines which she has accumulated from various doctors visits over the last few months.  Plan: Moving forward he will take Symbicort 2 puffs twice a day and Spiriva once daily, he was counseled on the significance of these being controller medicines and not rescue medicines He will use albuterol 2 puffs every 4-6 hours as needed for shortness of breath

## 2016-09-12 NOTE — Patient Instructions (Signed)
Mail in the application for the alpha-1 replacement medicine  Use albuterol 2 puffs every 4-6 hours as needed for shortness of breath Take Symbicort 2 puffs twice a day Take Spiriva 1 puff daily  Stop smoking  We will see you back in 6-8 weeks or sooner if needed

## 2016-09-12 NOTE — Progress Notes (Signed)
Subjective:    Patient ID: [name]., male    DOB: 1981/01/29, 36 y.o.   MRN: [id number]  Synopsis: Referred in January 2018 for COPD. Found to have alpha-1 antitrypsin deficiency. Still smoking as of February 2018.  HPI Chief Complaint  Patient presents with  . Follow-up    pt c/o waking up in panic attacks that cause sob, has been to hospital because of this.  states his breathing is managed by not exertion often.     [name] has been having trouble breathing in the mornings which he associates with panic attacks.  He says that the visteril helps, but when it wears off he feels more anxiety.   His breathing has been worse most days lately.  Sometimes it is hard to breathe when he bends over to pick up something or if he lay flat.  He says that he feels like a rib is broken on the left.  He has been using his h He needs albuterol.   He has not completed his alpha-1 anti-trypsin paperwork yet.    He has cut back on his cigarettes to 5-6 cigarettes a day on a good day, but he may smoke more when anxiety is worse.    Past Medical History:  Diagnosis Date  . Anxiety   . Asthma    mild   . Bipolar 1 disorder (HCC)    Self laceration and self harm behaviors   . Crohn disease ([place]) Dx 2004   terminal ileum Crohn's s/p ileocolectomy with anastamosis  . Depression   . Kidney stones   . Substance abuse       Review of Systems  Constitutional: Negative for chills, fatigue and fever.  HENT: Negative for postnasal drip, rhinorrhea and sinus pain.   Respiratory: Positive for cough, shortness of breath and wheezing.   Cardiovascular: Negative for chest pain, palpitations and leg swelling.       Objective:   Physical Exam Vitals:   09/12/16 1200  BP: 138/66  Pulse: (!) 115  SpO2: 95%  Weight: 96 lb (43.5 kg)  Height: 5' 5"  (1.651 m)   RA  Gen: chronically ill appearing HENT: OP clear, TM's clear, neck supple PULM: Wheezing bilaterally, normal air movememnt,  normal percussion CV: RRR, no mgr, trace edema GI: BS+, soft, nontender Derm: no cyanosis or rash Psyche: normal mood and affect  PFT: 07/2016 Ratio 38%, FEV1 0.96L 26% pred  Blood work: 07/2016 Alpha 1 level 25, ZZ phenotype     Assessment & Plan:  Alpha-1-antitrypsin deficiency ([place]) He has alpha-1 antitrypsin deficiency which is lead to very severe COPD. Unfortunately he continues to smoke cigarettes. I counseled him at length today on the importance of quitting smoking considering his severe phenotype.  Despite his ongoing tobacco use, I think it's worthwhile to at least pursue whether or not alpha-1 antitrypsin replacement is possible to help stabilize his disease. Clearly with continued tobacco use he is causing his condition to worsen, but if we have something that can help stabilize him in some fashion I think it's worth pursuing.  Will continue to attempt to get him alpha 1 treatment  COPD (chronic obstructive pulmonary disease) (HCC) Very severe disease, complicated case with ongoing tobacco abuse. There is some confusion with him today in regards to which controller medicines he should be taking as he brought in multiple medicines which she has accumulated from various doctors visits over the last few months.  Plan: Moving forward he will take  Symbicort 2 puffs twice a day and Spiriva once daily, he was counseled on the significance of these being controller medicines and not rescue medicines He will use albuterol 2 puffs every 4-6 hours as needed for shortness of breath    Current Outpatient Prescriptions:  .  albuterol (PROVENTIL HFA;VENTOLIN HFA) 108 (90 Base) MCG/ACT inhaler, Inhale 2 puffs into the lungs every 6 (six) hours as needed for wheezing or shortness of breath., Disp: 1 Inhaler, Rfl: 11 .  budesonide-formoterol (SYMBICORT) 160-4.5 MCG/ACT inhaler, Inhale 2 puffs into the lungs 2 (two) times daily., Disp: 1 Inhaler, Rfl: 0 .  Buprenorphine HCl-Naloxone HCl  (SUBOXONE) 8-2 MG FILM, Place 1 Film under the tongue 3 (three) times daily., Disp: , Rfl:  .  cyproheptadine (PERIACTIN) 4 MG tablet, Take 1 tablet (4 mg total) by mouth 3 (three) times daily as needed for allergies., Disp: 30 tablet, Rfl: 0 .  gabapentin (NEURONTIN) 300 MG capsule, Take 2 capsules (600 mg total) by mouth 2 (two) times daily., Disp: 120 capsule, Rfl: 2 .  hydrOXYzine (ATARAX/VISTARIL) 50 MG tablet, Take 1 tablet (50 mg total) by mouth 3 (three) times daily as needed., Disp: 90 tablet, Rfl: 1 .  ipratropium-albuterol (DUONEB) 0.5-2.5 (3) MG/3ML SOLN, Take 3 mLs by nebulization every 6 (six) hours as needed (shortness of breath/ wheezing)., Disp: 360 mL, Rfl: 3 .  promethazine (PHENERGAN) 25 MG tablet, Take 25 mg by mouth 3 (three) times daily as needed for nausea or vomiting. , Disp: , Rfl: 3 .  sertraline (ZOLOFT) 50 MG tablet, Take 1 tablet (50 mg total) by mouth daily., Disp: 30 tablet, Rfl: 3 .  Tiotropium Bromide Monohydrate (SPIRIVA RESPIMAT) 1.25 MCG/ACT AERS, Inhale 2 puffs into the lungs daily., Disp: 4 g, Rfl: 0

## 2016-09-12 NOTE — Assessment & Plan Note (Signed)
He has alpha-1 antitrypsin deficiency which is lead to very severe COPD. Unfortunately he continues to smoke cigarettes. I counseled him at length today on the importance of quitting smoking considering his severe phenotype.  Despite his ongoing tobacco use, I think it's worthwhile to at least pursue whether or not alpha-1 antitrypsin replacement is possible to help stabilize his disease. Clearly with continued tobacco use he is causing his condition to worsen, but if we have something that can help stabilize him in some fashion I think it's worth pursuing.  Will continue to attempt to get him alpha 1 treatment

## 2016-09-26 ENCOUNTER — Ambulatory Visit: Payer: Medicaid Other | Admitting: Internal Medicine

## 2016-09-27 ENCOUNTER — Telehealth: Payer: Self-pay | Admitting: Internal Medicine

## 2016-09-27 NOTE — Telephone Encounter (Signed)
Called pt. And left VM stating that his Lab results that were mailed were sent back to the facility. Results will be put in accordion.

## 2016-10-01 ENCOUNTER — Emergency Department (HOSPITAL_COMMUNITY)
Admission: EM | Admit: 2016-10-01 | Discharge: 2016-10-01 | Disposition: A | Discharge status: Home / Self Care | Payer: Medicaid Other | Attending: Emergency Medicine | Admitting: Emergency Medicine

## 2016-10-01 ENCOUNTER — Emergency Department (HOSPITAL_COMMUNITY): Payer: Medicaid Other

## 2016-10-01 DIAGNOSIS — W010XXA Fall on same level from slipping, tripping and stumbling without subsequent striking against object, initial encounter: Secondary | ICD-10-CM | POA: Diagnosis not present

## 2016-10-01 DIAGNOSIS — S7002XA Contusion of left hip, initial encounter: Secondary | ICD-10-CM | POA: Diagnosis not present

## 2016-10-01 DIAGNOSIS — Y939 Activity, unspecified: Secondary | ICD-10-CM | POA: Diagnosis not present

## 2016-10-01 DIAGNOSIS — Y92481 Parking lot as the place of occurrence of the external cause: Secondary | ICD-10-CM | POA: Insufficient documentation

## 2016-10-01 DIAGNOSIS — Y999 Unspecified external cause status: Secondary | ICD-10-CM | POA: Insufficient documentation

## 2016-10-01 DIAGNOSIS — J449 Chronic obstructive pulmonary disease, unspecified: Secondary | ICD-10-CM | POA: Insufficient documentation

## 2016-10-01 DIAGNOSIS — Z79899 Other long term (current) drug therapy: Secondary | ICD-10-CM | POA: Diagnosis not present

## 2016-10-01 DIAGNOSIS — S79912A Unspecified injury of left hip, initial encounter: Secondary | ICD-10-CM | POA: Diagnosis present

## 2016-10-01 DIAGNOSIS — F909 Attention-deficit hyperactivity disorder, unspecified type: Secondary | ICD-10-CM | POA: Insufficient documentation

## 2016-10-01 DIAGNOSIS — F17210 Nicotine dependence, cigarettes, uncomplicated: Secondary | ICD-10-CM | POA: Diagnosis not present

## 2016-10-01 MED ORDER — ALBUTEROL SULFATE (2.5 MG/3ML) 0.083% IN NEBU
5.0000 mg | INHALATION_SOLUTION | Freq: Once | RESPIRATORY_TRACT | Status: AC
  Administered 2016-10-01: 5 mg via RESPIRATORY_TRACT
  Filled 2016-10-01: qty 6

## 2016-10-01 MED ORDER — CLONAZEPAM 0.5 MG PO TABS
0.5000 mg | ORAL_TABLET | Freq: Once | ORAL | Status: AC
  Administered 2016-10-01: 0.5 mg via ORAL
  Filled 2016-10-01: qty 1

## 2016-10-01 MED ORDER — IBUPROFEN 200 MG PO TABS
400.0000 mg | ORAL_TABLET | Freq: Once | ORAL | Status: AC
  Administered 2016-10-01: 400 mg via ORAL
  Filled 2016-10-01: qty 2

## 2016-10-01 NOTE — Discharge Instructions (Signed)
It was our pleasure to provide your ER care today - we hope that you feel better.  Your xrays looks good/normal.   Take tylenol/advil as need.  Follow up with primary care doctor in 1 week if symptoms fail to improve/resolve.  You were given medication in the ER - no driving for the next 6 hours.

## 2016-10-01 NOTE — ED Provider Notes (Signed)
[facility] DEPT Provider Note   CSN: [id number] Arrival date & time: 10/01/16  1011     History   Chief Complaint Chief Complaint  Patient presents with  . Hip Pain    HPI Clifford Tout Dinh Ayotte. is a 36 y.o. male.  Patient indicates he slipped on parking lot this AM, falling onto left hip.  No faintness or dizziness. No head injury or loc with fall. No headache. No neck or back pain. Pt c/o left hip pain, moderate, persistent, worse w walking/movement/palpation. Is able to stand/walk. No numbness/weakness. Denies other pain or injury. Skin is intact.    The history is provided by the patient.  Hip Pain  Pertinent negatives include no chest pain, no abdominal pain and no headaches.    Past Medical History:  Diagnosis Date  . Anxiety   . Asthma    mild   . Bipolar 1 disorder (HCC)    Self laceration and self harm behaviors   . Crohn disease ([place]) Dx 2004   terminal ileum Crohn's s/p ileocolectomy with anastamosis  . Depression   . Kidney stones   . Substance abuse     Patient Active Problem List   Diagnosis Date Noted  . COPD (chronic obstructive pulmonary disease) ([place]) 08/08/2016  . Alpha-1-antitrypsin deficiency ([place]) 08/03/2016  . Diarrhea   . Crohn's disease with complication ([place]) 38/25/0539  . Depression 02/27/2016  . Substance abuse 02/27/2016  . Influenza with pneumonia 10/07/2014  . Protein-calorie malnutrition, severe ([place]) 10/06/2014  . Sepsis ([place]) 10/05/2014  . CAP (community acquired pneumonia) 10/05/2014  . Asthma 10/05/2014  . Generalized abdominal pain   . Hematemesis 09/04/2011  . ANEMIA 09/20/2009  . BIPOLAR DISORDER UNSPECIFIED 09/20/2009  . ADHD 09/20/2009  . 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 INTESTINAL OBSTRUCTION, HX OF 09/20/2009  . COCAINE ABUSE, HX OF 09/20/2009    Past Surgical History:  Procedure Laterality Date  . COLON SURGERY  01/17/2006  . ESOPHAGOGASTRODUODENOSCOPY  09/04/2011   Procedure:  ESOPHAGOGASTRODUODENOSCOPY (EGD);  Surgeon: Estanislado Emms., MD,FACG;  Location: [facility];  Service: Endoscopy;  Laterality: N/A;  . INGUINAL HERNIA REPAIR     On the right and the left   . TONSILLECTOMY     20 yrs ago       Home Medications    Prior to Admission medications   Medication Sig Start Date End Date Taking? Authorizing Provider  albuterol (PROVENTIL HFA;VENTOLIN HFA) 108 (90 Base) MCG/ACT inhaler Inhale 2 puffs into the lungs every 6 (six) hours as needed for wheezing or shortness of breath. 09/12/16   Juanito Doom, MD  budesonide-formoterol [facility]) 160-4.5 MCG/ACT inhaler Inhale 2 puffs into the lungs 2 (two) times daily. 09/12/16   Juanito Doom, MD  Buprenorphine HCl-Naloxone HCl (SUBOXONE) 8-2 MG FILM Place 1 Film under the tongue 3 (three) times daily.    Historical Provider, MD  cyproheptadine (PERIACTIN) 4 MG tablet Take 1 tablet (4 mg total) by mouth 3 (three) times daily as needed for allergies. 07/05/16   Maren Reamer, MD  gabapentin (NEURONTIN) 300 MG capsule Take 2 capsules (600 mg total) by mouth 2 (two) times daily. 09/01/16   Maren Reamer, MD  hydrOXYzine (ATARAX/VISTARIL) 50 MG tablet Take 1 tablet (50 mg total) by mouth 3 (three) times daily as needed. 07/05/16   Maren Reamer, MD  ipratropium-albuterol (DUONEB) 0.5-2.5 (3) MG/3ML SOLN Take 3 mLs by nebulization every 6 (six) hours as needed (shortness of breath/ wheezing). 07/05/16  Maren Reamer, MD  promethazine (PHENERGAN) 25 MG tablet Take 25 mg by mouth 3 (three) times daily as needed for nausea or vomiting.  02/22/16   Historical Provider, MD  sertraline (ZOLOFT) 50 MG tablet Take 1 tablet (50 mg total) by mouth daily. 07/05/16   Maren Reamer, MD  Tiotropium Bromide Monohydrate (SPIRIVA RESPIMAT) 1.25 MCG/ACT AERS Inhale 2 puffs into the lungs daily. 08/08/16   Melvenia Needles, NP    Family History Family History  Problem Relation Age of Onset  . Hypertension Other   .  Diabetes Other   . COPD Maternal Grandfather     Social History Social History  Substance Use Topics  . Smoking status: Current Every Day Smoker    Packs/day: 0.50    Years: 25.00    Types: Cigarettes  . Smokeless tobacco: Never Used  . Alcohol use Yes     Comment: occsionally     Allergies   Penicillins and Diphenhydramine hcl   Review of Systems Review of Systems  Constitutional: Negative for fever.  Cardiovascular: Negative for chest pain.  Gastrointestinal: Negative for abdominal pain.  Musculoskeletal: Negative for back pain and neck pain.  Skin: Negative for wound.  Neurological: Negative for weakness, numbness and headaches.     Physical Exam Updated Vital Signs BP 118/91 (BP Location: Left Arm)   Pulse 107   Temp 98.8 F (37.1 C) (Oral)   Resp 20   SpO2 92%   Physical Exam  Constitutional: He is oriented to person, place, and time. He appears well-developed and well-nourished. No distress.  HENT:  Head: Normocephalic and atraumatic.  Mouth/Throat: Oropharynx is clear and moist.  Eyes: Conjunctivae are normal.  Neck: Neck supple. No tracheal deviation present.  Cardiovascular: Normal rate and intact distal pulses.   Pulmonary/Chest: Effort normal. No accessory muscle usage. No respiratory distress. He exhibits no tenderness.  Abdominal: He exhibits no distension. There is no tenderness.  Musculoskeletal: He exhibits no edema.  Tenderness left hip. Distal pulses palp.  CTLS spine, non tender, aligned, no step off.   Neurological: He is alert and oriented to person, place, and time.  Motor intact bil, stre 5/5. Ambulates w steady gait. sens grossly intact.   Skin: Skin is warm and dry. He is not diaphoretic.  Psychiatric: He has a normal mood and affect.  Nursing note and vitals reviewed.    ED Treatments / Results  Labs (all labs ordered are listed, but only abnormal results are displayed) Labs Reviewed - No data to display  EKG  EKG  Interpretation None       Radiology Dg Hip Unilat W Or W/o Pelvis 2-3 Views Left  Result Date: 10/01/2016 CLINICAL DATA:  Acute left hip pain following fall today. Initial encounter. History of multiple myeloma and sickle cell disease. EXAM: DG HIP (WITH OR WITHOUT PELVIS) 2-3V LEFT COMPARISON:  Prior abdominal and pelvic CTs. FINDINGS: There is no evidence of hip fracture or dislocation. There is no evidence of arthropathy or other focal bone abnormality. IMPRESSION: Negative. Electronically Signed   By: Margarette Canada M.D.   On: 10/01/2016 11:09    Procedures Procedures (including critical care time)  Medications Ordered in ED Medications  clonazePAM (KLONOPIN) tablet 0.5 mg (0.5 mg Oral Given 10/01/16 1133)  ibuprofen (ADVIL,MOTRIN) tablet 400 mg (400 mg Oral Given 10/01/16 1132)     Initial Impression / Assessment and Plan / ED Course  I have reviewed the triage vital signs and the nursing notes.  Pertinent labs & imaging results that were available during my care of the patient were reviewed by me and considered in my medical decision making (see chart for details).  Pt requests a dose of his klonopin in ED, states he doesn't have it with him.  Pt states he did not drive here, and does not have to drive. Klonopin po. Motrin po.  Xrays.  xrays neg.   Pt ambulatory.   Pt appears stable for d/c.     Final Clinical Impressions(s) / ED Diagnoses   Final diagnoses:  Contusion of left hip, initial encounter  Fall from slip, trip, or stumble, initial encounter    New Prescriptions New Prescriptions   No medications on file     Lajean Saver, MD 10/01/16 1144

## 2016-10-01 NOTE — ED Notes (Signed)
Bed: WTR7 Expected date:  Expected time:  Means of arrival:  Comments: EMS hip pain, ambulatory

## 2016-10-01 NOTE — ED Triage Notes (Signed)
Patient fell in a parking lot this morning.  No dizziness or loss of conscience. Patient now c/o left hip pain.  No shortness or deformity noted.  Able to ambulate on his own.

## 2016-10-04 ENCOUNTER — Encounter: Payer: Self-pay | Admitting: Adult Health

## 2016-10-05 ENCOUNTER — Other Ambulatory Visit (HOSPITAL_COMMUNITY): Payer: Self-pay

## 2016-10-06 ENCOUNTER — Encounter (HOSPITAL_COMMUNITY)
Admission: RE | Admit: 2016-10-06 | Discharge: 2016-10-06 | Disposition: A | Discharge status: Home / Self Care | Payer: Medicaid Other | Source: Ambulatory Visit | Attending: Adult Health | Admitting: Adult Health

## 2016-10-06 DIAGNOSIS — E8801 Alpha-1-antitrypsin deficiency: Secondary | ICD-10-CM | POA: Diagnosis present

## 2016-10-06 MED ORDER — ALPHA1-PROTEINASE INHIBITOR 1000 MG IV SOLR: 60.0000 mg/kg | INTRAVENOUS | Status: DC

## 2016-10-06 MED ORDER — ALPHA1-PROTEINASE INHIBITOR 1000 MG IV SOLR
2464.0000 mg | INTRAVENOUS | Status: DC
  Administered 2016-10-06: 2464 mg via INTRAVENOUS
  Filled 2016-10-06: qty 2464

## 2016-10-06 MED ORDER — SODIUM CHLORIDE 0.9 % IV SOLN: INTRAVENOUS | Status: DC

## 2016-10-06 NOTE — Discharge Instructions (Signed)
Alpha-1-proteinase Inhibitor Injection What is this medicine? ALPHA-1-PROTEINASE INHIBITOR (AL fa - 1 PRO tee nase in HIB i tor) is a drug that is used to replace an enzyme in patients with lung problems caused by low levels of alpha-1 antitrypsin (ATA). It is not a cure. This medicine may be used for other purposes; ask your health care provider or pharmacist if you have questions. COMMON BRAND NAME(S): Aralast, Aralast NP, Merita Norton, Prolastin, Prolastin C, Zemaira What should I tell my health care provider before I take this medicine? They need to know if you have any of these conditions: -IgA (immunoglobulin A) deficiency -an unusual or allergic reaction to alpha-1-proteinase inhibitor, other medicines, foods, dyes, or preservatives -pregnant or trying to get pregnant -breast-feeding How should I use this medicine? This medicine is for infusion into a vein. Your healthcare professional will decide if infusion in your home is right for you. You should be trained on how to do infusions by your healthcare professional. Follow the directions on the prescription label. Do not use your medicine more often than directed. It is important that you put your used needles and supplies in a special sharps container. Do not put them in a trash can. If you do not have a sharps container, call your pharmacist or healthcare provider to get one. Talk to your pediatrician regarding the use of this medicine in children. Special care may be needed. Overdosage: If you think you have taken too much of this medicine contact a poison control center or emergency room at once. NOTE: This medicine is only for you. Do not share this medicine with others. What if I miss a dose? This medicine is used once a week. It is important not to miss your dose. Call your health care professional if you are unable to keep an appointment. If you use this medicine at home and you miss a dose, use it as soon as you can and get back to a  weekly schedule. Call your healthcare professional if you are not sure what to do about a missed dose. What may interact with this medicine? Interactions are not expected. This list may not describe all possible interactions. Give your health care provider a list of all the medicines, herbs, non-prescription drugs, or dietary supplements you use. Also tell them if you smoke, drink alcohol, or use illegal drugs. Some items may interact with your medicine. What should I watch for while using this medicine? Tell your doctor or healthcare professional if your symptoms do not start to get better or if they get worse. Your condition will be monitored carefully while you are receiving this medicine. This medicine can cause serious allergic reactions. Ask your doctor or health care professional about an epinephrine pen and/or other supportive care for certain severe allergic reactions. This medicine is made from human plasma, and there is a small risk that it may contain certain types of viruses or bacteria. All products are processed to kill most viruses and bacteria. If you have questions concerning the risk of infections, discuss them with your doctor or health care professional. What side effects may I notice from receiving this medicine? Side effects that you should report to your doctor or health care professional as soon as possible: -allergic reactions like skin rash, itching or hives, swelling of the face, lips, or tongue -breathing problems -chest pain, tightness -feeling faint or lightheaded, falls -signs and symptoms of infection like fever or chills; cough; sore throat; pain or trouble passing urine -swelling  in your legs or feet -unusually weak or tired Side effects that usually do not require medical attention (report to your doctor or health care professional if they continue or are bothersome): -diarrhea -dizziness -headache -hot flashes or flushing -joint or muscle  pain -nausea -pain, redness, or irritation at site where injected -runny or stuffy nose -tiredness This list may not describe all possible side effects. Call your doctor for medical advice about side effects. You may report side effects to FDA at 1-800-FDA-1088. Where should I keep my medicine? Keep out of the reach of children. Unopened vials may be stored in a refrigerator between 2 and 8 degrees C (36 and 46 degrees F). Do not freeze. The vials may also be kept at room temperature, below 25 degrees C (77 degrees F) for up to 1 month. Do not shake. Do not re-refrigerate once the product has been stored at room temperature. Keep in the original carton until needed for use. Throw away any unused medicine after the expiration date on the label. NOTE: This sheet is a summary. It may not cover all possible information. If you have questions about this medicine, talk to your doctor, pharmacist, or health care provider.  2018 Elsevier/Gold Standard (2016-07-31 19:23:01)

## 2016-10-13 ENCOUNTER — Telehealth: Payer: Self-pay | Admitting: Pulmonary Disease

## 2016-10-13 ENCOUNTER — Encounter (HOSPITAL_COMMUNITY)
Admission: RE | Admit: 2016-10-13 | Discharge: 2016-10-13 | Disposition: A | Discharge status: Home / Self Care | Payer: Medicaid Other | Source: Ambulatory Visit | Attending: Adult Health | Admitting: Adult Health

## 2016-10-13 DIAGNOSIS — E8801 Alpha-1-antitrypsin deficiency: Secondary | ICD-10-CM | POA: Diagnosis not present

## 2016-10-13 MED ORDER — EMPTY CONTAINERS FLEXIBLE MISC
2464.0000 mg | Status: DC
  Administered 2016-10-13: 13:00:00 2464 mg via INTRAVENOUS
  Filled 2016-10-13: qty 2464

## 2016-10-13 MED ORDER — ONDANSETRON HCL 8 MG PO TABS: 8.0000 mg | ORAL_TABLET | Freq: Three times a day (TID) | ORAL | 2 refills | Status: DC | PRN

## 2016-10-13 NOTE — Telephone Encounter (Signed)
Ok for zofran  q8 h prn x 12  Ok to refill x 2 but cannot do any other meds for muscle spasm given how he's already on multiple meds that may interact

## 2016-10-13 NOTE — Telephone Encounter (Signed)
Patient mother called states that med pt is on for Alpha 1 causes severe muscle aches and would like to get an rx to help with this, possibly a 30 day rx not just 3 days. Additionally, she would like rx for Zofran instead of Phenergan because it causes patient to dry heave - Patient uses [facility] ph# [phone] -pr

## 2016-10-13 NOTE — Telephone Encounter (Signed)
Dr. Melvyn Novas  Please Advise in BQ's absence-  Nurse from hospital called in and stated pt came for his prolastin injection and pt is c/o severe muscle aches and wanted to know if we would be willing to call in a 30 day rx for a muscle relaxer and if we could call in zofran because the pt has been having nausea and mother states he has been dry heaving

## 2016-10-13 NOTE — Telephone Encounter (Signed)
atc hospital nurse Kristian, no answer, no option for vm. Spoke with pt's mother Otila Kluver, aware of recs.  zofran called in to preferred pharmacy.  nothing further needed.

## 2016-10-20 ENCOUNTER — Ambulatory Visit (HOSPITAL_COMMUNITY)
Admission: RE | Admit: 2016-10-20 | Discharge: 2016-10-20 | Disposition: A | Discharge status: Home / Self Care | Payer: Medicaid Other | Source: Ambulatory Visit | Attending: Adult Health | Admitting: Adult Health

## 2016-10-20 DIAGNOSIS — J449 Chronic obstructive pulmonary disease, unspecified: Secondary | ICD-10-CM | POA: Diagnosis present

## 2016-10-20 MED ORDER — EMPTY CONTAINERS FLEXIBLE MISC
2534.0000 mg | Status: DC
  Administered 2016-10-20: 2534 mg via INTRAVENOUS
  Filled 2016-10-20: qty 2534

## 2016-10-27 ENCOUNTER — Telehealth: Payer: Self-pay | Admitting: Adult Health

## 2016-10-27 ENCOUNTER — Encounter (HOSPITAL_COMMUNITY)
Admission: RE | Admit: 2016-10-27 | Discharge: 2016-10-27 | Disposition: A | Discharge status: Home / Self Care | Payer: Medicaid Other | Source: Ambulatory Visit | Attending: Adult Health | Admitting: Adult Health

## 2016-10-27 DIAGNOSIS — E8801 Alpha-1-antitrypsin deficiency: Secondary | ICD-10-CM | POA: Insufficient documentation

## 2016-10-27 MED ORDER — EMPTY CONTAINERS FLEXIBLE MISC: 2464.0000 mg | Status: DC

## 2016-10-27 NOTE — Telephone Encounter (Signed)
[facility]  She reports that the pt is there now for his prolastin infusion  They are concerned due to pt's mother telling them all the side effects he gets after each infusion he gets  She reports they told her he gets fever, shakes, "seizure like activity" and curls up in the fetal position  She states that the pt's mother did not want her to call us b/c the last time she called here the doc she spoke with told her that it did not matter b/c he would be dead in 2 years anyway  I spoke with TP about this  She states to have them hold the prolastin today and keep on with BQ 4/19 to discuss further  Spoke with Renee and notified her of this and she will relay to the pt Will forward to BQ just as an [place]

## 2016-10-29 NOTE — Telephone Encounter (Signed)
Hold prolastin until next visit

## 2016-10-30 ENCOUNTER — Other Ambulatory Visit: Payer: Self-pay | Admitting: Internal Medicine

## 2016-10-30 NOTE — Telephone Encounter (Signed)
[facility], aware to hold Prolastin until seen 11/09/16. Nothing further needed.

## 2016-11-03 ENCOUNTER — Encounter (HOSPITAL_COMMUNITY): Payer: Medicaid Other

## 2016-11-07 ENCOUNTER — Ambulatory Visit (INDEPENDENT_AMBULATORY_CARE_PROVIDER_SITE_OTHER): Payer: Medicaid Other | Admitting: Pulmonary Disease

## 2016-11-07 ENCOUNTER — Encounter: Payer: Self-pay | Admitting: Pulmonary Disease

## 2016-11-07 VITALS — BP 112/76 | HR 87 | Ht 65.0 in | Wt 94.0 lb

## 2016-11-07 DIAGNOSIS — E8801 Alpha-1-antitrypsin deficiency: Secondary | ICD-10-CM | POA: Diagnosis not present

## 2016-11-07 DIAGNOSIS — J449 Chronic obstructive pulmonary disease, unspecified: Secondary | ICD-10-CM | POA: Diagnosis not present

## 2016-11-07 DIAGNOSIS — Z720 Tobacco use: Secondary | ICD-10-CM | POA: Diagnosis not present

## 2016-11-07 MED ORDER — TIOTROPIUM BROMIDE MONOHYDRATE 1.25 MCG/ACT IN AERS: 2.0000 | INHALATION_SPRAY | Freq: Every day | RESPIRATORY_TRACT | 11 refills | Status: DC

## 2016-11-07 MED ORDER — ALBUTEROL SULFATE HFA 108 (90 BASE) MCG/ACT IN AERS: 2.0000 | INHALATION_SPRAY | Freq: Four times a day (QID) | RESPIRATORY_TRACT | 11 refills | Status: DC | PRN

## 2016-11-07 MED ORDER — FLUTICASONE-SALMETEROL 250-50 MCG/DOSE IN AEPB
1.0000 | INHALATION_SPRAY | Freq: Two times a day (BID) | RESPIRATORY_TRACT | 11 refills | Status: DC
Start: 2016-11-07 — End: 2017-02-23

## 2016-11-07 MED ORDER — TIOTROPIUM BROMIDE MONOHYDRATE 1.25 MCG/ACT IN AERS: 2.0000 | INHALATION_SPRAY | Freq: Every day | RESPIRATORY_TRACT | 0 refills | Status: DC

## 2016-11-07 NOTE — Addendum Note (Signed)
Addended by: Len Blalock on: 11/07/2016 10:50 AM   Modules accepted: Orders

## 2016-11-07 NOTE — Patient Instructions (Signed)
Tobacco abuse: Quit smoking  COPD: Take Advair twice a day Takes Spiriva once daily Use Proventil as needed for shortness of breath  For alpha-1 antitrypsin deficiency: We will reach out to the company who makes this medicine to discuss the reaction even having with their pharmacist In the meantime we will not prescribe this further until we have clear guidance from them in terms of how to manage side effects  Follow-up 2-3 months or sooner if needed

## 2016-11-07 NOTE — Progress Notes (Signed)
Subjective:    Patient ID: [name]., male    DOB: 1981/02/11, 36 y.o.   MRN: [id number]  Synopsis: Referred in January 2018 for COPD. Found to have alpha-1 antitrypsin deficiency. Still smoking as of February 2018.  HPI Chief Complaint  Patient presents with  . Follow-up    pt needs refills on inhalers.  has d/c'ed prolastin d/t side effects.      When [name] took his last treatment of prolastin he had nausea, vomiting, and muscle cramps which started 1 hour after treatment and lasted for about 48 hours.  On the third treatment he says that he was bent over on the bed and he had a lot of shaking.  On Saturday and Sunday he continued to have significant shaking.  His mother felt that he had shaking.  He has had four total treatments.   He feels like his breathing is better.  His family feels that the hospital may have mixed up the medication.  He didn't receive the 4th infusion because of his reaction to the medicine. The last treatment was March 30, but this isn't any better.  Since then his breathing has worsened.  He is having tightness in his chest and dyspnea.  He needs refills on his albuterol HFA.   He is out of his advair and "pretty much anything" for at least the past week.    He says that he is out of his clonazepam and suboxone and is asking for a refill.  He is still smoking, smoking 1/3 pack per day.    Past Medical History:  Diagnosis Date  . Anxiety   . Asthma    mild   . Bipolar 1 disorder (HCC)    Self laceration and self harm behaviors   . Crohn disease ([place]) Dx 2004   terminal ileum Crohn's s/p ileocolectomy with anastamosis  . Depression   . Kidney stones   . Substance abuse       Review of Systems  Constitutional: Negative for chills, fatigue and fever.  HENT: Negative for postnasal drip, rhinorrhea and sinus pain.   Respiratory: Positive for cough, shortness of breath and wheezing.   Cardiovascular: Negative for chest pain, palpitations  and leg swelling.       Objective:   Physical Exam Vitals:   11/07/16 1022  BP: 112/76  Pulse: 87  SpO2: 100%  Weight: 94 lb (42.6 kg)  Height: 5' 5"  (1.651 m)   RA  Gen: chronically ill appearing HENT: OP clear, TM's clear, neck supple PULM: Wheezing bilaterally, normal percussion CV: RRR, no mgr, trace edema GI: BS+, soft, nontender Derm: no cyanosis or rash Psyche: normal mood and affect   PFT: 07/2016 Ratio 38%, FEV1 0.96L 26% pred  Blood work: 07/2016 Alpha 1 level 25, ZZ phenotype     Assessment & Plan:  Impression: Tobacco abuse COPD Alpha-1 antitrypsin deficiency, ZZ phenotype Nausea and vomiting, reaction to Prolastin infusion Drug-seeking behavior  Discussion: Today [name] asked me for refills on his chronic benzodiazepine and narcotic prescriptions. I told him no and will not prescribe those medicines from this clinic.  In regards to his COPD which is advanced we have worked diligently to try to help him as best as possible but he is not carrying his into the bargain. Is not taking his inhaled medicines and he continues to smoke cigarettes. He claims to have had a reaction to the Prolastin. We will discuss this further with the manufacture of Prolastin, though  admittedly any benefit he received from this is quickly mitigated by his ongoing tobacco use.  Further, noncompliance with inhaled therapy is also contributing to further symptoms. This is explained to him today in no uncertain terms.  Tobacco abuse: Quit smoking  COPD: Take Advair twice a day Takes Spiriva once daily Use Proventil as needed for shortness of breath  For alpha-1 antitrypsin deficiency: We will reach out to the company who makes this medicine to discuss the reaction even having with their pharmacist In the meantime we will not prescribe this further until we have clear guidance from them in terms of how to manage side effects  Follow-up 2-3 months or sooner if  needed     Current Outpatient Prescriptions:  .  albuterol (PROVENTIL HFA;VENTOLIN HFA) 108 (90 Base) MCG/ACT inhaler, Inhale 2 puffs into the lungs every 6 (six) hours as needed for wheezing or shortness of breath., Disp: 1 Inhaler, Rfl: 11 .  budesonide-formoterol (SYMBICORT) 160-4.5 MCG/ACT inhaler, Inhale 2 puffs into the lungs 2 (two) times daily., Disp: 1 Inhaler, Rfl: 0 .  Buprenorphine HCl-Naloxone HCl (SUBOXONE) 8-2 MG FILM, Place 1 Film under the tongue 3 (three) times daily., Disp: , Rfl:  .  clonazePAM (KLONOPIN) 0.5 MG tablet, Take 0.5 mg by mouth 2 (two) times daily., Disp: , Rfl:  .  cyproheptadine (PERIACTIN) 4 MG tablet, Take 1 tablet (4 mg total) by mouth 3 (three) times daily as needed for allergies., Disp: 30 tablet, Rfl: 0 .  gabapentin (NEURONTIN) 300 MG capsule, Take 2 capsules (600 mg total) by mouth 2 (two) times daily., Disp: 120 capsule, Rfl: 2 .  hydrOXYzine (ATARAX/VISTARIL) 50 MG tablet, Take 1 tablet (50 mg total) by mouth 3 (three) times daily as needed., Disp: 90 tablet, Rfl: 1 .  ipratropium-albuterol (DUONEB) 0.5-2.5 (3) MG/3ML SOLN, Take 3 mLs by nebulization every 6 (six) hours as needed (shortness of breath/ wheezing)., Disp: 360 mL, Rfl: 3 .  promethazine (PHENERGAN) 25 MG tablet, Take 25 mg by mouth 3 (three) times daily as needed for nausea or vomiting. , Disp: , Rfl: 3 .  sertraline (ZOLOFT) 50 MG tablet, Take 1 tablet (50 mg total) by mouth daily., Disp: 30 tablet, Rfl: 3 .  Tiotropium Bromide Monohydrate (SPIRIVA RESPIMAT) 1.25 MCG/ACT AERS, Inhale 2 puffs into the lungs daily., Disp: 4 g, Rfl: 0 .  ondansetron (ZOFRAN) 8 MG tablet, Take 1 tablet (8 mg total) by mouth every 8 (eight) hours as needed for nausea or vomiting. (Patient not taking: Reported on 11/07/2016), Disp: 12 tablet, Rfl: 2

## 2016-11-08 ENCOUNTER — Telehealth: Payer: Self-pay

## 2016-11-08 NOTE — Telephone Encounter (Signed)
Started PA on Advair .and it was approved through [employer] number: [id number]

## 2016-11-09 ENCOUNTER — Ambulatory Visit: Payer: Medicaid Other | Admitting: Pulmonary Disease

## 2016-11-09 ENCOUNTER — Telehealth: Payer: Self-pay | Admitting: Pulmonary Disease

## 2016-11-09 NOTE — Telephone Encounter (Signed)
[name] with [facility] called asking for some clarification - when [name] went to schedule pt's Prolastin infusion pt stated that he was to not continue the Prolastin  Per BQ's 4.17.18 office note: For alpha-1 antitrypsin deficiency: We will reach out to the company who makes this medicine to discuss the reaction even having with their pharmacist In the meantime we will not prescribe this further until we have clear guidance from them in terms of how to manage side effects   This was read back to [name] - she voiced her understanding Nothing further needed; will sign off.

## 2016-11-10 ENCOUNTER — Inpatient Hospital Stay (HOSPITAL_COMMUNITY): Admission: RE | Admit: 2016-11-10 | Payer: Medicaid Other | Source: Ambulatory Visit

## 2016-11-14 ENCOUNTER — Other Ambulatory Visit: Payer: Self-pay | Admitting: Internal Medicine

## 2016-11-15 NOTE — Telephone Encounter (Signed)
MW please advise if ok to refill the ondansetron  for this pt.  Last refilled on 10/18/16 for #12 with 2 refills.  thanks

## 2016-11-17 ENCOUNTER — Encounter (HOSPITAL_COMMUNITY): Payer: Medicaid Other

## 2016-11-21 ENCOUNTER — Telehealth: Payer: Self-pay | Admitting: Pulmonary Disease

## 2016-11-21 NOTE — Telephone Encounter (Signed)
Spoke with Darla @ [facility] [employer], states that normally Prolastin is very well tolerated and the only time that there is reaction is if the mediation is infused too quickly. Darla states that this medication is normally infused slowly over a 68mn+ window and anything any quicker can cause similar side effects like the patient had. Darla states that if the patient wants to try the infusion again then we can write a specific order to have the medication infused slower and specifically state to run it in a 438m-1hr window.   Darla also states that he could have had a reaction to the Donor and not necessarily the medication. Darla states that she is going to reach out to the patient and see if he wants to give the infusion another try and if so then our office can write the order for the infusion.  Darla is wanting to try this before discontinuing the medication.   Please advise Dr McLake BellsThanks.

## 2016-11-22 NOTE — Telephone Encounter (Signed)
Spoke with Darla at [employer], states she spoke with pt's mother, who advised that patient was receiving his infusions within 10-15 minutes.  Pt is willing to try Prolastin again under a longer run time (45 minutes). New order would have to be placed to facility to specify run time of 45 minute minimum.    Darla also notes that pt is still smoking- has been advised that infusions are not going to be optimal if he continues to smoke.  Per Darla, pt's mother states she will help to ensure that pt quits smoking.  BQ please advise if you'd like pt to resume Prolastin infusions.  Thanks.

## 2016-11-22 NOTE — Telephone Encounter (Signed)
Darla calling to give update after speaking to family, she can be reached @ [phone].Hillery Hunter

## 2016-11-24 ENCOUNTER — Other Ambulatory Visit: Payer: Self-pay | Admitting: Internal Medicine

## 2016-11-27 NOTE — Telephone Encounter (Signed)
Form filled out but needs BQ's sig. Was unable to place in his look at folder due to it not being in AC's cubby, so forms are in her cubby for his to sign. AC please let us know when this is signed

## 2016-11-27 NOTE — Telephone Encounter (Signed)
OK by me but we need to send an order to run the drug in over 45 minutes

## 2016-12-01 NOTE — Telephone Encounter (Signed)
Spoke with Amy-Darla is out of the office today. We need to confirm if she rec'd form back on patient as there are no forms in BQ's box.   Amy was able to get Lori-another pharmacist on the phone-she confirmed they have rec'd any paperwork back from our office. I will have to forward to BQ and AC to see if they are still working on this. Thanks.

## 2016-12-05 NOTE — Telephone Encounter (Signed)
Ash, below it says that [facility] confirmed they did receive the forms  Is there anything further needed or can we close this encounter? Please advise, thanks!

## 2016-12-05 NOTE — Telephone Encounter (Signed)
Will close encounter

## 2016-12-06 ENCOUNTER — Telehealth: Payer: Self-pay | Admitting: Pulmonary Disease

## 2016-12-06 ENCOUNTER — Other Ambulatory Visit: Payer: Self-pay | Admitting: Internal Medicine

## 2016-12-06 NOTE — Telephone Encounter (Signed)
Rx for Zofran 35m was prescribed on 11/15/16 with 2 refilled. I have called [facility], who confirmed Rx had been received. Pt's mother is aware and voiced her understanding. Nothing further needed.

## 2016-12-06 NOTE — Telephone Encounter (Signed)
LMTCB

## 2016-12-07 ENCOUNTER — Encounter: Payer: Self-pay | Admitting: Internal Medicine
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
## 2016-12-07 NOTE — Telephone Encounter (Signed)
Called and spoke to pt's mother. She states the pt has not yet started the Prolastin infusions. Called Darla with [facility], at [phone], and she states a new rx needs to be sent to [facility] short stay. Short stay from completed and faxed and given to [facility] to hold until infusion has been scheduled.   Will forward to [place] to f/u on appt.

## 2016-12-07 NOTE — Telephone Encounter (Signed)
Called Laverne at [facility]-LM x 1

## 2016-12-08 ENCOUNTER — Encounter: Payer: Self-pay | Admitting: Internal Medicine

## 2016-12-08 NOTE — Telephone Encounter (Signed)
Renee,nurse from [facility] @ [facility] - infusion part.  Does doctor still want patient to get med with terrible reactions patient had at last infusion. CB is [phone].

## 2016-12-08 NOTE — Telephone Encounter (Signed)
Joseph Art is calling from short stay about this pt---BQ do you still want the pt to have the infusion due to the terrible reactions that he had at his last infusion.  thanks

## 2016-12-11 ENCOUNTER — Encounter: Payer: Self-pay | Admitting: Internal Medicine

## 2016-12-11 NOTE — Telephone Encounter (Signed)
Spoke with Renee at [place] at [facility], aware to d/c Prolastin Infusions. Nothing further needed.

## 2016-12-11 NOTE — Telephone Encounter (Signed)
Discontinue the infusions

## 2016-12-12 ENCOUNTER — Telehealth: Payer: Self-pay | Admitting: Pulmonary Disease

## 2016-12-12 NOTE — Telephone Encounter (Signed)
Spoke with patient Mother [name], wants to discuss why the infusions were stopped. Mother states that the patient was having his infusions administered in a 12-15 minute time frame with no piggyback which is why he was having severe reactions. Mother states that this is why the patient had this type of reaction. [name] states that [facility] refused to have training shown for the administration of the medication. [name] states that they were offered specific training on how to administer this and education about the medication and they refused. [name] stated that the staff had a nasty attitude basically stating that "they did not need training for something that they do everyday".   [name] states that she spoke with Darla with the [employer] after his reaction and was told the the patient needs to be started back on the treatment or he may not live 26mo3yr with how progressive his disease is. States that [facility] also [facility] with how fast this was being infused which is why he was having such terrible reactions.   [facility] specifically stated that the medication needed to be infused over a 45-626m time frame -- pt mother states that they were only at the hospital for about a total of 45 minutes with each appt and each time the medication was infused in less than 15 mins.   Pt Mother is [facility] to know [facility]  Please advise Dr McLake Bellsext steps. Thanks.

## 2016-12-12 NOTE — Telephone Encounter (Signed)
If what she is saying is true then yes, I'm willing to try it at [place] him to quit smoking.

## 2016-12-12 NOTE — Telephone Encounter (Signed)
Contacted mother and informed her of BQ's message. She agrees with the plan. Called [facility] and was transferred to the infusion center but was unable to reach the scheduler. Left a vm for them to call us back.

## 2016-12-13 NOTE — Telephone Encounter (Signed)
thanks

## 2016-12-13 NOTE — Telephone Encounter (Signed)
The only option I see then is for [place] to reach out to the [facility] infusion center management to see if the mother's claims are valid.

## 2016-12-13 NOTE — Telephone Encounter (Addendum)
Called [facility] At [facility] to speak to a Nurse or Manager regarding patient's recent infusions and was advised that there was no nurse or Manager that I could speak with. When I told the nurse the patient's name she stated "Oh Lord" and huffed then asked why we were calling, that his Prolastin infusions had been discontinued. I advised again that I was calling from Dr Anastasia Pall office in reference to this patient to get information regarding his infusions and was then advised to call back 12/14/16 AM after 8am to speak to someone. Will call 12/14/16.   ATC patient Mother Otila Kluver to make aware that we are working on this still.  Will hold in triage.   Will send to Dr Lake Bells as Juluis Rainier of interaction with Short Stay

## 2016-12-13 NOTE — Telephone Encounter (Signed)
Called infusion center at [facility], states that provider would have to have admitting privileges to [facility] since they do not have a physician in infusion center.  Because BQ does not have admitting privileges to [facility], we cannot order prolastin there.    BQ please advise.  Thanks.

## 2016-12-14 NOTE — Telephone Encounter (Signed)
Spoke with patient's Mother, Otila Kluver, aware that we are still working with [facility] to find out information needed prior to restarting the infusions. Aware that we have contacted Dr Lake Bells to make him aware of everything going on. Otila Kluver is aware that we will be touch.
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
## 2016-12-14 NOTE — Telephone Encounter (Signed)
Pt mother returning call and can be reached @[phone] .Hillery Hunter

## 2016-12-15 NOTE — Telephone Encounter (Signed)
Discontinue infusions Can discuss face to face on next visit

## 2016-12-15 NOTE — Telephone Encounter (Signed)
Will call short stay on 12/19/16, as it is currently after hours. I have spoken with pt's mother, Otila Kluver and made her aware of below message. Otila Kluver voiced her understanding and had no further questions.  Will hold message in triage.

## 2016-12-19 ENCOUNTER — Telehealth: Payer: Self-pay | Admitting: Pulmonary Disease

## 2016-12-19 NOTE — Telephone Encounter (Signed)
BQ  Please Advise-  Darla from [facility] called in regards to pt's infusions. I informed her that you d/c-ed pt's infusion to further discuss until pt's follow up appt. She just wanted to know on her end should they just hold onto the order for the pt until you see him or go ahead and d/c order and then if you decide to place him back on do a new order.

## 2016-12-19 NOTE — Telephone Encounter (Signed)
Pt is not currently scheduled for any infusions with [facility].  They are aware that the pt will not be doing the infusions now.

## 2016-12-20 NOTE — Telephone Encounter (Signed)
Spoke with Darla and notified of response per BQ  She verbalized understanding

## 2016-12-20 NOTE — Telephone Encounter (Signed)
BQ - please advise. Thanks. 

## 2016-12-20 NOTE — Telephone Encounter (Signed)
D/c order

## 2016-12-27 ENCOUNTER — Other Ambulatory Visit: Payer: Self-pay | Admitting: Internal Medicine

## 2016-12-29 ENCOUNTER — Emergency Department (HOSPITAL_COMMUNITY): Payer: Medicaid Other

## 2016-12-29 ENCOUNTER — Encounter (HOSPITAL_COMMUNITY): Payer: Self-pay | Admitting: Neurology

## 2016-12-29 ENCOUNTER — Emergency Department (HOSPITAL_COMMUNITY)
Admission: EM | Admit: 2016-12-29 | Discharge: 2016-12-29 | Disposition: A | Discharge status: Home / Self Care | Payer: Medicaid Other | Attending: Emergency Medicine | Admitting: Emergency Medicine

## 2016-12-29 DIAGNOSIS — Z880 Allergy status to penicillin: Secondary | ICD-10-CM | POA: Insufficient documentation

## 2016-12-29 DIAGNOSIS — Z87891 Personal history of nicotine dependence: Secondary | ICD-10-CM | POA: Insufficient documentation

## 2016-12-29 DIAGNOSIS — J4541 Moderate persistent asthma with (acute) exacerbation: Secondary | ICD-10-CM | POA: Insufficient documentation

## 2016-12-29 DIAGNOSIS — J449 Chronic obstructive pulmonary disease, unspecified: Secondary | ICD-10-CM | POA: Diagnosis not present

## 2016-12-29 DIAGNOSIS — R0602 Shortness of breath: Secondary | ICD-10-CM | POA: Diagnosis present

## 2016-12-29 DIAGNOSIS — M549 Dorsalgia, unspecified: Secondary | ICD-10-CM | POA: Diagnosis not present

## 2016-12-29 HISTORY — DX: Chronic obstructive pulmonary disease, unspecified: J44.9

## 2016-12-29 LAB — COMPREHENSIVE METABOLIC PANEL
ALT: 93 U/L — AB (ref 17–63)
AST: 77 U/L — AB (ref 15–41)
Albumin: 4 g/dL (ref 3.5–5.0)
Alkaline Phosphatase: 97 U/L (ref 38–126)
Anion gap: 6 (ref 5–15)
BILIRUBIN TOTAL: 0.7 mg/dL (ref 0.3–1.2)
BUN: 7 mg/dL (ref 6–20)
CHLORIDE: 105 mmol/L (ref 101–111)
CO2: 26 mmol/L (ref 22–32)
CREATININE: 0.81 mg/dL (ref 0.61–1.24)
Calcium: 9.1 mg/dL (ref 8.9–10.3)
GFR calc Af Amer: >60 mL/min (ref >60)
GLUCOSE: 86 mg/dL (ref 65–99)
Potassium: 3.9 mmol/L (ref 3.5–5.1)
Sodium: 137 mmol/L (ref 135–145)
Total Protein: 7.5 g/dL (ref 6.5–8.1)

## 2016-12-29 LAB — CBC WITH DIFFERENTIAL/PLATELET
Basophils Absolute: 0 10*3/uL (ref 0.0–0.1)
Basophils Relative: 0 %
Eosinophils Absolute: 0.4 10*3/uL (ref 0.0–0.7)
Eosinophils Relative: 5 %
HEMATOCRIT: 46.5 % (ref 39.0–52.0)
Hemoglobin: 16.1 g/dL (ref 13.0–17.0)
LYMPHS PCT: 28 %
Lymphs Abs: 2.4 10*3/uL (ref 0.7–4.0)
MCH: 32.3 pg (ref 26.0–34.0)
MCHC: 34.6 g/dL (ref 30.0–36.0)
MCV: 93.2 fL (ref 78.0–100.0)
Monocytes Absolute: 0.6 10*3/uL (ref 0.1–1.0)
Monocytes Relative: 6 %
NEUTROS ABS: 5.2 10*3/uL (ref 1.7–7.7)
Neutrophils Relative %: 61 %
PLATELETS: 223 10*3/uL (ref 150–400)
RBC: 4.99 MIL/uL (ref 4.22–5.81)
RDW: 13.5 % (ref 11.5–15.5)
WBC: 8.6 10*3/uL (ref 4.0–10.5)

## 2016-12-29 MED ORDER — ALBUTEROL SULFATE (2.5 MG/3ML) 0.083% IN NEBU
5.0000 mg | INHALATION_SOLUTION | Freq: Once | RESPIRATORY_TRACT | Status: AC
  Administered 2016-12-29: 5 mg via RESPIRATORY_TRACT

## 2016-12-29 MED ORDER — GABAPENTIN 300 MG PO CAPS: 600.0000 mg | ORAL_CAPSULE | Freq: Two times a day (BID) | ORAL | 0 refills | Status: DC

## 2016-12-29 MED ORDER — PREDNISONE 10 MG PO TABS: ORAL_TABLET | ORAL | 0 refills | Status: DC

## 2016-12-29 MED ORDER — PREDNISONE 20 MG PO TABS
60.0000 mg | ORAL_TABLET | Freq: Every day | ORAL | Status: DC
  Administered 2016-12-29: 60 mg via ORAL
  Filled 2016-12-29: qty 3

## 2016-12-29 MED ORDER — ALBUTEROL SULFATE (2.5 MG/3ML) 0.083% IN NEBU: 2.5000 mg | INHALATION_SOLUTION | Freq: Four times a day (QID) | RESPIRATORY_TRACT | 12 refills | Status: DC | PRN

## 2016-12-29 MED ORDER — ALBUTEROL SULFATE (2.5 MG/3ML) 0.083% IN NEBU
5.0000 mg | INHALATION_SOLUTION | Freq: Once | RESPIRATORY_TRACT | Status: AC
  Administered 2016-12-29: 5 mg via RESPIRATORY_TRACT
  Filled 2016-12-29: qty 6

## 2016-12-29 MED ORDER — ALBUTEROL SULFATE (2.5 MG/3ML) 0.083% IN NEBU
INHALATION_SOLUTION | RESPIRATORY_TRACT | Status: AC
  Filled 2016-12-29: qty 6

## 2016-12-29 NOTE — Discharge Instructions (Signed)
See your Physician at the wellness center for evaluation.  See your Pulmonary Doctor to discuss continued treatment.

## 2016-12-29 NOTE — ED Notes (Signed)
ED Provider at bedside. 

## 2016-12-29 NOTE — ED Provider Notes (Signed)
[facility] DEPT Provider Note   CSN: [id number] Arrival date & time: 12/29/16  0820     History   Chief Complaint Chief Complaint  Patient presents with  . Shortness of Breath  . Back Pain    HPI Clifford Klahn Kobi Mario. is a 36 y.o. male.  36 year old with a past medical history of COPD and alpha-1 antitrypsin deficiency presents to the ER with "a couple days" of shortness of breath and pleuritic chest pain.  Patient states that he is unable to take a deep breath due to pain in his ribs and back.  Patient has not experienced symptoms to this extreme before.  Patient endorses wet cough, wheezing, orthopnea, dyspnea, insomnia, and occasional fevers. Patient has a nebulizer and albuterol inhaler at home that has not provided much relief.  Patient endorses palpitations and numbness in his fingers.  Further endorses decreased appetite and nausea. Patient has a history of chronic low back pain that he takes Gabapentin for.  Extensive family history of CHF.  Patient denies hemoptysis, vomiting, diarrhea, constipation, and abdominal pain.   The history is provided by the patient. No language interpreter was used.  Shortness of Breath  Associated symptoms include a fever, cough, wheezing and chest pain. Pertinent negatives include no vomiting and no abdominal pain.  Back Pain   Associated symptoms include chest pain and a fever. Pertinent negatives include no abdominal pain.    Past Medical History:  Diagnosis Date  . Anxiety   . Asthma    mild   . Bipolar 1 disorder (HCC)    Self laceration and self harm behaviors   . COPD (chronic obstructive pulmonary disease) ([place])   . Crohn disease ([place]) Dx 2004   terminal ileum Crohn's s/p ileocolectomy with anastamosis  . Depression   . Kidney stones   . Substance abuse     Patient Active Problem List   Diagnosis Date Noted  . COPD (chronic obstructive pulmonary disease) ([place]) 08/08/2016  . Alpha-1-antitrypsin deficiency ([place])  08/03/2016  . Diarrhea   . Crohn's disease with complication ([place]) 19/41/7408  . Depression 02/27/2016  . Substance abuse 02/27/2016  . Influenza with pneumonia 10/07/2014  . Protein-calorie malnutrition, severe ([place]) 10/06/2014  . Sepsis ([place]) 10/05/2014  . CAP (community acquired pneumonia) 10/05/2014  . Asthma 10/05/2014  . Generalized abdominal pain   . Hematemesis 09/04/2011  . ANEMIA 09/20/2009  . BIPOLAR DISORDER UNSPECIFIED 09/20/2009  . ADHD 09/20/2009  . Regional enteritis ([place]) 09/20/2009  . INTESTINAL OBSTRUCTION, HX OF 09/20/2009  . COCAINE ABUSE, HX OF 09/20/2009    Past Surgical History:  Procedure Laterality Date  . COLON SURGERY  01/17/2006  . ESOPHAGOGASTRODUODENOSCOPY  09/04/2011   Procedure: ESOPHAGOGASTRODUODENOSCOPY (EGD);  Surgeon: Estanislado Emms., MD,FACG;  Location: [facility];  Service: Endoscopy;  Laterality: N/A;  . INGUINAL HERNIA REPAIR     On the right and the left   . TONSILLECTOMY     20 yrs ago       Home Medications    Prior to Admission medications   Medication Sig Start Date End Date Taking? Authorizing Provider  albuterol (PROVENTIL HFA;VENTOLIN HFA) 108 (90 Base) MCG/ACT inhaler Inhale 2 puffs into the lungs every 6 (six) hours as needed for wheezing or shortness of breath. 11/07/16   Juanito Doom, MD  Buprenorphine HCl-Naloxone HCl (SUBOXONE) 8-2 MG FILM Place 1 Film under the tongue 3 (three) times daily.    [provider]  clonazePAM (KLONOPIN) 0.5 MG  tablet Take 0.5 mg by mouth 2 (two) times daily.    [provider]  cyproheptadine (PERIACTIN) 4 MG tablet Take 1 tablet (4 mg total) by mouth 3 (three) times daily as needed for allergies. 07/05/16   Langeland, Dawn T, MD  Fluticasone-Salmeterol (ADVAIR DISKUS) 250-50 MCG/DOSE AEPB Inhale 1 puff into the lungs 2 (two) times daily. 11/07/16   Juanito Doom, MD  gabapentin (NEURONTIN) 300 MG capsule Take 2 capsules (600 mg total) by mouth 2 (two) times  daily. 09/01/16   Maren Reamer, MD  hydrOXYzine (ATARAX/VISTARIL) 50 MG tablet Take 1 tablet (50 mg total) by mouth 3 (three) times daily as needed. 07/05/16   Langeland, Dawn T, MD  ipratropium-albuterol (DUONEB) 0.5-2.5 (3) MG/3ML SOLN Take 3 mLs by nebulization every 6 (six) hours as needed (shortness of breath/ wheezing). 07/05/16   Maren Reamer, MD  ondansetron (ZOFRAN) 8 MG tablet Take 1 tablet (8 mg total) by mouth every 8 (eight) hours as needed for nausea or vomiting. 11/15/16   Tanda Rockers, MD  promethazine (PHENERGAN) 25 MG tablet Take 25 mg by mouth 3 (three) times daily as needed for nausea or vomiting.  02/22/16   [provider]  sertraline (ZOLOFT) 50 MG tablet Take 1 tablet (50 mg total) by mouth daily. 10/30/16   Maren Reamer, MD  Tiotropium Bromide Monohydrate (SPIRIVA RESPIMAT) 1.25 MCG/ACT AERS Inhale 2 puffs into the lungs daily. 11/07/16   Juanito Doom, MD  Tiotropium Bromide Monohydrate (SPIRIVA RESPIMAT) 1.25 MCG/ACT AERS Inhale 2 puffs into the lungs daily. 11/07/16   Juanito Doom, MD    Family History Family History  Problem Relation Age of Onset  . Hypertension Other   . Diabetes Other   . COPD Maternal Grandfather     Social History Social History  Substance Use Topics  . Smoking status: Current Every Day Smoker    Packs/day: 0.50    Years: 25.00    Types: Cigarettes  . Smokeless tobacco: Never Used  . Alcohol use Yes     Comment: occsionally     Allergies   Penicillins and Diphenhydramine hcl   Review of Systems Review of Systems  Constitutional: Positive for appetite change, fatigue and fever.  HENT: Positive for congestion.   Respiratory: Positive for cough, shortness of breath and wheezing. Negative for stridor.   Cardiovascular: Positive for chest pain and palpitations.  Gastrointestinal: Positive for nausea. Negative for abdominal pain, constipation, diarrhea and vomiting.  Musculoskeletal: Positive for back  pain.  All other systems reviewed and are negative.    Physical Exam Updated Vital Signs BP 116/87 (BP Location: Left Arm)   Pulse (!) 103   Temp 98 F (36.7 C) (Oral)   Resp 20   Ht 5' 5"  (1.651 m)   Wt 43.1 kg (95 lb)   SpO2 97%   BMI 15.81 kg/m   Physical Exam  Constitutional: He is oriented to person, place, and time.  Well-developed, thin male that appears older than stated age.  Eyes: EOM are normal.  Neck: Normal range of motion.  Cardiovascular:  Slightly tachycardic. No murmurs, rubs, or gallops on auscultation.   Pulmonary/Chest: Effort normal. He has wheezes.  Abdominal: Soft.  Neurological: He is alert and oriented to person, place, and time.  Skin: Capillary refill takes less than 2 seconds.  Wound on right nasal bone, no drainage or bleeding  Nursing note and vitals reviewed.    ED Treatments / Results  Labs (  all labs ordered are listed, but only abnormal results are displayed) Labs Reviewed  CBC WITH DIFFERENTIAL/PLATELET  COMPREHENSIVE METABOLIC PANEL    EKG  EKG Interpretation  Date/Time:  Friday December 29 2016 08:31:31 EDT Ventricular Rate:  97 PR Interval:  128 QRS Duration: 90 QT Interval:  348 QTC Calculation: 441 R Axis:   86 Text Interpretation:  Normal sinus rhythm Right atrial enlargement Borderline ECG agree. no acute ischemia. no change from old Confirmed by Charlesetta Shanks [phone]) on 12/29/2016 9:03:14 AM       Radiology No results found.  Procedures Procedures (including critical care time)  Medications Ordered in ED Medications  albuterol (PROVENTIL) (2.5 MG/3ML) 0.083% nebulizer solution 5 mg (not administered)  albuterol (PROVENTIL) (2.5 MG/3ML) 0.083% nebulizer solution (not administered)  predniSONE (DELTASONE) tablet 60 mg (not administered)     Initial Impression / Assessment and Plan / ED Course  I have reviewed the triage vital signs and the nursing notes.  Pertinent labs & imaging results that were available  during my care of the patient were reviewed by me and considered in my medical decision making (see chart for details).     Pt feels better after duoneb and albuterol neb.   Pt reports he needs a neb machine and neb solution.  Pt is also out of gabapentin and request a refill.   Pt is here with his Mother.   She is interested in having pt see a pulmonologist in [place] and plans to make appointment for pt to be seen there.  Pt has follow up scheduled with Dr. Lake Bells.  I advised pt he must stop smoking.  He is given rx for prednisone, albuterol, gabapentin and nebulizer.   I reviewed records.  Pt has a history of noncompliance.  I discussed importance of following treatment plan.    Final Clinical Impressions(s) / ED Diagnoses   Final diagnoses:  Moderate persistent asthma with exacerbation    New Prescriptions New Prescriptions   No medications on file   Current Meds  Medication Sig  . clonazePAM (KLONOPIN) 0.5 MG tablet Take 0.5 mg by mouth 2 (two) times daily.  . Fluticasone-Salmeterol (ADVAIR DISKUS) 250-50 MCG/DOSE AEPB Inhale 1 puff into the lungs 2 (two) times daily.  . hydrOXYzine (ATARAX/VISTARIL) 50 MG tablet Take 1 tablet (50 mg total) by mouth 3 (three) times daily as needed.  [name] Kitchen ipratropium-albuterol (DUONEB) 0.5-2.5 (3) MG/3ML SOLN Take 3 mLs by nebulization every 6 (six) hours as needed (shortness of breath/ wheezing).  . ondansetron (ZOFRAN) 8 MG tablet Take 1 tablet (8 mg total) by mouth every 8 (eight) hours as needed for nausea or vomiting.  . promethazine (PHENERGAN) 25 MG tablet Take 25 mg by mouth 3 (three) times daily as needed for nausea or vomiting.   . Tiotropium Bromide Monohydrate (SPIRIVA RESPIMAT) 1.25 MCG/ACT AERS Inhale 2 puffs into the lungs daily.  . [DISCONTINUED] albuterol (PROVENTIL HFA;VENTOLIN HFA) 108 (90 Base) MCG/ACT inhaler Inhale 2 puffs into the lungs every 6 (six) hours as needed for wheezing or shortness of breath.  . [DISCONTINUED]  gabapentin (NEURONTIN) 300 MG capsule Take 2 capsules (600 mg total) by mouth 2 (two) times daily.   An After Visit Summary was printed and given to the patient.   Fransico Meadow, PA-C 12/29/16 [place], [place], MD 01/03/17 [phone]

## 2016-12-29 NOTE — ED Notes (Signed)
Patient transported to X-ray 

## 2016-12-29 NOTE — ED Triage Notes (Signed)
Pt here reporting left middle back pain x 4 days. Denies injury. Also feeling sob, cough with COPD. Is a x 4.

## 2016-12-29 NOTE — ED Notes (Signed)
Pt to restroom via wheelchair. No distress noted. Vitals stable.

## 2017-01-09 ENCOUNTER — Telehealth: Payer: Self-pay | Admitting: Pulmonary Disease

## 2017-01-09 ENCOUNTER — Ambulatory Visit: Payer: Medicaid Other | Admitting: Pulmonary Disease

## 2017-01-09 NOTE — Telephone Encounter (Signed)
I am happy to see as a second opinion then we can decide whether or not I can help him but needs to bring all active meds with him and go into a consult slot for this purpose

## 2017-01-09 NOTE — Telephone Encounter (Signed)
Spoke with patient's mother. She stated that she would like for Mr. Berry to switch from BQ to MW. She said that she is not happy with BQ for "discontinuing his infusions and not being available in the office enough." She wants to switch to MW since she has heard that he is in the office daily. Advised her of the switching providers protocol, she verbalized understanding.     BQ, are you ok with him switching to MW?  MW, are willing to accept him as a patient?   Please advise. Thanks.

## 2017-01-09 NOTE — Telephone Encounter (Signed)
MW please advise if you're willing to take on patient. Thanks.

## 2017-01-09 NOTE — Telephone Encounter (Signed)
ATC, NA and no option to leave a msg, [facility]

## 2017-01-09 NOTE — Telephone Encounter (Signed)
OK by me 

## 2017-01-10 NOTE — Telephone Encounter (Signed)
lmtcb x1 for [employer].

## 2017-01-10 NOTE — Telephone Encounter (Signed)
Mother returned phone call..ert

## 2017-01-10 NOTE — Telephone Encounter (Signed)
lmtcb x1 for pt's mother, Otila Kluver.

## 2017-01-11 NOTE — Telephone Encounter (Signed)
lmtcb x2 for pt's mother, Otila Kluver.

## 2017-01-12 NOTE — Telephone Encounter (Signed)
Spoke with pt's mother, Otila Kluver. She is aware that MW is willing to see the pt. OV has been scheduled for 02/23/17 at 11:30am. Nothing further was needed.

## 2017-02-23 ENCOUNTER — Ambulatory Visit (INDEPENDENT_AMBULATORY_CARE_PROVIDER_SITE_OTHER): Payer: Medicaid Other | Admitting: Internal Medicine

## 2017-02-23 ENCOUNTER — Encounter: Payer: Self-pay | Admitting: Internal Medicine

## 2017-02-23 VITALS — BP 104/64 | HR 105 | Ht 65.5 in | Wt 100.6 lb

## 2017-02-23 DIAGNOSIS — J449 Chronic obstructive pulmonary disease, unspecified: Secondary | ICD-10-CM | POA: Diagnosis not present

## 2017-02-23 DIAGNOSIS — E8801 Alpha-1-antitrypsin deficiency: Secondary | ICD-10-CM | POA: Diagnosis not present

## 2017-02-23 MED ORDER — ALBUTEROL SULFATE HFA 108 (90 BASE) MCG/ACT IN AERS: 2.0000 | INHALATION_SPRAY | Freq: Four times a day (QID) | RESPIRATORY_TRACT | 2 refills | Status: DC | PRN

## 2017-02-23 MED ORDER — BUDESONIDE-FORMOTEROL FUMARATE 160-4.5 MCG/ACT IN AERO: 2.0000 | INHALATION_SPRAY | Freq: Two times a day (BID) | RESPIRATORY_TRACT | 11 refills | Status: DC

## 2017-02-23 MED ORDER — TIOTROPIUM BROMIDE MONOHYDRATE 2.5 MCG/ACT IN AERS: 2.0000 | INHALATION_SPRAY | Freq: Every day | RESPIRATORY_TRACT | 11 refills | Status: DC

## 2017-02-23 MED ORDER — BUDESONIDE-FORMOTEROL FUMARATE 160-4.5 MCG/ACT IN AERO: 2.0000 | INHALATION_SPRAY | Freq: Two times a day (BID) | RESPIRATORY_TRACT | 0 refills | Status: DC

## 2017-02-23 MED ORDER — TIOTROPIUM BROMIDE MONOHYDRATE 2.5 MCG/ACT IN AERS: 2.0000 | INHALATION_SPRAY | Freq: Every day | RESPIRATORY_TRACT | 0 refills | Status: DC

## 2017-02-23 MED ORDER — PREDNISONE 10 MG PO TABS: ORAL_TABLET | ORAL | 0 refills | Status: DC

## 2017-02-23 MED ORDER — AZITHROMYCIN 250 MG PO TABS: ORAL_TABLET | ORAL | 0 refills | Status: DC

## 2017-02-23 NOTE — Progress Notes (Signed)
Subjective:    Patient ID: [name]., male    DOB: 05-19-1981, 36 y.o.   MRN: [id number]  Synopsis: Referred in January 2018 for COPD. Found to have alpha-1 antitrypsin deficiency.   Primary care is [name] in [name]   ov [name] 11/07/16  When [name] took his last treatment of prolastin he had nausea, vomiting, and muscle cramps which started 1 hour after treatment and lasted for about 48 hours.  On the third treatment he says that he was bent over on the bed and he had a lot of shaking.    He has had four total treatments.   He feels like his breathing is better.  His family feels that the hospital may have mixed up the medication.  He didn't receive the 4th infusion because of his reaction to the medicine. The last treatment was October 20 2016 but no better  Since then his breathing has worsened.  He is having tightness in his chest and dyspnea.  He needs refills on his albuterol HFA.  He is out of his advair and "pretty much anything" for at least x one week prior . He is still smoking, smoking 1/3 pack per day.   rec Tobacco abuse: Quit smoking COPD: Take Advair twice a day Takes Spiriva once daily Use Proventil as needed for shortness of breath For alpha-1 antitrypsin deficiency: We will reach out to the company who makes this medicine to discuss the reaction even having with their pharmacist In the meantime we will not prescribe this further until we have clear guidance from them in terms of how to manage side effects      02/23/2017 1st  office visit/ [name]  Re transition of care  Chief Complaint  Patient presents with  . Pulmonary Consult    Switching from [name] for eval of Alpha 1 Def. Pt states his breathing seems worse since the last visit.  He is having alot of trouble in the am when he wakes up. He states coughing up dark green sputum.    best days = slow pace at [employer] slow pace leaning bugging s 02 / still have issues at night  with noct cough  Last ok about a couple of weeks prior to OV  And better p prednisone each time Starts every day with symbicort 160 x one pff / and spiriva both forms  Quit smoking x one month prior to OV    No obvious patterns day to day or daytime variability or assoc   mucus plugs or hemoptysis or cp or chest tightness, subjective wheeze or overt sinus or hb symptoms. No unusual exp hx or h/o childhood pna/ asthma or knowledge of premature birth.  Sleeping ok without nocturnal    exacerbation  of respiratory  c/o's or need for noct saba but much worse on waking. Also denies any obvious fluctuation of symptoms with weather or environmental changes or other aggravating or alleviating factors except as outlined above   Current Medications, Allergies, Complete Past Medical History, Past Surgical History, Family History, and Social History were reviewed in [employer] record.  ROS  The following are not active complaints unless bolded sore throat, dysphagia, dental problems, itching, sneezing,  nasal congestion or excess/ purulent secretions, ear ache,   fever, chills, sweats, unintended wt loss, classically pleuritic or exertional cp,  orthopnea pnd or leg swelling, presyncope, palpitations, abdominal pain, anorexia, nausea, vomiting, diarrhea  or change  in bowel or bladder habits, change in stools or urine, dysuria,hematuria,  rash, arthralgias, visual complaints, headache, numbness, weakness or ataxia or problems with walking or coordination,  change in mood/affect or memory.                    Objective:   Physical Exam  Disheveled thin am wm > stated age  Wt Readings from Last 3 Encounters:  02/23/17 100 lb 9.6 oz (45.6 kg)  12/29/16 95 lb (43.1 kg)  11/07/16 94 lb (42.6 kg)    Vital signs reviewed  - Note on arrival 02 sats  95% on RA   HEENT: nl dentition, turbinates bilaterally, and oropharynx. Nl external ear canals without cough reflex   NECK :   without JVD/Nodes/TM/ nl carotid upstrokes bilaterally   LUNGS: no acc muscle use, slt barrel chest with pan exp wheeze def improved p saba x 2 pffs hfa   CV:  RRR  no s3 or murmur or increase in P2, and no edema   ABD:  soft and nontender with nl inspiratory excursion in the supine position. No bruits or organomegaly appreciated, bowel sounds nl  MS:  Nl gait/ ext warm without deformities, calf tenderness, cyanosis or clubbing No obvious joint restrictions   SKIN: warm and dry without lesions    NEURO:  alert, approp, nl sensorium with  no motor or cerebellar deficits apparent.        I personally reviewed images and agree with radiology impression as follows:  CXR:    12/29/16 Stable hyperinflation.  No interval change or acute process  My impression:  Classic alpha one pattern, hyperinflation  much worse in bases         Assessment & Plan:

## 2017-02-23 NOTE — Patient Instructions (Addendum)
Plan A = Automatic =  Symbicort 160 Take 2 puffs first thing in am/ spiriva x 2 puffs  and then another 2 puffs about 12 hours later   Work on inhaler technique:  relax and gently blow all the way out then take a nice smooth deep breath back in, triggering the inhaler at same time you start breathing in.  Hold for up to 5 seconds if you can. Blow out thru nose. Rinse and gargle with water when done     Plan B = Backup Only use your albuterol (Proair) as a rescue medication to be used if you can't catch your breath by resting or doing a relaxed purse lip breathing pattern.  - The less you use it, the better it will work when you need it. - Ok to use the inhaler up to 2 puffs  every 4 hours if you must but call for appointment if use goes up over your usual need - Don't leave home without it !!  (think of it like the spare tire for your car)   Plan C = Crisis - only use your albuterol nebulizer if you first try Plan B and it fails to help > ok to use the nebulizer up to every 4 hours but if start needing it regularly call for immediate appointment   Stay off the cigarettes and the nicotine and we will check you for nicotine in your urine when you return to see if we can qualify you to restart the prolastin   zpak  Prednisone 10 mg take  4 each am x 2 days,   2 each am x 2 days,  1 each am x 2 days and stop    Please schedule a follow up office visit in 4 weeks, sooner if needed  with all medications /inhalers/ solutions in hand so we can verify exactly what you are taking. This includes all medications from all doctors and over the counters  Add: consider adding flutter next ov/ sinus ct if purulent sputum persists and recheck spirometry p am meds

## 2017-02-25 NOTE — Assessment & Plan Note (Addendum)
07/26/2016 Blood work =  ZZ / level 25  - attempted replacement but could not tolerate prolastin last dose October 20 2016   Will explore whether alternatives to prolastin might be appropriate but would insist his urine nicotine metabolites be negative first / explained

## 2017-02-25 NOTE — Assessment & Plan Note (Addendum)
Spirometry 02/23/2017  FEV1 0.96 (26%)  Ratio 38  - 02/23/2017  After extensive coaching HFA effectiveness =    75% from a baseline of <50%    He clearly has severe copd though I suspect more of an AB component than one typically sees in this pattern of copd based on how much better his wheezing was on exam and reported response to steroids  DDX of  difficult airways management almost all start with A and  include Adherence, Ace Inhibitors, Acid Reflux, Active Sinus Disease, Alpha 1 Antitripsin deficiency, Anxiety masquerading as Airways dz,  ABPA,  Allergy(esp in young), Aspiration (esp in elderly), Adverse effects of meds,  Active smokers, A bunch of PE's (a small clot burden can't cause this syndrome unless there is already severe underlying pulm or vascular dz with poor reserve) plus two Bs  = Bronchiectasis and Beta blocker use..and one C= CHF  Adherence is always the initial "prime suspect" and is a multilayered concern that requires a "trust but verify" approach in every patient - starting with knowing how to use medications, especially inhalers, correctly, keeping up with refills and understanding the fundamental difference between maintenance and prns vs those medications only taken for a very short course and then stopped and not refilled.  - see hfa teaching - will insist her return with all meds in hand using a trust but verify approach to confirm accurate Medication  Reconciliation The principal here is that until we are certain that the  patients are doing what we've asked, it makes no sense to ask them to do more.    ? Allergy/asthma component > Prednisone 10 mg take  4 each am x 2 days,   2 each am x 2 days,  1 each am x 2 days and stop / high dose ICS = symb 2 q 12h   ?active smoking>  Check nicotine metabolites next ov/ d/c all nicotine products in meantime   ? Adverse effects of meds, esp dpi's > change to hfa/smi and neb, d/c all dpi's  Alpha One AT > see sep a/p   Active sinus  dz > consider sinus ct if purulent sputum still reported p abx rx    ? Bronchiectasis also > may consider hrct but really this is a moot issue for now   I had an extended discussion with the patient reviewing all relevant studies completed to date and  lasting 25 minutes of a 40  minute transition of care office visit with pt new to me    re  severe non-specific but potentially very serious refractory respiratory symptoms of uncertain and potentially multiple  etiologies.   Each maintenance medication was reviewed in detail including most importantly the difference between maintenance and prns and under what circumstances the prns are to be triggered using an action plan format that is not reflected in the computer generated alphabetically organized AVS.    Please see AVS for specific instructions unique to this office visit that I personally wrote and verbalized to the the pt in detail and then reviewed with pt  by my nurse highlighting any changes in therapy/plan of care  recommended at today's visit.

## 2017-02-27 ENCOUNTER — Other Ambulatory Visit: Payer: Self-pay

## 2017-02-27 ENCOUNTER — Ambulatory Visit: Payer: Medicaid Other | Attending: Family Medicine | Admitting: Family Medicine

## 2017-02-27 ENCOUNTER — Encounter: Payer: Self-pay | Admitting: Family Medicine

## 2017-02-27 VITALS — BP 132/87 | HR 116 | Temp 98.6°F | Resp 20 | Ht 65.0 in | Wt 99.4 lb

## 2017-02-27 DIAGNOSIS — Z7951 Long term (current) use of inhaled steroids: Secondary | ICD-10-CM | POA: Insufficient documentation

## 2017-02-27 DIAGNOSIS — G629 Polyneuropathy, unspecified: Secondary | ICD-10-CM | POA: Diagnosis not present

## 2017-02-27 DIAGNOSIS — E43 Unspecified severe protein-calorie malnutrition: Secondary | ICD-10-CM | POA: Diagnosis not present

## 2017-02-27 DIAGNOSIS — F1910 Other psychoactive substance abuse, uncomplicated: Secondary | ICD-10-CM

## 2017-02-27 DIAGNOSIS — K6389 Other specified diseases of intestine: Secondary | ICD-10-CM | POA: Diagnosis not present

## 2017-02-27 DIAGNOSIS — Z888 Allergy status to other drugs, medicaments and biological substances status: Secondary | ICD-10-CM | POA: Diagnosis not present

## 2017-02-27 DIAGNOSIS — K50919 Crohn's disease, unspecified, with unspecified complications: Secondary | ICD-10-CM | POA: Diagnosis not present

## 2017-02-27 DIAGNOSIS — R9431 Abnormal electrocardiogram [ECG] [EKG]: Secondary | ICD-10-CM

## 2017-02-27 DIAGNOSIS — J449 Chronic obstructive pulmonary disease, unspecified: Secondary | ICD-10-CM | POA: Diagnosis not present

## 2017-02-27 DIAGNOSIS — Z87442 Personal history of urinary calculi: Secondary | ICD-10-CM | POA: Insufficient documentation

## 2017-02-27 DIAGNOSIS — E8801 Alpha-1-antitrypsin deficiency: Secondary | ICD-10-CM

## 2017-02-27 DIAGNOSIS — F419 Anxiety disorder, unspecified: Secondary | ICD-10-CM | POA: Insufficient documentation

## 2017-02-27 DIAGNOSIS — Z79899 Other long term (current) drug therapy: Secondary | ICD-10-CM | POA: Diagnosis not present

## 2017-02-27 DIAGNOSIS — F3177 Bipolar disorder, in partial remission, most recent episode mixed: Secondary | ICD-10-CM | POA: Diagnosis not present

## 2017-02-27 DIAGNOSIS — Z880 Allergy status to penicillin: Secondary | ICD-10-CM | POA: Diagnosis not present

## 2017-02-27 DIAGNOSIS — R0789 Other chest pain: Secondary | ICD-10-CM | POA: Diagnosis not present

## 2017-02-27 MED ORDER — METHYLPREDNISOLONE SODIUM SUCC 125 MG IJ SOLR
62.2500 mg | Freq: Once | INTRAMUSCULAR | Status: AC
  Administered 2017-02-27: 62.25 mg via INTRAMUSCULAR

## 2017-02-27 MED ORDER — GABAPENTIN 300 MG PO CAPS: 600.0000 mg | ORAL_CAPSULE | Freq: Two times a day (BID) | ORAL | 1 refills | Status: DC

## 2017-02-27 MED ORDER — ONDANSETRON 4 MG PO TBDP: 4.0000 mg | ORAL_TABLET | Freq: Three times a day (TID) | ORAL | 1 refills | Status: DC | PRN

## 2017-02-27 NOTE — Patient Instructions (Signed)
Chronic Obstructive Pulmonary Disease Chronic obstructive pulmonary disease (COPD) is a common lung condition in which airflow from the lungs is limited. COPD is a general term that can be used to describe many different lung problems that limit airflow, including both chronic bronchitis and emphysema. If you have COPD, your lung function will probably never return to normal, but there are measures you can take to improve lung function and make yourself feel better. What are the causes?  Smoking (common).  Exposure to secondhand smoke.  Genetic problems.  Chronic inflammatory lung diseases or recurrent infections. What are the signs or symptoms?  Shortness of breath, especially with physical activity.  Deep, persistent (chronic) cough with a large amount of thick mucus.  Wheezing.  Rapid breaths (tachypnea).  Gray or bluish discoloration (cyanosis) of the skin, especially in your fingers, toes, or lips.  Fatigue.  Weight loss.  Frequent infections or episodes when breathing symptoms become much worse (exacerbations).  Chest tightness. How is this diagnosed? Your health care provider will take a medical history and perform a physical examination to diagnose COPD. Additional tests for COPD may include:  Lung (pulmonary) function tests.  Chest X-ray.  CT scan.  Blood tests. How is this treated? Treatment for COPD may include:  Inhaler and nebulizer medicines. These help manage the symptoms of COPD and make your breathing more comfortable.  Supplemental oxygen. Supplemental oxygen is only helpful if you have a low oxygen level in your blood.  Exercise and physical activity. These are beneficial for nearly all people with COPD.  Lung surgery or transplant.  Nutrition therapy to gain weight, if you are underweight.  Pulmonary rehabilitation. This may involve working with a team of health care providers and specialists, such as respiratory, occupational, and physical  therapists. Follow these instructions at home:  Take all medicines (inhaled or pills) as directed by your health care provider.  Avoid over-the-counter medicines or cough syrups that dry up your airway (such as antihistamines) and slow down the elimination of secretions unless instructed otherwise by your health care provider.  If you are a smoker, the most important thing that you can do is stop smoking. Continuing to smoke will cause further lung damage and breathing trouble. Ask your health care provider for help with quitting smoking. He or she can direct you to community resources or hospitals that provide support.  Avoid exposure to irritants such as smoke, chemicals, and fumes that aggravate your breathing.  Use oxygen therapy and pulmonary rehabilitation if directed by your health care provider. If you require home oxygen therapy, ask your health care provider whether you should purchase a pulse oximeter to measure your oxygen level at home.  Avoid contact with individuals who have a contagious illness.  Avoid extreme temperature and humidity changes.  Eat healthy foods. Eating smaller, more frequent meals and resting before meals may help you maintain your strength.  Stay active, but balance activity with periods of rest. Exercise and physical activity will help you maintain your ability to do things you want to do.  Preventing infection and hospitalization is very important when you have COPD. Make sure to receive all the vaccines your health care provider recommends, especially the pneumococcal and influenza vaccines. Ask your health care provider whether you need a pneumonia vaccine.  Learn and use relaxation techniques to manage stress.  Learn and use controlled breathing techniques as directed by your health care provider. Controlled breathing techniques include: 1. Pursed lip breathing. Start by breathing in (inhaling)   through your nose for 1 second. Then, purse your lips as  if you were going to whistle and breathe out (exhale) through the pursed lips for 2 seconds. 2. Diaphragmatic breathing. Start by putting one hand on your abdomen just above your waist. Inhale slowly through your nose. The hand on your abdomen should move out. Then purse your lips and exhale slowly. You should be able to feel the hand on your abdomen moving in as you exhale.  Learn and use controlled coughing to clear mucus from your lungs. Controlled coughing is a series of short, progressive coughs. The steps of controlled coughing are: 1. Lean your head slightly forward. 2. Breathe in deeply using diaphragmatic breathing. 3. Try to hold your breath for 3 seconds. 4. Keep your mouth slightly open while coughing twice. 5. Spit any mucus out into a tissue. 6. Rest and repeat the steps once or twice as needed. Contact a health care provider if:  You are coughing up more mucus than usual.  There is a change in the color or thickness of your mucus.  Your breathing is more labored than usual.  Your breathing is faster than usual. Get help right away if:  You have shortness of breath while you are resting.  You have shortness of breath that prevents you from:  Being able to talk.  Performing your usual physical activities.  You have chest pain lasting longer than 5 minutes.  Your skin color is more cyanotic than usual.  You measure low oxygen saturations for longer than 5 minutes with a pulse oximeter. This information is not intended to replace advice given to you by your health care provider. Make sure you discuss any questions you have with your health care provider. Document Released: 04/19/2005 Document Revised: 12/16/2015 Document Reviewed: 03/06/2013 Elsevier Interactive Patient Education  2017 Elsevier Inc.  

## 2017-02-27 NOTE — Progress Notes (Signed)
Request medication: gabapentin  And inhalers

## 2017-02-27 NOTE — Progress Notes (Signed)
Subjective:  Patient ID: Clifford Sexton., male    DOB: [birth year]  Age: 36 y.o. MRN: [id number]  CC: Establish care  HPI [name]. is a 36 year old male who was previously followed by Dr. Clide Dales who is no longer with the practice and comes in here to establish care with me. His medical history significant for bipolar disorder, Crohn's disease (status post ileocolectomy with anastomosis in 2007), protein calorie malnutrition, hepatitis C, COPD, alpha1 antitrypsin deficiency, polysubstance abuse (previously on Suboxone - which he was receiving from [facility] of care until 10/2016).  He currently does not receive mental health care as he informs me he no longer goes to [employer] of care as Dr Mirna Mires only managed his Suboxone but not his bipolar disorder. Denies suicidal ideation or intent.  He has no abdominal pain, diarrhea, nausea or vomiting. He informs me today he has experienced some chest tightness and associated shortness of breath. Review of his chart indicates that at his recent visit with pulmonology, 4 days ago he was placed on azithromycin and prednisone taper. He has completed the azithromycin will be taking the last dose of prednisone today. EKG performed in the clinic reveals questionable atrial flutter. He has a history of polysubstance abuse and recently used a few days ago.  Also complains of shocklike sensations in his neck and his back and he is requesting a refill of gabapentin. He was referred to wait for his [facility] spine surgeon and is waiting for an appointment.  Accompanied by his mom for today's visit.  Past Medical History:  Diagnosis Date  . Anxiety   . Asthma    mild   . Bipolar 1 disorder (HCC)    Self laceration and self harm behaviors   . COPD (chronic obstructive pulmonary disease) ([place])   . Crohn disease ([place]) Dx 2004   terminal ileum Crohn's s/p ileocolectomy with anastamosis  . Depression   . Kidney stones   .  Substance abuse     Past Surgical History:  Procedure Laterality Date  . COLON SURGERY  01/17/2006  . ESOPHAGOGASTRODUODENOSCOPY  09/04/2011   Procedure: ESOPHAGOGASTRODUODENOSCOPY (EGD);  Surgeon: Estanislado Emms., MD,FACG;  Location: [facility];  Service: Endoscopy;  Laterality: N/A;  . INGUINAL HERNIA REPAIR     On the right and the left   . TONSILLECTOMY     20 yrs ago    Allergies  Allergen Reactions  . Penicillins Anaphylaxis and Swelling    Has patient had a PCN reaction causing immediate rash, facial/tongue/throat swelling, SOB or lightheadedness with hypotension: Yes Has patient had a PCN reaction causing severe rash involving mucus membranes or skin necrosis: No Has patient had a PCN reaction that required hospitalization No Has patient had a PCN reaction occurring within the last 10 years: No If all of the above answers are "NO", then may proceed with Cephalosporin use.   . Diphenhydramine Hcl Hives and Rash     Outpatient Medications Prior to Visit  Medication Sig Dispense Refill  . albuterol (PROAIR HFA) 108 (90 Base) MCG/ACT inhaler Inhale 2 puffs into the lungs every 6 (six) hours as needed for wheezing or shortness of breath. 1 Inhaler 2  . albuterol (PROVENTIL) (2.5 MG/3ML) 0.083% nebulizer solution Take 3 mLs (2.5 mg total) by nebulization every 6 (six) hours as needed for wheezing or shortness of breath. 75 mL 12  . budesonide-formoterol (SYMBICORT) 160-4.5 MCG/ACT inhaler Inhale 2 puffs into the lungs 2 (two) times daily.  1 Inhaler 11  . predniSONE (DELTASONE) 10 MG tablet Take  4 each am x 2 days,   2 each am x 2 days,  1 each am x 2 days and stop 14 tablet 0  . Tiotropium Bromide Monohydrate (SPIRIVA RESPIMAT) 2.5 MCG/ACT AERS Inhale 2 puffs into the lungs daily. 1 Inhaler 11  . gabapentin (NEURONTIN) 300 MG capsule Take 2 capsules (600 mg total) by mouth 2 (two) times daily. 120 capsule 0  . ondansetron (ZOFRAN) 8 MG tablet Take 1 tablet (8 mg total) by  mouth every 8 (eight) hours as needed for nausea or vomiting. 24 tablet 2  . clonazePAM (KLONOPIN) 0.5 MG tablet Take 0.5 mg by mouth 2 (two) times daily.    [name] Kitchen azithromycin (ZITHROMAX) 250 MG tablet Take 2 on day one then 1 daily x 4 days 6 tablet 0  . budesonide-formoterol (SYMBICORT) 160-4.5 MCG/ACT inhaler Inhale 2 puffs into the lungs 2 (two) times daily. 1 Inhaler 0  . promethazine (PHENERGAN) 25 MG tablet Take 25 mg by mouth 3 (three) times daily as needed for nausea or vomiting.   3  . Tiotropium Bromide Monohydrate (SPIRIVA RESPIMAT) 2.5 MCG/ACT AERS Inhale 2 puffs into the lungs daily. 1 Inhaler 0   No facility-administered medications prior to visit.     ROS Review of Systems  Constitutional: Negative for activity change and appetite change.  HENT: Negative for sinus pressure and sore throat.   Eyes: Negative for visual disturbance.  Respiratory: Positive for chest tightness. Negative for cough and shortness of breath.   Cardiovascular: Negative for chest pain and leg swelling.  Gastrointestinal: Negative for abdominal distention, abdominal pain, constipation and diarrhea.  Endocrine: Negative.   Genitourinary: Negative for dysuria.  Musculoskeletal: Negative for joint swelling and myalgias.  Skin: Negative for rash.  Allergic/Immunologic: Negative.   Neurological: Negative for weakness, light-headedness and numbness.  Psychiatric/Behavioral: Negative for dysphoric mood and suicidal ideas.    Objective:  BP 132/87 (BP Location: Right Arm, Patient Position: Sitting, Cuff Size: Small)   Pulse (!) 116   Temp 98.6 F (37 C) (Oral)   Resp 20   Ht 5' 5"  (1.651 m)   Wt 99 lb 6.4 oz (45.1 kg)   SpO2 96%   BMI 16.54 kg/m   BP/Weight 02/27/2017 0/03/6044 4/0/9811  Systolic BP 914 782 956  Diastolic BP 87 64 94  Wt. (Lbs) 99.4 100.6 95  BMI 16.54 16.49 15.81      Physical Exam  Constitutional: He is oriented to person, place, and time.  Chronically ill looking,  malnourished  Neck: No JVD present.  Cardiovascular: Normal rate, normal heart sounds and intact distal pulses.   No murmur heard. Pulmonary/Chest: Effort normal. He has wheezes. He has no rales. He exhibits no tenderness.  Abdominal: Soft. Bowel sounds are normal. He exhibits no distension and no mass. There is no tenderness.  Musculoskeletal: Normal range of motion.  Neurological: He is alert and oriented to person, place, and time.  Skin: Skin is warm and dry.  Psychiatric: He has a normal mood and affect.     Assessment & Plan:   1. Crohn's disease with complication, unspecified gastrointestinal tract location ([place]) No acute flare Previously followed by GI  2. Protein-calorie malnutrition, severe ([place]) Advised to increase intake of high caloric foods  3. Bipolar disorder, in partial remission, most recent episode mixed [facility]) Currently on medications Previously followed by [employer] of Care Referred to psych  4. Alpha-1-antitrypsin deficiency ([place]) Unable  to tolerate Prolastin Management as per pulmonary  5. Substance abuse Actively using Strongly discouraged this  6. Neuropathy Takes gabapentin chronically  7. Chest tightness Likely COPD exacerbation Completed course of azithromycin and prednisone taper I am Solu-Medrol today in the clinic If symptoms persist he is to notify his pulmonologist AT the ED.  8. Abnormal EKG EKG Is questionable for atrial flutter with variable AV block ; HR 106  This is new compared to EKG from 2 months prior ? Factor recent polysubstance use We'll repeat EKG at next visit   Meds ordered this encounter  Medications  . gabapentin (NEURONTIN) 300 MG capsule    Sig: Take 2 capsules (600 mg total) by mouth 2 (two) times daily.    Dispense:  120 capsule    Refill:  1  . ondansetron (ZOFRAN ODT) 4 MG disintegrating tablet    Sig: Take 1 tablet (4 mg total) by mouth every 8 (eight) hours as needed for nausea or vomiting.     Dispense:  40 tablet    Refill:  1    Follow-up: Return in about 2 weeks (around 03/13/2017) for Follow-up on chest tightness and abnormal EKG.   This note has been created with Surveyor, quantity. Any transcriptional errors are unintentional.     Arnoldo Morale MD

## 2017-03-01 ENCOUNTER — Emergency Department (HOSPITAL_COMMUNITY)
Admission: EM | Admit: 2017-03-01 | Discharge: 2017-03-02 | Disposition: A | Discharge status: Home / Self Care | Payer: Medicaid Other | Attending: Emergency Medicine | Admitting: Emergency Medicine

## 2017-03-01 ENCOUNTER — Encounter (HOSPITAL_COMMUNITY): Payer: Self-pay

## 2017-03-01 ENCOUNTER — Emergency Department (HOSPITAL_COMMUNITY): Payer: Medicaid Other

## 2017-03-01 ENCOUNTER — Encounter: Payer: Self-pay | Admitting: Family Medicine

## 2017-03-01 DIAGNOSIS — Z79899 Other long term (current) drug therapy: Secondary | ICD-10-CM | POA: Diagnosis not present

## 2017-03-01 DIAGNOSIS — J45909 Unspecified asthma, uncomplicated: Secondary | ICD-10-CM | POA: Insufficient documentation

## 2017-03-01 DIAGNOSIS — J449 Chronic obstructive pulmonary disease, unspecified: Secondary | ICD-10-CM | POA: Insufficient documentation

## 2017-03-01 DIAGNOSIS — J189 Pneumonia, unspecified organism: Secondary | ICD-10-CM | POA: Diagnosis not present

## 2017-03-01 DIAGNOSIS — F17210 Nicotine dependence, cigarettes, uncomplicated: Secondary | ICD-10-CM | POA: Diagnosis not present

## 2017-03-01 DIAGNOSIS — J441 Chronic obstructive pulmonary disease with (acute) exacerbation: Secondary | ICD-10-CM | POA: Diagnosis not present

## 2017-03-01 DIAGNOSIS — R0602 Shortness of breath: Secondary | ICD-10-CM | POA: Diagnosis present

## 2017-03-01 LAB — CBC WITH DIFFERENTIAL/PLATELET
BASOS PCT: 0 %
Basophils Absolute: 0 10*3/uL (ref 0.0–0.1)
Eosinophils Absolute: 0.1 10*3/uL (ref 0.0–0.7)
Eosinophils Relative: 1 %
HEMATOCRIT: 41.5 % (ref 39.0–52.0)
HEMOGLOBIN: 14.4 g/dL (ref 13.0–17.0)
LYMPHS PCT: 17 %
Lymphs Abs: 2.1 10*3/uL (ref 0.7–4.0)
MCH: 33.3 pg (ref 26.0–34.0)
MCHC: 34.7 g/dL (ref 30.0–36.0)
MCV: 95.8 fL (ref 78.0–100.0)
MONO ABS: 0.4 10*3/uL (ref 0.1–1.0)
MONOS PCT: 3 %
NEUTROS ABS: 9.6 10*3/uL — AB (ref 1.7–7.7)
NEUTROS PCT: 79 %
Platelets: 222 10*3/uL (ref 150–400)
RBC: 4.33 MIL/uL (ref 4.22–5.81)
RDW: 13.5 % (ref 11.5–15.5)
WBC: 12.2 10*3/uL — ABNORMAL HIGH (ref 4.0–10.5)

## 2017-03-01 LAB — COMPREHENSIVE METABOLIC PANEL
ALBUMIN: 3.7 g/dL (ref 3.5–5.0)
ALT: 27 U/L (ref 17–63)
ANION GAP: 8 (ref 5–15)
AST: 30 U/L (ref 15–41)
Alkaline Phosphatase: 65 U/L (ref 38–126)
BUN: 11 mg/dL (ref 6–20)
CALCIUM: 8.1 mg/dL — AB (ref 8.9–10.3)
CHLORIDE: 105 mmol/L (ref 101–111)
CO2: 25 mmol/L (ref 22–32)
Creatinine, Ser: 1.01 mg/dL (ref 0.61–1.24)
GFR calc non Af Amer: >60 mL/min (ref >60)
GLUCOSE: 86 mg/dL (ref 65–99)
POTASSIUM: 3.7 mmol/L (ref 3.5–5.1)
SODIUM: 138 mmol/L (ref 135–145)
Total Bilirubin: 0.5 mg/dL (ref 0.3–1.2)
Total Protein: 6.3 g/dL — ABNORMAL LOW (ref 6.5–8.1)

## 2017-03-01 LAB — I-STAT CG4 LACTIC ACID, ED: LACTIC ACID, VENOUS: 1.1 mmol/L (ref 0.5–1.9)

## 2017-03-01 LAB — URINALYSIS, ROUTINE W REFLEX MICROSCOPIC
BILIRUBIN URINE: NEGATIVE
Glucose, UA: NEGATIVE mg/dL
Hgb urine dipstick: NEGATIVE
KETONES UR: NEGATIVE mg/dL
LEUKOCYTES UA: NEGATIVE
NITRITE: NEGATIVE
Protein, ur: NEGATIVE mg/dL
SPECIFIC GRAVITY, URINE: 1.011 (ref 1.005–1.030)
pH: 5 (ref 5.0–8.0)

## 2017-03-01 MED ORDER — IPRATROPIUM-ALBUTEROL 0.5-2.5 (3) MG/3ML IN SOLN: 3.0000 mL | Freq: Once | RESPIRATORY_TRACT | Status: DC

## 2017-03-01 MED ORDER — DIAZEPAM 2 MG PO TABS
2.0000 mg | ORAL_TABLET | Freq: Once | ORAL | Status: AC
  Administered 2017-03-01: 2 mg via ORAL
  Filled 2017-03-01: qty 1

## 2017-03-01 MED ORDER — IBUPROFEN 200 MG PO TABS
600.0000 mg | ORAL_TABLET | Freq: Once | ORAL | Status: AC
  Administered 2017-03-01: 600 mg via ORAL
  Filled 2017-03-01: qty 3

## 2017-03-01 MED ORDER — ALBUTEROL SULFATE HFA 108 (90 BASE) MCG/ACT IN AERS
4.0000 | INHALATION_SPRAY | RESPIRATORY_TRACT | Status: AC
  Administered 2017-03-02: 4 via RESPIRATORY_TRACT
  Filled 2017-03-01: qty 6.7

## 2017-03-01 MED ORDER — LEVOFLOXACIN 750 MG PO TABS
750.0000 mg | ORAL_TABLET | Freq: Once | ORAL | Status: AC
  Administered 2017-03-02: 750 mg via ORAL
  Filled 2017-03-01: qty 1

## 2017-03-01 NOTE — ED Notes (Signed)
Bed: QO30 Expected date:  Expected time:  Means of arrival:  Comments: EMS 36 yo male asthma-on 2nd neb treatment

## 2017-03-01 NOTE — ED Provider Notes (Addendum)
[facility] DEPT Provider Note   CSN: [id number] Arrival date & time: 03/01/17  1959     History   Chief Complaint Chief Complaint  Patient presents with  . Shortness of Breath    HPI [name]. is a 36 y.o. male.  The history is provided by the patient.  Shortness of Breath  This is a recurrent problem. The average episode lasts 2 hours. The problem occurs continuously.The problem has been gradually worsening. Associated symptoms include a fever (101 for 2 days.), rhinorrhea, cough and sputum production. He has tried beta-agonist inhalers for the symptoms. Associated medical issues include COPD (due anti-trypsin 1 COPD), pneumonia and chronic lung disease.   Patient reports being treated for presumed pneumonia last week with azithromycin. He reports that he started having fevers after completion of his antibiotic course 2 days ago.   Past Medical History:  Diagnosis Date  . Anxiety   . Asthma    mild   . Bipolar 1 disorder (HCC)    Self laceration and self harm behaviors   . COPD (chronic obstructive pulmonary disease) ([place])   . Crohn disease ([place]) Dx 2004   terminal ileum Crohn's s/p ileocolectomy with anastamosis  . Depression   . Kidney stones   . Substance abuse     Patient Active Problem List   Diagnosis Date Noted  . Neuropathy 02/27/2017  . COPD with AB component  08/08/2016  . Alpha-1-antitrypsin deficiency ([place]) 08/03/2016  . Diarrhea   . Crohn's disease with complication ([place]) 76/22/6333  . Depression 02/27/2016  . Substance abuse 02/27/2016  . Influenza with pneumonia 10/07/2014  . Protein-calorie malnutrition, severe ([place]) 10/06/2014  . Sepsis ([place]) 10/05/2014  . CAP (community acquired pneumonia) 10/05/2014  . Asthma 10/05/2014  . Generalized abdominal pain   . Hematemesis 09/04/2011  . ANEMIA 09/20/2009  . BIPOLAR DISORDER UNSPECIFIED 09/20/2009  . ADHD 09/20/2009  . 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 INTESTINAL  OBSTRUCTION, HX OF 09/20/2009  . COCAINE ABUSE, HX OF 09/20/2009    Past Surgical History:  Procedure Laterality Date  . COLON SURGERY  01/17/2006  . ESOPHAGOGASTRODUODENOSCOPY  09/04/2011   Procedure: ESOPHAGOGASTRODUODENOSCOPY (EGD);  Surgeon: Estanislado Emms., MD,FACG;  Location: [facility];  Service: Endoscopy;  Laterality: N/A;  . INGUINAL HERNIA REPAIR     On the right and the left   . TONSILLECTOMY     20 yrs ago       Home Medications    Prior to Admission medications   Medication Sig Start Date End Date Taking? Authorizing Provider  albuterol (PROAIR HFA) 108 (90 Base) MCG/ACT inhaler Inhale 2 puffs into the lungs every 6 (six) hours as needed for wheezing or shortness of breath. 02/23/17  Yes Tanda Rockers, MD  albuterol (PROVENTIL) (2.5 MG/3ML) 0.083% nebulizer solution Take 3 mLs (2.5 mg total) by nebulization every 6 (six) hours as needed for wheezing or shortness of breath. 12/29/16  Yes Caryl Ada K, PA-C  budesonide-formoterol (SYMBICORT) 160-4.5 MCG/ACT inhaler Inhale 2 puffs into the lungs 2 (two) times daily. 02/23/17  Yes Tanda Rockers, MD  clonazePAM (KLONOPIN) 0.5 MG tablet Take 0.5 mg by mouth 2 (two) times daily.   Yes [provider]  gabapentin (NEURONTIN) 300 MG capsule Take 2 capsules (600 mg total) by mouth 2 (two) times daily. 02/27/17  Yes Arnoldo Morale, MD  Tiotropium Bromide Monohydrate (SPIRIVA RESPIMAT) 2.5 MCG/ACT AERS Inhale 2 puffs into the lungs daily. 02/23/17  Yes Tanda Rockers,  MD  levofloxacin (LEVAQUIN) 750 MG tablet Take 1 tablet (750 mg total) by mouth daily. 03/02/17 03/07/17  Fatima Blank, MD  ondansetron (ZOFRAN ODT) 4 MG disintegrating tablet Take 1 tablet (4 mg total) by mouth every 8 (eight) hours as needed for nausea or vomiting. 02/27/17   Arnoldo Morale, MD  predniSONE (DELTASONE) 10 MG tablet Take  4 each am x 2 days,   2 each am x 2 days,  1 each am x 2 days and stop Patient not taking: Reported on 03/01/2017 02/23/17    Tanda Rockers, MD    Family History Family History  Problem Relation Age of Onset  . Hypertension Other   . Diabetes Other   . COPD Maternal Grandfather     Social History Social History  Substance Use Topics  . Smoking status: Current Some Day Smoker    Packs/day: 0.50    Years: 25.00    Types: Cigarettes    Last attempt to quit: 01/27/2017  . Smokeless tobacco: Never Used  . Alcohol use Yes     Comment: occsionally     Allergies   Penicillins and Diphenhydramine hcl   Review of Systems Review of Systems  Constitutional: Positive for fever (101 for 2 days.).  HENT: Positive for rhinorrhea.   Respiratory: Positive for cough, sputum production and shortness of breath.    All other systems are reviewed and are negative for acute change except as noted in the HPI   Physical Exam Updated Vital Signs BP 120/78 (BP Location: Right Arm)   Pulse (!) 111   Temp 98.2 F (36.8 C) (Rectal)   Resp 16   SpO2 94%   Physical Exam  Constitutional: He is oriented to person, place, and time. He appears well-developed and well-nourished. No distress.  HENT:  Head: Normocephalic and atraumatic.  Nose: Nose normal.  Eyes: Pupils are equal, round, and reactive to light. Conjunctivae and EOM are normal. Right eye exhibits no discharge. Left eye exhibits no discharge. No scleral icterus.  Neck: Normal range of motion. Neck supple.  Cardiovascular: Normal rate and regular rhythm.  Exam reveals no gallop and no friction rub.   No murmur heard. Pulmonary/Chest: Effort normal. No stridor. No respiratory distress. He has wheezes (diffuse; expiratory). He has no rales.  Abdominal: Soft. He exhibits no distension. There is no tenderness.  Musculoskeletal: He exhibits no edema or tenderness.  Neurological: He is alert and oriented to person, place, and time.  Skin: Skin is warm and dry. No rash noted. He is not diaphoretic. No erythema.  Psychiatric: He has a normal mood and affect.    Vitals reviewed.    ED Treatments / Results  Labs (all labs ordered are listed, but only abnormal results are displayed) Labs Reviewed  COMPREHENSIVE METABOLIC PANEL - Abnormal; Notable for the following:       Result Value   Calcium 8.1 (*)    Total Protein 6.3 (*)    All other components within normal limits  CBC WITH DIFFERENTIAL/PLATELET - Abnormal; Notable for the following:    WBC 12.2 (*)    Neutro Abs 9.6 (*)    All other components within normal limits  URINALYSIS, ROUTINE W REFLEX MICROSCOPIC  I-STAT CG4 LACTIC ACID, ED  I-STAT CG4 LACTIC ACID, ED    EKG  EKG Interpretation None       Radiology Dg Chest 2 View  Result Date: 03/01/2017 CLINICAL DATA:  Shortness of breath and wheezing. EXAM: CHEST  2  VIEW COMPARISON:  Chest radiograph 12/29/16 FINDINGS: The lungs are hyperexpanded. Blunting of the costophrenic angles is unchanged. No focal airspace consolidation or pulmonary edema. Normal cardiac silhouette. IMPRESSION: Hyperexpanded lungs, consistent with COPD. No acute airspace disease. Electronically Signed   By: Ulyses Jarred M.D.   On: 03/01/2017 21:21    Procedures Procedures (including critical care time)  Medications Ordered in ED Medications  levofloxacin (LEVAQUIN) tablet 750 mg (not administered)  albuterol (PROVENTIL HFA;VENTOLIN HFA) 108 (90 Base) MCG/ACT inhaler 4 puff (not administered)  ibuprofen (ADVIL,MOTRIN) tablet 600 mg (600 mg Oral Given 03/01/17 2125)  diazepam (VALIUM) tablet 2 mg (2 mg Oral Given 03/01/17 2139)     Initial Impression / Assessment and Plan / ED Course  I have reviewed the triage vital signs and the nursing notes.  Pertinent labs & imaging results that were available during my care of the patient were reviewed by me and considered in my medical decision making (see chart for details).     Presentation consistent with COPD exacerbation. After breathing treatment of patient's wheezing and was more focal on the right side.  Chest x-ray did not reveal evidence of pneumonia however given the leukocytosis, and recent treatment for clinical pneumonia with azithromycin, we'll treat the patient with Levaquin for presumed pneumonia.  Patient satting well on room air following breathing treatments.  The patient is safe for discharge with strict return precautions.   Final Clinical Impressions(s) / ED Diagnoses   Final diagnoses:  COPD exacerbation ([place])  Community acquired pneumonia, unspecified laterality   Disposition: Discharge  Condition: Good  I have discussed the results, Dx and Tx plan with the patient who expressed understanding and agree(s) with the plan. Discharge instructions discussed at great length. The patient was given strict return precautions who verbalized understanding of the instructions. No further questions at time of discharge.    New Prescriptions   LEVOFLOXACIN (LEVAQUIN) 750 MG TABLET    Take 1 tablet (750 mg total) by mouth daily.    Follow Up: Arnoldo Morale, MD [place] [id number] [phone]  Schedule an appointment as soon as possible for a visit  in 5-7 days      Ashyah Quizon, Grayce Sessions, MD 03/02/17 0021

## 2017-03-01 NOTE — ED Notes (Signed)
Pt transported to XR.  

## 2017-03-01 NOTE — ED Triage Notes (Signed)
Pt BIB [facility] c/o SOB. EMS reports that he was in the tripod position and initially satting at 84%. He reports that his SOB started 1 hour ago. Pt has been given 10 mg albuterol, 0.5 mg of atrovent, and 125 mg of solumedrol en route. Hx of Alpha 1, asthma, and anxiety. Pt still wheezing in triage.

## 2017-03-02 MED ORDER — HYDROXYZINE HCL 25 MG PO TABS: 25.0000 mg | ORAL_TABLET | Freq: Four times a day (QID) | ORAL | 0 refills | Status: AC | PRN

## 2017-03-02 MED ORDER — LEVOFLOXACIN 750 MG PO TABS: 750.0000 mg | ORAL_TABLET | Freq: Every day | ORAL | 0 refills | Status: AC

## 2017-03-13 ENCOUNTER — Ambulatory Visit: Payer: Medicaid Other | Admitting: Family Medicine

## 2017-03-22 ENCOUNTER — Telehealth (HOSPITAL_COMMUNITY): Payer: Self-pay

## 2017-03-27 ENCOUNTER — Ambulatory Visit: Payer: Medicaid Other | Admitting: Internal Medicine

## 2017-04-06 ENCOUNTER — Other Ambulatory Visit: Payer: Self-pay | Admitting: Family Medicine

## 2017-05-08 ENCOUNTER — Other Ambulatory Visit: Payer: Self-pay | Admitting: Internal Medicine

## 2017-05-12 ENCOUNTER — Other Ambulatory Visit: Payer: Self-pay | Admitting: Family Medicine

## 2017-06-03 ENCOUNTER — Other Ambulatory Visit: Payer: Self-pay | Admitting: Internal Medicine

## 2017-06-04 ENCOUNTER — Encounter (HOSPITAL_COMMUNITY): Payer: Self-pay | Admitting: Family Medicine

## 2017-06-04 ENCOUNTER — Other Ambulatory Visit: Payer: Self-pay

## 2017-06-04 ENCOUNTER — Emergency Department (HOSPITAL_COMMUNITY): Payer: Medicaid Other

## 2017-06-04 ENCOUNTER — Inpatient Hospital Stay (HOSPITAL_COMMUNITY)
Admission: EM | Admit: 2017-06-04 | Discharge: 2017-06-08 | DRG: 871 | Disposition: A | Discharge status: Home / Self Care | Payer: Medicaid Other | Attending: Internal Medicine | Admitting: Internal Medicine

## 2017-06-04 DIAGNOSIS — Z79899 Other long term (current) drug therapy: Secondary | ICD-10-CM | POA: Diagnosis not present

## 2017-06-04 DIAGNOSIS — Z825 Family history of asthma and other chronic lower respiratory diseases: Secondary | ICD-10-CM

## 2017-06-04 DIAGNOSIS — G629 Polyneuropathy, unspecified: Secondary | ICD-10-CM

## 2017-06-04 DIAGNOSIS — Z888 Allergy status to other drugs, medicaments and biological substances status: Secondary | ICD-10-CM

## 2017-06-04 DIAGNOSIS — F17210 Nicotine dependence, cigarettes, uncomplicated: Secondary | ICD-10-CM | POA: Diagnosis present

## 2017-06-04 DIAGNOSIS — F319 Bipolar disorder, unspecified: Secondary | ICD-10-CM | POA: Diagnosis present

## 2017-06-04 DIAGNOSIS — R0902 Hypoxemia: Secondary | ICD-10-CM | POA: Diagnosis present

## 2017-06-04 DIAGNOSIS — Z833 Family history of diabetes mellitus: Secondary | ICD-10-CM | POA: Diagnosis not present

## 2017-06-04 DIAGNOSIS — I10 Essential (primary) hypertension: Secondary | ICD-10-CM

## 2017-06-04 DIAGNOSIS — F419 Anxiety disorder, unspecified: Secondary | ICD-10-CM | POA: Diagnosis present

## 2017-06-04 DIAGNOSIS — F909 Attention-deficit hyperactivity disorder, unspecified type: Secondary | ICD-10-CM | POA: Diagnosis present

## 2017-06-04 DIAGNOSIS — J440 Chronic obstructive pulmonary disease with acute lower respiratory infection: Secondary | ICD-10-CM | POA: Diagnosis present

## 2017-06-04 DIAGNOSIS — F1490 Cocaine use, unspecified, uncomplicated: Secondary | ICD-10-CM | POA: Diagnosis present

## 2017-06-04 DIAGNOSIS — J9601 Acute respiratory failure with hypoxia: Secondary | ICD-10-CM | POA: Diagnosis present

## 2017-06-04 DIAGNOSIS — A419 Sepsis, unspecified organism: Secondary | ICD-10-CM | POA: Diagnosis not present

## 2017-06-04 DIAGNOSIS — J181 Lobar pneumonia, unspecified organism: Secondary | ICD-10-CM | POA: Diagnosis not present

## 2017-06-04 DIAGNOSIS — F1290 Cannabis use, unspecified, uncomplicated: Secondary | ICD-10-CM | POA: Diagnosis present

## 2017-06-04 DIAGNOSIS — Z7951 Long term (current) use of inhaled steroids: Secondary | ICD-10-CM | POA: Diagnosis not present

## 2017-06-04 DIAGNOSIS — E8801 Alpha-1-antitrypsin deficiency: Secondary | ICD-10-CM | POA: Diagnosis present

## 2017-06-04 DIAGNOSIS — E43 Unspecified severe protein-calorie malnutrition: Secondary | ICD-10-CM | POA: Diagnosis present

## 2017-06-04 DIAGNOSIS — Z7982 Long term (current) use of aspirin: Secondary | ICD-10-CM | POA: Diagnosis not present

## 2017-06-04 DIAGNOSIS — Z915 Personal history of self-harm: Secondary | ICD-10-CM

## 2017-06-04 DIAGNOSIS — Z880 Allergy status to penicillin: Secondary | ICD-10-CM | POA: Diagnosis not present

## 2017-06-04 DIAGNOSIS — K5000 Crohn's disease of small intestine without complications: Secondary | ICD-10-CM | POA: Diagnosis present

## 2017-06-04 DIAGNOSIS — J9691 Respiratory failure, unspecified with hypoxia: Secondary | ICD-10-CM

## 2017-06-04 DIAGNOSIS — F1910 Other psychoactive substance abuse, uncomplicated: Secondary | ICD-10-CM | POA: Diagnosis present

## 2017-06-04 DIAGNOSIS — J189 Pneumonia, unspecified organism: Secondary | ICD-10-CM

## 2017-06-04 DIAGNOSIS — Z87442 Personal history of urinary calculi: Secondary | ICD-10-CM | POA: Diagnosis not present

## 2017-06-04 DIAGNOSIS — Z8249 Family history of ischemic heart disease and other diseases of the circulatory system: Secondary | ICD-10-CM | POA: Diagnosis not present

## 2017-06-04 HISTORY — DX: Alpha-1-antitrypsin deficiency: E88.01

## 2017-06-04 LAB — COMPREHENSIVE METABOLIC PANEL
ALBUMIN: 3.1 g/dL — AB (ref 3.5–5.0)
ALK PHOS: 78 U/L (ref 38–126)
ALT: 35 U/L (ref 17–63)
AST: 35 U/L (ref 15–41)
Anion gap: 11 (ref 5–15)
BILIRUBIN TOTAL: 1.6 mg/dL — AB (ref 0.3–1.2)
BUN: 7 mg/dL (ref 6–20)
CALCIUM: 8.4 mg/dL — AB (ref 8.9–10.3)
CO2: 19 mmol/L — ABNORMAL LOW (ref 22–32)
Chloride: 102 mmol/L (ref 101–111)
Creatinine, Ser: 0.89 mg/dL (ref 0.61–1.24)
GFR calc Af Amer: >60 mL/min (ref >60)
GFR calc non Af Amer: >60 mL/min (ref >60)
GLUCOSE: 130 mg/dL — AB (ref 65–99)
Potassium: 3.5 mmol/L (ref 3.5–5.1)
Sodium: 132 mmol/L — ABNORMAL LOW (ref 135–145)
TOTAL PROTEIN: 6.1 g/dL — AB (ref 6.5–8.1)

## 2017-06-04 LAB — CBC WITH DIFFERENTIAL/PLATELET
BASOS ABS: 0 10*3/uL (ref 0.0–0.1)
Basophils Relative: 0 %
EOS PCT: 0 %
Eosinophils Absolute: 0 10*3/uL (ref 0.0–0.7)
HCT: 41.3 % (ref 39.0–52.0)
Hemoglobin: 14.6 g/dL (ref 13.0–17.0)
LYMPHS PCT: 14 %
Lymphs Abs: 1.3 10*3/uL (ref 0.7–4.0)
MCH: 33.5 pg (ref 26.0–34.0)
MCHC: 35.4 g/dL (ref 30.0–36.0)
MCV: 94.7 fL (ref 78.0–100.0)
MONO ABS: 0.8 10*3/uL (ref 0.1–1.0)
Monocytes Relative: 9 %
Neutro Abs: 7.1 10*3/uL (ref 1.7–7.7)
Neutrophils Relative %: 77 %
PLATELETS: 167 10*3/uL (ref 150–400)
RBC: 4.36 MIL/uL (ref 4.22–5.81)
RDW: 12.9 % (ref 11.5–15.5)
WBC: 9.3 10*3/uL (ref 4.0–10.5)

## 2017-06-04 LAB — I-STAT ARTERIAL BLOOD GAS, ED
Acid-base deficit: 3 mmol/L — ABNORMAL HIGH (ref 0.0–2.0)
BICARBONATE: 21.8 mmol/L (ref 20.0–28.0)
O2 Saturation: 94 %
Patient temperature: 98.5
TCO2: 23 mmol/L (ref 22–32)
pCO2 arterial: 35.9 mmHg (ref 32.0–48.0)
pH, Arterial: 7.391 (ref 7.350–7.450)
pO2, Arterial: 72 mmHg — ABNORMAL LOW (ref 83.0–108.0)

## 2017-06-04 LAB — I-STAT CG4 LACTIC ACID, ED: Lactic Acid, Venous: 1.08 mmol/L (ref 0.5–1.9)

## 2017-06-04 LAB — MRSA PCR SCREENING: MRSA by PCR: NEGATIVE

## 2017-06-04 LAB — INFLUENZA PANEL BY PCR (TYPE A & B)
Influenza A By PCR: NEGATIVE
Influenza B By PCR: NEGATIVE

## 2017-06-04 MED ORDER — ACETAMINOPHEN 325 MG PO TABS
650.0000 mg | ORAL_TABLET | Freq: Once | ORAL | Status: AC
  Administered 2017-06-04: 650 mg via ORAL
  Filled 2017-06-04: qty 2

## 2017-06-04 MED ORDER — ALBUTEROL (5 MG/ML) CONTINUOUS INHALATION SOLN
10.0000 mg/h | INHALATION_SOLUTION | Freq: Once | RESPIRATORY_TRACT | Status: AC
  Administered 2017-06-04: 10 mg/h via RESPIRATORY_TRACT
  Filled 2017-06-04: qty 2

## 2017-06-04 MED ORDER — ALBUTEROL SULFATE (2.5 MG/3ML) 0.083% IN NEBU
2.5000 mg | INHALATION_SOLUTION | RESPIRATORY_TRACT | Status: DC | PRN
  Administered 2017-06-06 – 2017-06-08 (×3): 2.5 mg via RESPIRATORY_TRACT
  Filled 2017-06-04 (×4): qty 3

## 2017-06-04 MED ORDER — BENZONATATE 100 MG PO CAPS
200.0000 mg | ORAL_CAPSULE | Freq: Three times a day (TID) | ORAL | Status: DC | PRN
  Administered 2017-06-04 – 2017-06-08 (×11): 200 mg via ORAL
  Filled 2017-06-04 (×11): qty 2

## 2017-06-04 MED ORDER — LEVOFLOXACIN IN D5W 750 MG/150ML IV SOLN
750.0000 mg | INTRAVENOUS | Status: DC
  Administered 2017-06-05 – 2017-06-06 (×2): 750 mg via INTRAVENOUS
  Filled 2017-06-04 (×2): qty 150

## 2017-06-04 MED ORDER — METHYLPREDNISOLONE SODIUM SUCC 125 MG IJ SOLR
60.0000 mg | Freq: Three times a day (TID) | INTRAMUSCULAR | Status: DC
  Administered 2017-06-04 – 2017-06-06 (×6): 60 mg via INTRAVENOUS
  Filled 2017-06-04 (×6): qty 2

## 2017-06-04 MED ORDER — IPRATROPIUM-ALBUTEROL 0.5-2.5 (3) MG/3ML IN SOLN
3.0000 mL | Freq: Four times a day (QID) | RESPIRATORY_TRACT | Status: DC
  Administered 2017-06-04 – 2017-06-05 (×3): 3 mL via RESPIRATORY_TRACT
  Filled 2017-06-04 (×3): qty 3

## 2017-06-04 MED ORDER — ENSURE ENLIVE PO LIQD
237.0000 mL | Freq: Two times a day (BID) | ORAL | Status: DC
  Administered 2017-06-05: 237 mL via ORAL

## 2017-06-04 MED ORDER — SODIUM CHLORIDE 0.9 % IV BOLUS (SEPSIS)
500.0000 mL | Freq: Once | INTRAVENOUS | Status: AC
  Administered 2017-06-04: 500 mL via INTRAVENOUS

## 2017-06-04 MED ORDER — KETOROLAC TROMETHAMINE 30 MG/ML IJ SOLN
30.0000 mg | Freq: Four times a day (QID) | INTRAMUSCULAR | Status: DC | PRN
  Administered 2017-06-06 – 2017-06-08 (×7): 30 mg via INTRAVENOUS
  Filled 2017-06-04 (×7): qty 1

## 2017-06-04 MED ORDER — GABAPENTIN 600 MG PO TABS
600.0000 mg | ORAL_TABLET | Freq: Two times a day (BID) | ORAL | Status: DC
  Administered 2017-06-04 – 2017-06-08 (×8): 600 mg via ORAL
  Filled 2017-06-04 (×8): qty 1

## 2017-06-04 MED ORDER — IPRATROPIUM-ALBUTEROL 0.5-2.5 (3) MG/3ML IN SOLN: 3.0000 mL | Freq: Once | RESPIRATORY_TRACT | Status: DC

## 2017-06-04 MED ORDER — TIOTROPIUM BROMIDE MONOHYDRATE 18 MCG IN CAPS
18.0000 ug | ORAL_CAPSULE | Freq: Every day | RESPIRATORY_TRACT | Status: DC
  Administered 2017-06-05 – 2017-06-08 (×6): 18 ug via RESPIRATORY_TRACT
  Filled 2017-06-04: qty 5

## 2017-06-04 MED ORDER — SODIUM CHLORIDE 0.9 % IV BOLUS (SEPSIS)
1000.0000 mL | Freq: Once | INTRAVENOUS | Status: AC
  Administered 2017-06-04: 1000 mL via INTRAVENOUS

## 2017-06-04 MED ORDER — LEVOFLOXACIN IN D5W 750 MG/150ML IV SOLN
750.0000 mg | Freq: Once | INTRAVENOUS | Status: AC
  Administered 2017-06-04: 750 mg via INTRAVENOUS
  Filled 2017-06-04: qty 150

## 2017-06-04 MED ORDER — NICOTINE 21 MG/24HR TD PT24
21.0000 mg | MEDICATED_PATCH | Freq: Every day | TRANSDERMAL | Status: DC
  Administered 2017-06-04 – 2017-06-08 (×5): 21 mg via TRANSDERMAL
  Filled 2017-06-04 (×5): qty 1

## 2017-06-04 MED ORDER — VANCOMYCIN HCL 500 MG IV SOLR
500.0000 mg | Freq: Two times a day (BID) | INTRAVENOUS | Status: DC
  Administered 2017-06-04 – 2017-06-07 (×5): 500 mg via INTRAVENOUS
  Filled 2017-06-04 (×6): qty 500

## 2017-06-04 MED ORDER — CLONAZEPAM 0.5 MG PO TABS: 0.5000 mg | ORAL_TABLET | Freq: Three times a day (TID) | ORAL | Status: DC

## 2017-06-04 MED ORDER — ENOXAPARIN SODIUM 40 MG/0.4ML ~~LOC~~ SOLN
40.0000 mg | SUBCUTANEOUS | Status: DC
  Administered 2017-06-04: 40 mg via SUBCUTANEOUS
  Filled 2017-06-04: qty 0.4

## 2017-06-04 MED ORDER — MOMETASONE FURO-FORMOTEROL FUM 200-5 MCG/ACT IN AERO
2.0000 | INHALATION_SPRAY | Freq: Two times a day (BID) | RESPIRATORY_TRACT | Status: DC
  Administered 2017-06-04 – 2017-06-08 (×8): 2 via RESPIRATORY_TRACT
  Filled 2017-06-04: qty 8.8

## 2017-06-04 MED ORDER — OXYCODONE HCL 5 MG PO TABS
5.0000 mg | ORAL_TABLET | Freq: Four times a day (QID) | ORAL | Status: DC | PRN
  Administered 2017-06-04 – 2017-06-08 (×13): 5 mg via ORAL
  Filled 2017-06-04 (×13): qty 1

## 2017-06-04 MED ORDER — SODIUM CHLORIDE 0.9 % IV SOLN
INTRAVENOUS | Status: DC
  Administered 2017-06-04 – 2017-06-06 (×5): via INTRAVENOUS

## 2017-06-04 MED ORDER — VANCOMYCIN HCL IN DEXTROSE 1-5 GM/200ML-% IV SOLN
1000.0000 mg | Freq: Once | INTRAVENOUS | Status: AC
  Administered 2017-06-04: 1000 mg via INTRAVENOUS
  Filled 2017-06-04: qty 200

## 2017-06-04 MED ORDER — HYDROXYZINE HCL 25 MG PO TABS
25.0000 mg | ORAL_TABLET | Freq: Four times a day (QID) | ORAL | Status: DC | PRN
  Administered 2017-06-06 – 2017-06-08 (×6): 25 mg via ORAL
  Filled 2017-06-04 (×6): qty 1

## 2017-06-04 MED ORDER — ENOXAPARIN SODIUM 30 MG/0.3ML ~~LOC~~ SOLN
22.0000 mg | SUBCUTANEOUS | Status: DC
  Filled 2017-06-04: qty 0.22

## 2017-06-04 MED ORDER — CLONAZEPAM 0.5 MG PO TABS
0.5000 mg | ORAL_TABLET | Freq: Three times a day (TID) | ORAL | Status: DC | PRN
  Administered 2017-06-04 – 2017-06-08 (×11): 0.5 mg via ORAL
  Filled 2017-06-04 (×11): qty 1

## 2017-06-04 NOTE — H&P (Addendum)
History and Physical    [name]. ZMO:[id number] DOB: 10/08/1980 DOA: 06/04/2017  PCP: Arnoldo Morale, MD Patient coming from: Home  Chief Complaint: sob  HPI: [name]. is a 36 y.o. male with medical history significant of Anxiety, asthma, bipolar, COPD, Crohns disease, Nephrolithiasis, drug abuse.  Level V caveat applies as patient is unable to provide any history this time due to somnolence state due to current infectious process. Limited history provided by report given to EDP from patient, patient's mother.  Patient with several day history of progressive shortness of breath. Associated with intermittent productive cough and subjective fevers. Patient also complaining of diffuse chest discomfort with coughing and occasional body aches. Patient continues to use marijuana and cocaine from time to time. Smokes approximately a half pack of cigarettes per day.  Unable to obtain further review of systems or history.  ED Course: Sepsis protocol initiated with IV fluids and broad-spectrum antibiotics initiated.  Review of Systems: As per HPI otherwise all other systems reviewed and are negative  Ambulatory Status:no restrictions  Past Medical History:  Diagnosis Date  . Alpha-1-antitrypsin deficiency ([place])   . Anxiety   . Asthma    mild   . Bipolar 1 disorder (HCC)    Self laceration and self harm behaviors   . COPD (chronic obstructive pulmonary disease) ([place])   . Crohn disease ([place]) Dx 2004   terminal ileum Crohn's s/p ileocolectomy with anastamosis  . Depression   . Kidney stones   . Substance abuse [facility])     Past Surgical History:  Procedure Laterality Date  . COLON SURGERY  01/17/2006  . INGUINAL HERNIA REPAIR     On the right and the left   . TONSILLECTOMY     20 yrs ago    Social History   Socioeconomic History  . Marital status: Single    Spouse name: Not on file  . Number of children: Not on file  . Years of education: Not on  file  . Highest education level: Not on file  Social Needs  . Financial resource strain: Not on file  . Food insecurity - worry: Not on file  . Food insecurity - inability: Not on file  . Transportation needs - medical: Not on file  . Transportation needs - non-medical: Not on file  Occupational History  . Not on file  Tobacco Use  . Smoking status: Current Some Day Smoker    Packs/day: 0.50    Years: 25.00    Pack years: 12.50    Types: Cigarettes    Last attempt to quit: 01/27/2017    Years since quitting: 0.3  . Smokeless tobacco: Never Used  Substance and Sexual Activity  . Alcohol use: Yes    Comment: occsionally  . Drug use: Yes    Frequency: 2.0 times per week    Types: Marijuana, Heroin    Comment: occasionally.   . Sexual activity: Yes    Birth control/protection: None  Other Topics Concern  . Not on file  Social History Narrative   Lives at home with mother     Allergies  Allergen Reactions  . Penicillins Anaphylaxis and Swelling    Has patient had a PCN reaction causing immediate rash, facial/tongue/throat swelling, SOB or lightheadedness with hypotension: Yes Has patient had a PCN reaction causing severe rash involving mucus membranes or skin necrosis: No Has patient had a PCN reaction that required hospitalization No Has patient had a PCN reaction occurring within  the last 10 years: No If all of the above answers are "NO", then may proceed with Cephalosporin use.   . Diphenhydramine Hcl Hives and Rash    Family History  Problem Relation Age of Onset  . Hypertension Other   . Diabetes Other   . COPD Maternal Grandfather       Prior to Admission medications   Medication Sig Start Date End Date Taking? Authorizing Provider  albuterol (PROVENTIL) (2.5 MG/3ML) 0.083% nebulizer solution Take 3 mLs (2.5 mg total) by nebulization every 6 (six) hours as needed for wheezing or shortness of breath. 12/29/16  Yes Fransico Meadow, PA-C    Aspirin-Salicylamide-Caffeine (BC HEADACHE POWDER PO) Take 1 Package daily by mouth.   Yes [provider]  budesonide-formoterol (SYMBICORT) 160-4.5 MCG/ACT inhaler Inhale 2 puffs into the lungs 2 (two) times daily. 02/23/17  Yes Tanda Rockers, MD  clonazePAM (KLONOPIN) 0.5 MG tablet Take 0.5 mg 3 (three) times daily by mouth.    Yes [provider]  diltiazem (TIAZAC) 300 MG 24 hr capsule Take 300 mg daily by mouth.   Yes [provider]  gabapentin (NEURONTIN) 600 MG tablet Take 600 mg 2 (two) times daily by mouth.   Yes [provider]  hydrOXYzine (ATARAX/VISTARIL) 25 MG tablet Take 1 tablet (25 mg total) by mouth every 6 (six) hours as needed for anxiety. 03/02/17  Yes Cardama, Grayce Sessions, MD  nicotine (NICODERM CQ - DOSED IN MG/24 HOURS) 21 mg/24hr patch Place 21 mg daily onto the skin.   Yes [provider]  ondansetron (ZOFRAN ODT) 4 MG disintegrating tablet Take 1 tablet (4 mg total) by mouth every 8 (eight) hours as needed for nausea or vomiting. 02/27/17  Yes Arnoldo Morale, MD  PROAIR HFA 108 (90 Base) MCG/ACT inhaler INHALE 2 PUFFS BY MOUTH EVERY 6 HOURS AS NEEDED FOR WHEEZING AND SHORTNESS OF BREATH 06/04/17  Yes Tanda Rockers, MD  promethazine (PHENERGAN) 25 MG tablet Take 25 mg daily as needed by mouth. 04/24/17  Yes [provider]  Tiotropium Bromide Monohydrate (SPIRIVA RESPIMAT) 2.5 MCG/ACT AERS Inhale 2 puffs into the lungs daily. 02/23/17  Yes Tanda Rockers, MD  gabapentin (NEURONTIN) 300 MG capsule Take 2 capsules (600 mg total) by mouth 2 (two) times daily. Patient not taking: Reported on 06/04/2017 02/27/17   Arnoldo Morale, MD    Physical Exam: Vitals:   06/04/17 1245 06/04/17 1249 06/04/17 1300 06/04/17 1315  BP: 99/64  95/62 92/61  Pulse: 93  93 93  Resp: (!) 22  (!) 25 (!) 21  Temp:  98.5 F (36.9 C)    TempSrc:  Oral    SpO2: 96%  95% 95%  Weight:         General: Very ill appearing, resting in  bed. Eyes: normal lids, iris ENT: dry mm, poor, unable to assess hearing due to somnolent state Neck:  no LAD, masses or thyromegaly Cardiovascular:  RRR, no m/r/g. No LE edema.  Respiratory: Diffuse wheezing, increased effort, diminished breath sounds in the bases with crackles. Abdomen:  soft, ntnd, NABS Skin:  no rash or induration seen on limited exam Musculoskeletal:  grossly normal tone BUE/BLE, good ROM, no bony abnormality Psychiatric: Somnolent. Difficult to arouse even with painful stimuli and opening of eyelids. Neurologic: Unable to fully assess due to somnolent state. Sensation grossly intact.  Labs on Admission: I have personally reviewed following labs and imaging studies  CBC: Recent Labs  Lab 06/04/17 1115  WBC  9.3  NEUTROABS 7.1  HGB 14.6  HCT 41.3  MCV 94.7  PLT 378   Basic Metabolic Panel: Recent Labs  Lab 06/04/17 1115  NA 132*  K 3.5  CL 102  CO2 19*  GLUCOSE 130*  BUN 7  CREATININE 0.89  CALCIUM 8.4*   GFR: Estimated Creatinine Clearance: 72.2 mL/min (by C-G formula based on SCr of 0.89 mg/dL). Liver Function Tests: Recent Labs  Lab 06/04/17 1115  AST 35  ALT 35  ALKPHOS 78  BILITOT 1.6*  PROT 6.1*  ALBUMIN 3.1*   No results for input(s): LIPASE, AMYLASE in the last 168 hours. No results for input(s): AMMONIA in the last 168 hours. Coagulation Profile: No results for input(s): INR, PROTIME in the last 168 hours. Cardiac Enzymes: No results for input(s): CKTOTAL, CKMB, CKMBINDEX, TROPONINI in the last 168 hours. BNP (last 3 results) No results for input(s): PROBNP in the last 8760 hours. HbA1C: No results for input(s): HGBA1C in the last 72 hours. CBG: No results for input(s): GLUCAP in the last 168 hours. Lipid Profile: No results for input(s): CHOL, HDL, LDLCALC, TRIG, CHOLHDL, LDLDIRECT in the last 72 hours. Thyroid Function Tests: No results for input(s): TSH, T4TOTAL, FREET4, T3FREE, THYROIDAB in the last 72 hours. Anemia  Panel: No results for input(s): VITAMINB12, FOLATE, FERRITIN, TIBC, IRON, RETICCTPCT in the last 72 hours. Urine analysis:    Component Value Date/Time   COLORURINE YELLOW 03/01/2017 2243   APPEARANCEUR CLEAR 03/01/2017 2243   LABSPEC 1.011 03/01/2017 2243   PHURINE 5.0 03/01/2017 2243   GLUCOSEU NEGATIVE 03/01/2017 2243   HGBUR NEGATIVE 03/01/2017 2243   BILIRUBINUR NEGATIVE 03/01/2017 2243   KETONESUR NEGATIVE 03/01/2017 2243   PROTEINUR NEGATIVE 03/01/2017 2243   UROBILINOGEN 1.0 10/05/2014 1501   NITRITE NEGATIVE 03/01/2017 2243   LEUKOCYTESUR NEGATIVE 03/01/2017 2243    Creatinine Clearance: Estimated Creatinine Clearance: 72.2 mL/min (by C-G formula based on SCr of 0.89 mg/dL).  Sepsis Labs: @LABRCNTIP (procalcitonin:4,lacticidven:4) )No results found for this or any previous visit (from the past 240 hour(s)).   Radiological Exams on Admission: Dg Chest Portable 1 View  Result Date: 06/04/2017 CLINICAL DATA:  Respiratory distress with cough. EXAM: PORTABLE CHEST 1 VIEW COMPARISON:  03/01/2017 FINDINGS: Chronic hyperinflation. Emphysema by radiography and CT. There is asymmetric density at the right base with costophrenic sulcus blunting. Normal heart size and mediastinal contours. EKG leads create artifact across the chest. IMPRESSION: 1. Increased markings at the right base which could reflect pneumonia or atelectasis. Trace right pleural effusion. 2. Emphysema. Electronically Signed   By: Monte Fantasia M.D.   On: 06/04/2017 11:35    EKG: Independently reviewed. Sinus. No ACS  Assessment/Plan Active Problems:   Sepsis ([place])   Substance abuse ([place])   Alpha-1-antitrypsin deficiency ([place])   Neuropathy   Lobar pneumonia (HCC)   Sepsis, unspecified organism ([place])   Respiratory failure with hypoxia ([place])   Essential hypertension   Sepsis: likely from RLL pneumonia. CXR as above. IVDA history denied by mother at time of admission so no need for further imaging such as  CT to eval for septic emboli or TEE for valve involvement for now.  - f/u BCX, UA - Continue Vanc and levaquin - sepsis and pneumonia protocol - Aggressive IVF  Acute Hypoxic respiratory failure: ABG on 6L [place] showing pH 7.39, PCO2 35.9, PO2 72, bicarbonate 23. Likely from RLL pneumonia w/ concurrent asthma flare and likely undiagnosed underlying COPD (h/o alpha 1 antitrypsin per mother????). Marked tachypnea at 37  breaths per minute at time of initial presentation.  - O2 prn - Treatment as above - Solumedrol 60 Q8, Duonebs Q6/Q2PRN,  - Continue Dulera  HTN: BP soft due to sepsis - Hold diltiazem  Peripheral neuropathy: - continue neurontin  Anxiety: - continue Hydroxyzine, Klonopin  THC/cocaine use: unsure of last usage.  - UDS  Tobacco: - nicotine patch   DVT prophylaxis: Lovenox  Code Status: full  Family Communication: mother  Disposition Plan: pending improvement  Consults called: Respiratory  Admission status: inpt - SDU    Waldemar Dickens MD [facility]  If 7PM-7AM, please contact night-coverage [email or web address] Password [facility]  06/04/2017, 1:47 PM      Critical Care Time This patient is critically ill requiring frequent monitoring and support due to their life/organ threatening condition. This care involves a high complexity of medical decision making. Greater than 70 minutes spent in direct patient care and coordination.

## 2017-06-04 NOTE — ED Notes (Signed)
Given 2 pillows, 1 blanket. Mom given [place] sandwich, sprite.

## 2017-06-04 NOTE — ED Provider Notes (Signed)
[facility] EMERGENCY DEPARTMENT Provider Note   CSN: [id number] Arrival date & time: 06/04/17  1038     History   Chief Complaint Chief Complaint  Patient presents with  . Shortness of Breath    HPI [name] Belen Zwahlen. is a 36 y.o. male.  HPI  36 y.o. male with a hx of Anxiety, Asthma, COPD, Crohn's Disease, Bipolar Disorder, Substance Abuse, presents to the Emergency Department today due to shortness of breath. Notes symptoms x several days. Worsening cough that is productive. Subjective fevers. Notes chest pain as well that occurs with cough. Rates pain 8/10. Also noted diffuse body aches. Hx pneumonia. No sick contacts. Pt was brought in by EMS and given solumedrol and 82m albuterol.   Limited HPI due to tachypnea with increased WOB  Past Medical History:  Diagnosis Date  . Anxiety   . Asthma    mild   . Bipolar 1 disorder (HCC)    Self laceration and self harm behaviors   . COPD (chronic obstructive pulmonary disease) ([place]   . [place] D[place]   terminal ileum Crohn's s/p ileocolectomy with anastamosis  . Depression   . Kidney stones   . Substance abuse     Patient Active Problem List   Diagnosis Date Noted  . Neuropathy 02/27/2017  . COPD with AB component  08/08/2016  . Alpha-1-antitrypsin deficiency ([place] 08/03/2016  . Diarrhea   . Crohn's disease with complication ([facility] 023/[id number]/7322 . Depression 02/27/2016  . Substance abuse ([place] 02/27/2016  . Influenza with pneumonia 10/07/2014  . Protein-calorie malnutrition, severe ([place] 10/06/2014  . Sepsis ([place] 10/05/2014  . CAP (community acquired pneumonia) 10/05/2014  . Asthma 10/05/2014  . Generalized abdominal pain   . Hematemesis 09/04/2011  . ANEMIA 09/20/2009  . BIPOLAR DISORDER UNSPECIFIED 09/20/2009  . ADHD 09/20/2009  . 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 INTESTINAL OBSTRUCTION, HX OF 09/20/2009  . COCAINE ABUSE, HX OF 09/20/2009    Past Surgical History:   Procedure Laterality Date  . COLON SURGERY  01/17/2006  . INGUINAL HERNIA REPAIR     On the right and the left   . TONSILLECTOMY     20 yrs ago       Home Medications    Prior to Admission medications   Medication Sig Start Date End Date Taking? Authorizing Provider  albuterol (PROVENTIL) (2.5 MG/3ML) 0.083% nebulizer solution Take 3 mLs (2.5 mg total) by nebulization every 6 (six) hours as needed for wheezing or shortness of breath. 12/29/16   SFransico Meadow PA-C  budesonide-formoterol (SYMBICORT) 160-4.5 MCG/ACT inhaler Inhale 2 puffs into the lungs 2 (two) times daily. 02/23/17   WTanda Rockers MD  clonazePAM (KLONOPIN) 0.5 MG tablet Take 0.5 mg by mouth 2 (two) times daily.    [provider]  gabapentin (NEURONTIN) 300 MG capsule Take 2 capsules (600 mg total) by mouth 2 (two) times daily. 02/27/17   AArnoldo Morale MD  hydrOXYzine (ATARAX/VISTARIL) 25 MG tablet Take 1 tablet (25 mg total) by mouth every 6 (six) hours as needed for anxiety. 03/02/17   CFatima Blank MD  ondansetron (ZOFRAN ODT) 4 MG disintegrating tablet Take 1 tablet (4 mg total) by mouth every 8 (eight) hours as needed for nausea or vomiting. 02/27/17   AArnoldo Morale MD  predniSONE (DELTASONE) 10 MG tablet Take  4 each am x 2 days,   2 each am x 2 days,  1 each am x 2 days and  stop Patient not taking: Reported on 03/01/2017 02/23/17   Tanda Rockers, MD  PROAIR HFA 108 [phone] Base) MCG/ACT inhaler INHALE 2 PUFFS BY MOUTH EVERY 6 HOURS AS NEEDED FOR WHEEZING AND SHORTNESS OF BREATH 05/08/17   Tanda Rockers, MD  Tiotropium Bromide Monohydrate (SPIRIVA RESPIMAT) 2.5 MCG/ACT AERS Inhale 2 puffs into the lungs daily. 02/23/17   Tanda Rockers, MD    Family History Family History  Problem Relation Age of Onset  . Hypertension Other   . Diabetes Other   . COPD Maternal Grandfather     Social History Social History   Tobacco Use  . Smoking status: Current Some Day Smoker    Packs/day: 0.50    Years:  25.00    Pack years: 12.50    Types: Cigarettes    Last attempt to quit: 01/27/2017    Years since quitting: 0.3  . Smokeless tobacco: Never Used  Substance Use Topics  . Alcohol use: Yes    Comment: occsionally  . Drug use: Yes    Frequency: 2.0 times per week    Types: Marijuana, Heroin    Comment: occasionally.      Allergies   Penicillins and Diphenhydramine hcl   Review of Systems Review of Systems  Unable to perform ROS: Acuity of condition   Physical Exam Updated Vital Signs BP 103/66   Pulse (!) 101   Temp (!) 102 F (38.9 C) (Oral)   Resp (!) 25   Wt 44.9 kg (99 lb)   SpO2 99%   BMI 16.47 kg/m   Physical Exam  Constitutional: He is oriented to person, place, and time. He appears well-developed and well-nourished. He has a sickly appearance.  HENT:  Head: Normocephalic and atraumatic.  Right Ear: Tympanic membrane, external ear and ear canal normal.  Left Ear: Tympanic membrane, external ear and ear canal normal.  Nose: Nose normal.  Mouth/Throat: Uvula is midline, oropharynx is clear and moist and mucous membranes are normal. No trismus in the jaw. No oropharyngeal exudate, posterior oropharyngeal erythema or tonsillar abscesses.  Eyes: EOM are normal. Pupils are equal, round, and reactive to light.  Neck: Normal range of motion. Neck supple. No tracheal deviation present.  Cardiovascular: Regular rhythm, S1 normal, S2 normal, normal heart sounds, intact distal pulses and normal pulses. Tachycardia present.  Pulmonary/Chest: Accessory muscle usage present. Tachypnea noted. No respiratory distress. He has no decreased breath sounds. He has wheezes in the right upper field, the right lower field, the left upper field and the left lower field. He has rhonchi in the right lower field and the left lower field. He has no rales.  Abdominal: Normal appearance and bowel sounds are normal. There is no tenderness.  Musculoskeletal: Normal range of motion.  Neurological:  He is alert and oriented to person, place, and time.  Skin: Skin is warm and dry.  Psychiatric: He has a normal mood and affect. His speech is normal and behavior is normal. Thought content normal.  Nursing note and vitals reviewed.  ED Treatments / Results  Labs (all labs ordered are listed, but only abnormal results are displayed) Labs Reviewed  COMPREHENSIVE METABOLIC PANEL - Abnormal; Notable for the following components:      Result Value   Sodium 132 (*)    CO2 19 (*)    Glucose, Bld 130 (*)    Calcium 8.4 (*)    Total Protein 6.1 (*)    Albumin 3.1 (*)    Total Bilirubin 1.6 (*)  All other components within normal limits  CULTURE, BLOOD (ROUTINE X 2)  CULTURE, BLOOD (ROUTINE X 2)  CBC WITH DIFFERENTIAL/PLATELET  URINALYSIS, ROUTINE W REFLEX MICROSCOPIC  INFLUENZA PANEL BY PCR (TYPE A & B)  I-STAT CG4 LACTIC ACID, ED  I-STAT CG4 LACTIC ACID, ED    EKG  EKG Interpretation  Date/Time:  Monday June 04 2017 10:47:39 EST Ventricular Rate:  123 PR Interval:    QRS Duration: 77 QT Interval:  333 QTC Calculation: 477 R Axis:   73 Text Interpretation:  Sinus tachycardia RSR' in V1 or V2, right VCD or RVH Borderline prolonged QT interval No significant change since last tracing Confirmed by Deno Etienne [phone]) on 06/04/2017 10:52:23 AM Also confirmed by Deno Etienne [phone]), editor Philomena Doheny [phone])  on 06/04/2017 11:03:34 AM       Radiology Dg Chest Portable 1 View  Result Date: 06/04/2017 CLINICAL DATA:  Respiratory distress with cough. EXAM: PORTABLE CHEST 1 VIEW COMPARISON:  03/01/2017 FINDINGS: Chronic hyperinflation. Emphysema by radiography and CT. There is asymmetric density at the right base with costophrenic sulcus blunting. Normal heart size and mediastinal contours. EKG leads create artifact across the chest. IMPRESSION: 1. Increased markings at the right base which could reflect pneumonia or atelectasis. Trace right pleural effusion. 2. Emphysema.  Electronically Signed   By: Monte Fantasia M.D.   On: 06/04/2017 11:35    Procedures Procedures (including critical care time)  Medications Ordered in ED Medications  levofloxacin (LEVAQUIN) IVPB 750 mg (750 mg Intravenous New Bag/Given 06/04/17 1139)  levofloxacin (LEVAQUIN) IVPB 750 mg (not administered)  vancomycin (VANCOCIN) 500 mg in sodium chloride 0.9 % 100 mL IVPB (not administered)  acetaminophen (TYLENOL) tablet 650 mg (650 mg Oral Given 06/04/17 1124)  sodium chloride 0.9 % bolus 1,000 mL (0 mLs Intravenous Stopped 06/04/17 1200)    And  sodium chloride 0.9 % bolus 500 mL (0 mLs Intravenous Stopped 06/04/17 1146)  vancomycin (VANCOCIN) IVPB 1000 mg/200 mL premix (0 mg Intravenous Paused 06/04/17 1151)  albuterol (PROVENTIL,VENTOLIN) solution continuous neb (10 mg/hr Nebulization Given 06/04/17 1108)     Initial Impression / Assessment and Plan / ED Course  I have reviewed the triage vital signs and the nursing notes.  Pertinent labs & imaging results that were available during my care of the patient were reviewed by me and considered in my medical decision making (see chart for details).  Final Clinical Impressions(s) / ED Diagnoses  {I have reviewed and evaluated the relevant laboratory values. {I have reviewed and evaluated the relevant imaging studies. {I have interpreted the relevant EKG. {I have reviewed the relevant previous healthcare records. {I have reviewed EMS Documentation. {I obtained HPI from historian. {Patient discussed with supervising physician.  ED Course:  Assessment: Pt is a 36 y.o. male with a hx of Anxiety, Asthma, COPD, Crohn's Disease, Bipolar Disorder, Substance Abuse, presents to the Emergency Department today due to shortness of breath. Notes symptoms x several days. Worsening cough that is productive. Subjective fevers. Notes chest pain as well that occurs with cough. Rates pain 8/10. Also noted diffuse body aches. Hx pneumonia. No sick contacts.  Pt was brought in by EMS and given solumedrol and 33m albuterol. On exam, pt septic appearing. VS with tachycardia 124 (likely 2/2 albuterol nebs), BP normotensive. O2 sat low 90s RA. Temp 102F. Lungs diffuse wheeze. Abdomen nontender soft. iStat Lactate 1.08. WBC 9.3. Code Sepsis initiated due to vitals with suspect PNA. Made NPO. Given levaquin and Vanc abx to cover  respiratory source. NS given as per Sepsis protocol (1500ML). CXR with possible RLL PNA. Plan is to [place]. Repeat sepsis assessment completed.  Disposition/Plan:  Admit Pt acknowledges and agrees with plan  Supervising Physician Deno Etienne, DO  Final diagnoses:  Community acquired pneumonia of right lower lobe of lung [facility])  Hypoxia    ED Discharge Orders    None       Shary Decamp, PA-C 06/04/17 [place], [place], DO 06/04/17 1303

## 2017-06-04 NOTE — ED Notes (Signed)
BP 86/62, Dr. Marily Memos made aware, reports give 1 L NS.

## 2017-06-04 NOTE — ED Triage Notes (Signed)
Pt arrives via EMS from home with SOB, cough with yellow sputum x3 days. This AM SOB worsened. Received 37m albuterol, 0.584matrovent, 12566molumedrol with little improvement. Pt with labored breathing, unable to speak in full sentences. C/o chest and leg pain. Current smoker.

## 2017-06-04 NOTE — Progress Notes (Signed)
Pt.'s mother kept asking why heart pill cardizem not prescribed for the pt. Explained that the said med would cause blood pressure to go down. Bp on the 80's-90's been given fluid resuscitation. Continue to monitor.

## 2017-06-04 NOTE — Progress Notes (Signed)
Pharmacy Antibiotic Note  [name]. is a 36 y.o. male admitted on 06/04/2017 with pneumonia and sepsis.  Pharmacy has been consulted for vancomycin/Levaquin dosing. Noted anaphylaxis/swelling allergy documented with PCN - no hx of receiving beta lactams here in the past. Tmax/24h 102, WBC 9.3, LA 1.08. SCr 0.89 on admit, CrCl~72.  Plan: Vancomycin 1g IV x 1; then 514m IV q12h Levaquin 7579mIV q24h Monitor clinical progress, c/s, renal function F/u de-escalation plan/LOT, vancomycin trough as indicated   Weight: 99 lb (44.9 kg)  Temp (24hrs), Avg:102 F (38.9 C), Min:102 F (38.9 C), Max:102 F (38.9 C)  No results for input(s): WBC, CREATININE, LATICACIDVEN, VANCOTROUGH, VANCOPEAK, VANCORANDOM, GENTTROUGH, GENTPEAK, GENTRANDOM, TOBRATROUGH, TOBRAPEAK, TOBRARND, AMIKACINPEAK, AMIKACINTROU, AMIKACIN in the last 168 hours.  CrCl cannot be calculated (Patient's most recent lab result is older than the maximum 21 days allowed.).    Allergies  Allergen Reactions  . Penicillins Anaphylaxis and Swelling    Has patient had a PCN reaction causing immediate rash, facial/tongue/throat swelling, SOB or lightheadedness with hypotension: Yes Has patient had a PCN reaction causing severe rash involving mucus membranes or skin necrosis: No Has patient had a PCN reaction that required hospitalization No Has patient had a PCN reaction occurring within the last 10 years: No If all of the above answers are "NO", then may proceed with Cephalosporin use.   . Diphenhydramine Hcl Hives and Rash   HaElicia LampPharmD, BCPS Clinical Pharmacist 06/04/2017 11:06 AM

## 2017-06-05 DIAGNOSIS — F1910 Other psychoactive substance abuse, uncomplicated: Secondary | ICD-10-CM

## 2017-06-05 DIAGNOSIS — J9601 Acute respiratory failure with hypoxia: Secondary | ICD-10-CM

## 2017-06-05 DIAGNOSIS — I10 Essential (primary) hypertension: Secondary | ICD-10-CM

## 2017-06-05 DIAGNOSIS — A419 Sepsis, unspecified organism: Principal | ICD-10-CM

## 2017-06-05 DIAGNOSIS — G629 Polyneuropathy, unspecified: Secondary | ICD-10-CM

## 2017-06-05 LAB — COMPREHENSIVE METABOLIC PANEL
ALT: 28 U/L (ref 17–63)
ANION GAP: 7 (ref 5–15)
AST: 25 U/L (ref 15–41)
Albumin: 2.7 g/dL — ABNORMAL LOW (ref 3.5–5.0)
Alkaline Phosphatase: 59 U/L (ref 38–126)
BUN: 11 mg/dL (ref 6–20)
CALCIUM: 8 mg/dL — AB (ref 8.9–10.3)
CHLORIDE: 109 mmol/L (ref 101–111)
CO2: 20 mmol/L — AB (ref 22–32)
CREATININE: 0.9 mg/dL (ref 0.61–1.24)
GFR calc non Af Amer: >60 mL/min (ref >60)
Glucose, Bld: 134 mg/dL — ABNORMAL HIGH (ref 65–99)
Potassium: 3.3 mmol/L — ABNORMAL LOW (ref 3.5–5.1)
SODIUM: 136 mmol/L (ref 135–145)
Total Bilirubin: 0.7 mg/dL (ref 0.3–1.2)
Total Protein: 5.1 g/dL — ABNORMAL LOW (ref 6.5–8.1)

## 2017-06-05 LAB — URINALYSIS, ROUTINE W REFLEX MICROSCOPIC
BILIRUBIN URINE: NEGATIVE
Glucose, UA: 50 mg/dL — AB
Hgb urine dipstick: NEGATIVE
KETONES UR: NEGATIVE mg/dL
LEUKOCYTES UA: NEGATIVE
NITRITE: NEGATIVE
Protein, ur: NEGATIVE mg/dL
Specific Gravity, Urine: 1.011 (ref 1.005–1.030)
pH: 6 (ref 5.0–8.0)

## 2017-06-05 LAB — CBC
HCT: 34.3 % — ABNORMAL LOW (ref 39.0–52.0)
Hemoglobin: 11.8 g/dL — ABNORMAL LOW (ref 13.0–17.0)
MCH: 32.7 pg (ref 26.0–34.0)
MCHC: 34.4 g/dL (ref 30.0–36.0)
MCV: 95 fL (ref 78.0–100.0)
PLATELETS: 163 10*3/uL (ref 150–400)
RBC: 3.61 MIL/uL — ABNORMAL LOW (ref 4.22–5.81)
RDW: 12.8 % (ref 11.5–15.5)
WBC: 4.9 10*3/uL (ref 4.0–10.5)

## 2017-06-05 LAB — RAPID URINE DRUG SCREEN, HOSP PERFORMED
AMPHETAMINES: NOT DETECTED
Barbiturates: NOT DETECTED
Benzodiazepines: POSITIVE — AB
Cocaine: POSITIVE — AB
Opiates: POSITIVE — AB
TETRAHYDROCANNABINOL: NOT DETECTED

## 2017-06-05 LAB — STREP PNEUMONIAE URINARY ANTIGEN: Strep Pneumo Urinary Antigen: NEGATIVE

## 2017-06-05 MED ORDER — IPRATROPIUM-ALBUTEROL 0.5-2.5 (3) MG/3ML IN SOLN
3.0000 mL | Freq: Three times a day (TID) | RESPIRATORY_TRACT | Status: DC
  Administered 2017-06-05 – 2017-06-06 (×4): 3 mL via RESPIRATORY_TRACT
  Filled 2017-06-05 (×4): qty 3

## 2017-06-05 MED ORDER — ENOXAPARIN SODIUM 30 MG/0.3ML ~~LOC~~ SOLN
30.0000 mg | SUBCUTANEOUS | Status: DC
  Administered 2017-06-05 – 2017-06-07 (×3): 30 mg via SUBCUTANEOUS
  Filled 2017-06-05 (×3): qty 0.3

## 2017-06-05 MED ORDER — ENSURE ENLIVE PO LIQD
237.0000 mL | Freq: Three times a day (TID) | ORAL | Status: DC
  Administered 2017-06-05 – 2017-06-08 (×8): 237 mL via ORAL

## 2017-06-05 NOTE — Progress Notes (Signed)
Initial Nutrition Assessment  DOCUMENTATION CODES:   Severe malnutrition in context of chronic illness, Underweight  INTERVENTION:   -Increase Ensure Enlive po TID, each supplement provides 350 kcal and 20 grams of protein  NUTRITION DIAGNOSIS:   Severe Malnutrition related to chronic illness(COPD) as evidenced by severe muscle depletion, severe fat depletion.  GOAL:   Patient will meet greater than or equal to 90% of their needs  MONITOR:   PO intake, Supplement acceptance, Labs, Weight trends, Skin, I & O's  REASON FOR ASSESSMENT:   Malnutrition Screening Tool    ASSESSMENT:   [name]. is a 36 y.o. male with medical history significant of Anxiety, asthma, bipolar, COPD, Crohns disease, Nephrolithiasis, drug abuse. Pt admitted with sepsis, likely from RLL PNA.  Spoke with pt and mother at bedside. Both report pt generally has a good appetite at home, but has always struggled with gaining weight. Per pt "I eat all the time, but can't gain any weight". Pt reports UBW is between 90-100#.   Pt reports he consumes 2-3 meals per day at minimum. Typical meals include meat, starch, and vegetables. Pt avoids hard, crunchy foods such as raw fruits, due to lack of teeth, but has been tolerating food at hospital without difficulty. Pt consumed about 50% of breakfast tray (100% of oatmeal, 50% of sausage, 75% of eggs and pancakes). Pt reports he would have eaten all of the tray if he was not interrupted due to multiple procedures today. He also enjoys Ensure supplements, however, does not consume consistently at home related to expense.   Discussed with pt importance of consuming additional calories and protein to assist with weight gain and preserve lean body mass. Per pt's mother, pt was recently diagnosed with a lung disorder which depletes muscle mass (alpha-1 antitrypsin deficiency, per her report). However, pt with ongoing muscle wasting related to chronic malnutrition-  reviewed RD note from > 1 year ago, which reveal similar exam results.   Medications reviewed and include solu-medrol.   UDS positive for barbiturates, opiates, and cocaine.   Labs reviewed: K 3.3.   NUTRITION - FOCUSED PHYSICAL EXAM:    Most Recent Value  Orbital Region  Moderate depletion  Upper Arm Region  Severe depletion  Thoracic and Lumbar Region  Severe depletion  Buccal Region  Moderate depletion  Temple Region  Moderate depletion  Clavicle Bone Region  Severe depletion  Clavicle and Acromion Bone Region  Severe depletion  Scapular Bone Region  Severe depletion  Dorsal Hand  Severe depletion  Patellar Region  Severe depletion  Anterior Thigh Region  Severe depletion  Posterior Calf Region  Severe depletion  Edema (RD Assessment)  None  Hair  Reviewed  Eyes  Reviewed  Mouth  Reviewed  Skin  Reviewed  Nails  Reviewed       Diet Order:  Diet regular Room service appropriate? Yes; Fluid consistency: Thin  EDUCATION NEEDS:   Education needs have been addressed  Skin:  Skin Assessment: Reviewed RN Assessment  Last BM:  06/04/17  Height:   Ht Readings from Last 1 Encounters:  06/04/17 5' 5"  (1.651 m)    Weight:   Wt Readings from Last 1 Encounters:  06/04/17 98 lb 15.8 oz (44.9 kg)    Ideal Body Weight:  61.8 kg  BMI:  Body mass index is 16.47 kg/m.  Estimated Nutritional Needs:   Kcal:  1600-1800  Protein:  90-105 grams  Fluid:  1.6-1.8 L    [name] A. Jimmye Norman,  RD, LDN, CDE Pager: [phone] After hours Pager: [phone]

## 2017-06-05 NOTE — Plan of Care (Signed)
Pt ambulating to the bathroom with 1 person assist.

## 2017-06-05 NOTE — Progress Notes (Signed)
Pt keeps pulling at leads and removing them. I asked the pt not to interfere with cardiac monitoring to ensure accurate readings. Will continue to monitor.

## 2017-06-05 NOTE — Progress Notes (Signed)
Went to the bathroom by himself without notifying staff. Advised to call for help when needed to avoid fall. Continue to monitor.

## 2017-06-05 NOTE — Progress Notes (Signed)
Pt was in the room going through a red bag. I inquired about what he was looking for and the pt stated "I was looking to see if my mom packed me some pens". Pt quickly closed the bag and left it by the sink. Pt then returned to bed and turned the light off. Will continue to monitor.

## 2017-06-05 NOTE — Progress Notes (Signed)
PROGRESS NOTE  [name]. [id number]:[id number] DOB: 05/25/1981 DOA: 06/04/2017 PCP: Arnoldo Morale, MD  HPI/Recap of past 65 hours: 36 year old male with history significant of asthma, COPD, Crohn's disease, anxiety, bipolar, polysubstance abuse, was admitted for sepsis and acute hypoxic respiratory failure likely from right lower lobe pneumonia, after patient presented with shortness of breath, productive cough and subjective fever.  Patient also uses marijuana and cocaine and smokes approximately half a pack of cigarettes per day.  Today, patient more awake, ambulating was able to provide history.  Denies any chest pain, worsening shortness of breath, abdominal pain, nausea/vomiting, diarrhea.  Patient noted to be febrile yesterday, no leukocytosis.  Assessment/Plan: Active Problems:   Sepsis ([facility])   Substance abuse ([place])   Alpha-1-antitrypsin deficiency ([place])   Neuropathy   Lobar pneumonia (HCC)   Sepsis, unspecified organism ([place])   Respiratory failure with hypoxia ([place])   Essential hypertension   #Sepsis Improving Likely from right lower lobe pneumonia Chest x-ray showed right lower lobe infiltrates Blood culture, no growth so far UA negative, urine strep pneumonia negative Lactic acid within normal limits Continue IV Vanco and Levaquin Continue IV fluids  #Acute hypoxic respiratory failure Improving Likely from right lower lobe pneumonia ??  COPD flare, history of alpha 1 antitrypsin deficiency Oxygen as needed Continue Solu-Medrol, duo nebs, Dulera  #Hypertension BP soft due to sepsis Continue to hold diltiazem  #Peripheral neuropathy Continue Neurontin  #Anxiety Continue hydroxyzine, Klonopin  #Polysubstance/tobacco abuse Urine drug screen positive for opiates, cocaine.  Also positive for benzo but patient is taking Klonopin Advised against drug use Nicotine patch  Code Status: Full  Family Communication: None at bedside  Disposition Plan:  Once stable   Consultants:  None  Procedures:  None  Antimicrobials:  IV Vanco  IV Levaquin  DVT prophylaxis: Lovenox   Objective: Vitals:   06/05/17 0759 06/05/17 0800 06/05/17 1200 06/05/17 1448  BP:   (!) 124/105   Pulse:   97   Resp:   18   Temp:   98.9 F (37.2 C)   TempSrc:   Oral   SpO2: 99% 99% 96% 97%  Weight:      Height:        Intake/Output Summary (Last 24 hours) at 06/05/2017 1608 Last data filed at 06/05/2017 1200 Gross per 24 hour  Intake 2847 ml  Output 1475 ml  Net 1372 ml   Filed Weights   06/04/17 1040 06/04/17 1630  Weight: 44.9 kg (99 lb) 44.9 kg (98 lb 15.8 oz)    Exam:   General: Alert, awake, oriented x3  Cardiovascular: Heart sounds S1-S2 present, no added heart sound  Respiratory: Diminished breath sounds bilaterally, diffuse wheezing, rhonchi noted  Abdomen: Soft, nontender, nondistended  Musculoskeletal: No bilateral pedal edema seen  Skin: Normal  Psychiatry: Anxious   Data Reviewed: CBC: Recent Labs  Lab 06/04/17 1115 06/05/17 0159  WBC 9.3 4.9  NEUTROABS 7.1  --   HGB 14.6 11.8*  HCT 41.3 34.3*  MCV 94.7 95.0  PLT 167 413   Basic Metabolic Panel: Recent Labs  Lab 06/04/17 1115 06/05/17 0159  NA 132* 136  K 3.5 3.3*  CL 102 109  CO2 19* 20*  GLUCOSE 130* 134*  BUN 7 11  CREATININE 0.89 0.90  CALCIUM 8.4* 8.0*   GFR: Estimated Creatinine Clearance: 71.4 mL/min (by C-G formula based on SCr of 0.9 mg/dL). Liver Function Tests: Recent Labs  Lab 06/04/17 1115 06/05/17 0159  AST 35 25  ALT 35 28  ALKPHOS 78 59  BILITOT 1.6* 0.7  PROT 6.1* 5.1*  ALBUMIN 3.1* 2.7*   No results for input(s): LIPASE, AMYLASE in the last 168 hours. No results for input(s): AMMONIA in the last 168 hours. Coagulation Profile: No results for input(s): INR, PROTIME in the last 168 hours. Cardiac Enzymes: No results for input(s): CKTOTAL, CKMB, CKMBINDEX, TROPONINI in the last 168 hours. BNP (last 3  results) No results for input(s): PROBNP in the last 8760 hours. HbA1C: No results for input(s): HGBA1C in the last 72 hours. CBG: No results for input(s): GLUCAP in the last 168 hours. Lipid Profile: No results for input(s): CHOL, HDL, LDLCALC, TRIG, CHOLHDL, LDLDIRECT in the last 72 hours. Thyroid Function Tests: No results for input(s): TSH, T4TOTAL, FREET4, T3FREE, THYROIDAB in the last 72 hours. Anemia Panel: No results for input(s): VITAMINB12, FOLATE, FERRITIN, TIBC, IRON, RETICCTPCT in the last 72 hours. Urine analysis:    Component Value Date/Time   COLORURINE YELLOW 06/05/2017 0235   APPEARANCEUR CLEAR 06/05/2017 0235   LABSPEC 1.011 06/05/2017 0235   PHURINE 6.0 06/05/2017 0235   GLUCOSEU 50 (A) 06/05/2017 0235   HGBUR NEGATIVE 06/05/2017 0235   BILIRUBINUR NEGATIVE 06/05/2017 0235   KETONESUR NEGATIVE 06/05/2017 0235   PROTEINUR NEGATIVE 06/05/2017 0235   UROBILINOGEN 1.0 10/05/2014 1501   NITRITE NEGATIVE 06/05/2017 0235   LEUKOCYTESUR NEGATIVE 06/05/2017 0235   Sepsis Labs: @LABRCNTIP (procalcitonin:4,lacticidven:4)  ) Recent Results (from the past 240 hour(s))  Blood Culture (routine x 2)     Status: None (Preliminary result)   Collection Time: 06/04/17 11:15 AM  Result Value Ref Range Status   Specimen Description BLOOD RIGHT ANTECUBITAL  Final   Special Requests   Final    BOTTLES DRAWN AEROBIC AND ANAEROBIC Blood Culture results may not be optimal due to an inadequate volume of blood received in culture bottles   Culture NO GROWTH 1 DAY  Final   Report Status PENDING  Incomplete  Blood Culture (routine x 2)     Status: None (Preliminary result)   Collection Time: 06/04/17 11:35 AM  Result Value Ref Range Status   Specimen Description BLOOD LEFT HAND  Final   Special Requests   Final    BOTTLES DRAWN AEROBIC AND ANAEROBIC Blood Culture adequate volume   Culture NO GROWTH 1 DAY  Final   Report Status PENDING  Incomplete  MRSA PCR Screening     Status:  None   Collection Time: 06/04/17  4:25 PM  Result Value Ref Range Status   MRSA by PCR NEGATIVE NEGATIVE Final    Comment:        The GeneXpert MRSA Assay (FDA approved for NASAL specimens only), is one component of a comprehensive MRSA colonization surveillance program. It is not intended to diagnose MRSA infection nor to guide or monitor treatment for MRSA infections.       Studies: No results found.  Scheduled Meds: . enoxaparin (LOVENOX) injection  30 mg Subcutaneous Q24H  . feeding supplement (ENSURE ENLIVE)  237 mL Oral TID BM  . gabapentin  600 mg Oral BID  . ipratropium-albuterol  3 mL Nebulization TID  . methylPREDNISolone (SOLU-MEDROL) injection  60 mg Intravenous Q8H  . mometasone-formoterol  2 puff Inhalation BID  . nicotine  21 mg Transdermal Daily  . tiotropium  18 mcg Inhalation Daily    Continuous Infusions: . sodium chloride 100 mL/hr at 06/05/17 0400  . levofloxacin (LEVAQUIN) IV Stopped (06/05/17 1216)  . vancomycin Stopped (06/05/17  1525)     LOS: 1 day     Alma Friendly, MD [facility]   If 7PM-7AM, please contact night-coverage [email or web address] Password [email or web address] 06/05/2017, 4:08 PM

## 2017-06-06 ENCOUNTER — Encounter (HOSPITAL_COMMUNITY): Payer: Self-pay

## 2017-06-06 LAB — CBC WITH DIFFERENTIAL/PLATELET
BASOS ABS: 0 10*3/uL (ref 0.0–0.1)
BASOS PCT: 0 %
EOS ABS: 0 10*3/uL (ref 0.0–0.7)
EOS PCT: 0 %
HCT: 31.7 % — ABNORMAL LOW (ref 39.0–52.0)
Hemoglobin: 10.8 g/dL — ABNORMAL LOW (ref 13.0–17.0)
Lymphocytes Relative: 10 %
Lymphs Abs: 0.6 10*3/uL — ABNORMAL LOW (ref 0.7–4.0)
MCH: 32.3 pg (ref 26.0–34.0)
MCHC: 34.1 g/dL (ref 30.0–36.0)
MCV: 94.9 fL (ref 78.0–100.0)
Monocytes Absolute: 0.4 10*3/uL (ref 0.1–1.0)
Monocytes Relative: 6 %
Neutro Abs: 4.9 10*3/uL (ref 1.7–7.7)
Neutrophils Relative %: 84 %
PLATELETS: 158 10*3/uL (ref 150–400)
RBC: 3.34 MIL/uL — AB (ref 4.22–5.81)
RDW: 13 % (ref 11.5–15.5)
WBC: 5.8 10*3/uL (ref 4.0–10.5)

## 2017-06-06 LAB — BASIC METABOLIC PANEL
ANION GAP: 3 — AB (ref 5–15)
BUN: 12 mg/dL (ref 6–20)
CALCIUM: 8.2 mg/dL — AB (ref 8.9–10.3)
CHLORIDE: 112 mmol/L — AB (ref 101–111)
CO2: 24 mmol/L (ref 22–32)
Creatinine, Ser: 0.73 mg/dL (ref 0.61–1.24)
GFR calc non Af Amer: >60 mL/min (ref >60)
Glucose, Bld: 180 mg/dL — ABNORMAL HIGH (ref 65–99)
POTASSIUM: 3.1 mmol/L — AB (ref 3.5–5.1)
SODIUM: 139 mmol/L (ref 135–145)

## 2017-06-06 LAB — LEGIONELLA PNEUMOPHILA SEROGP 1 UR AG: L. pneumophila Serogp 1 Ur Ag: NEGATIVE

## 2017-06-06 MED ORDER — METHYLPREDNISOLONE SODIUM SUCC 40 MG IJ SOLR
40.0000 mg | Freq: Two times a day (BID) | INTRAMUSCULAR | Status: DC
  Administered 2017-06-06 – 2017-06-07 (×2): 40 mg via INTRAVENOUS
  Filled 2017-06-06 (×2): qty 1

## 2017-06-06 MED ORDER — DEXTROSE 5 % IV SOLN
1.0000 g | Freq: Three times a day (TID) | INTRAVENOUS | Status: DC
  Filled 2017-06-06: qty 1

## 2017-06-06 MED ORDER — POTASSIUM CHLORIDE CRYS ER 20 MEQ PO TBCR
40.0000 meq | EXTENDED_RELEASE_TABLET | Freq: Two times a day (BID) | ORAL | Status: AC
  Administered 2017-06-06 (×2): 40 meq via ORAL
  Filled 2017-06-06 (×2): qty 2

## 2017-06-06 MED ORDER — LEVALBUTEROL HCL 1.25 MG/0.5ML IN NEBU
1.2500 mg | INHALATION_SOLUTION | Freq: Three times a day (TID) | RESPIRATORY_TRACT | Status: DC
  Administered 2017-06-06 (×2): 1.25 mg via RESPIRATORY_TRACT
  Filled 2017-06-06 (×2): qty 0.5

## 2017-06-06 MED ORDER — LEVALBUTEROL HCL 1.25 MG/0.5ML IN NEBU
1.2500 mg | INHALATION_SOLUTION | Freq: Three times a day (TID) | RESPIRATORY_TRACT | Status: DC
  Administered 2017-06-07 – 2017-06-08 (×5): 1.25 mg via RESPIRATORY_TRACT
  Filled 2017-06-06 (×5): qty 0.5

## 2017-06-06 MED ORDER — DILTIAZEM HCL ER COATED BEADS 120 MG PO CP24
120.0000 mg | ORAL_CAPSULE | Freq: Every day | ORAL | Status: DC
  Administered 2017-06-06 – 2017-06-08 (×3): 120 mg via ORAL
  Filled 2017-06-06 (×3): qty 1

## 2017-06-06 NOTE — Plan of Care (Signed)
Pt. Understands importance of antibiotic therapy and is washing hands frequently. Will continue to monitor.

## 2017-06-06 NOTE — Progress Notes (Signed)
ANTIBIOTIC CONSULT NOTE - INITIAL  Pharmacy Consult for Aztreonam Indication: pneumonia  Allergies  Allergen Reactions  . Penicillins Anaphylaxis and Swelling    Has patient had a PCN reaction causing immediate rash, facial/tongue/throat swelling, SOB or lightheadedness with hypotension: Yes Has patient had a PCN reaction causing severe rash involving mucus membranes or skin necrosis: No Has patient had a PCN reaction that required hospitalization No Has patient had a PCN reaction occurring within the last 10 years: No If all of the above answers are "NO", then may proceed with Cephalosporin use.   . Diphenhydramine Hcl Hives and Rash    Patient Measurements: Height: 5' 5"  (165.1 cm) Weight: 98 lb 15.8 oz (44.9 kg) IBW/kg (Calculated) : 61.5 Adjusted Body Weight:   Vital Signs: Temp: 98.3 F (36.8 C) (11/14 1211) Temp Source: Oral (11/14 1211) BP: 123/90 (11/14 1211) Pulse Rate: 108 (11/14 0800) Intake/Output from previous day: 11/13 0701 - 11/14 0700 In: 4982 [P.O.:1203; I.V.:2100; IV Piggyback:350] Out: 1300 [Urine:1300] Intake/Output from this shift: Total I/O In: 250 [IV Piggyback:250] Out: 450 [Urine:450]  Labs: Recent Labs    06/04/17 1115 06/05/17 0159 06/06/17 0229  WBC 9.3 4.9 5.8  HGB 14.6 11.8* 10.8*  PLT 167 163 158  CREATININE 0.89 0.90 0.73   Estimated Creatinine Clearance: 80.3 mL/min (by C-G formula based on SCr of 0.73 mg/dL). No results for input(s): VANCOTROUGH, VANCOPEAK, VANCORANDOM, GENTTROUGH, GENTPEAK, GENTRANDOM, TOBRATROUGH, TOBRAPEAK, TOBRARND, AMIKACINPEAK, AMIKACINTROU, AMIKACIN in the last 72 hours.   Microbiology:   Medical History: Past Medical History:  Diagnosis Date  . Alpha-1-antitrypsin deficiency ([place])   . Anxiety   . Asthma    mild   . Bipolar 1 disorder (H[facility])    Self laceration and self harm behaviors   . COPD (chronic obstructive pulmonary disease) ([place])   . Crohn disease ([place]) Dx 2004   terminal ileum Crohn's  s/p ileocolectomy with anastamosis  . Depression   . Kidney stones   . Substance abuse ([place])     Assessment:  ID: Abx D2 for PNA/sepsis. Afebrile. WBC 5.8 down. Scr WNL.  Vanco 11/12>> Levaquin11/12>>11/14 (with steroid more than double risk of tendon rupture) Aztreonam 11/14>>  11/12 BC x 2>>  Goal of Therapy:  Eradication of infection  Plan:  Vancomycin 590m IV q12h. Trough tomorrow Add Aztreonam 1g IV q 8 hr. Start tomorrow when next dose of Levaquin due. D/c Levaquin  REilene GhaziStillinger 06/06/2017,1:18 PM

## 2017-06-06 NOTE — Progress Notes (Signed)
PROGRESS NOTE  [name]. [id number] DOB: [birth year] DOA: 06/04/2017 PCP: Arnoldo Morale, MD  HPI/Recap of past 21 hours: 36 year old male with history significant of asthma, COPD, Crohn's disease, anxiety, bipolar, polysubstance abuse, was admitted for sepsis and acute hypoxic respiratory failure likely from right lower lobe pneumonia, after patient presented with shortness of breath, productive cough and subjective fever.  Patient also uses marijuana, cocaine and smokes approximately half a pack of cigarettes per day.  Today, patient more awake, ambulating was able to provide history.  Denies any chest pain, worsening shortness of breath, abdominal pain, nausea/vomiting, diarrhea, fever/chills. Patient noted to be agitated and stressed out about social issues, pt not ready to discuss it yet.  Assessment/Plan: Active Problems:   Sepsis ([place])   Substance abuse ([place])   Alpha-1-antitrypsin deficiency ([place])   Neuropathy   Lobar pneumonia (HCC)   Sepsis, unspecified organism ([place])   Respiratory failure with hypoxia ([place])   Essential hypertension   #Sepsis Improving Likely from right lower lobe pneumonia Chest x-ray showed right lower lobe infiltrates Blood culture, no growth so far UA negative, urine strep pneumonia negative Lactic acid within normal limits Continue IV Vanco and Levaquin Continue IV fluids as pt is still tachy  #Acute hypoxic respiratory failure Improving Likely from right lower lobe pneumonia ??  COPD flare, history of alpha 1 antitrypsin deficiency Oxygen as needed Continue Solu-Medrol, duo nebs, Dulera  #Hypertension BP soft due to sepsis Restarted diltiazem at a lower dose 118m. Pt takes 3050mdaily at home  #Peripheral neuropathy Continue Neurontin  #Anxiety Continue hydroxyzine, Klonopin  #Polysubstance/tobacco abuse Urine drug screen positive for opiates, cocaine.  Also positive for benzo but patient is taking Klonopin Advised  against drug use Nicotine patch  Code Status: Full  Family Communication: None at bedside  Disposition Plan: Once stable   Consultants:  None  Procedures:  None  Antimicrobials:  IV Vanco  IV Levaquin  DVT prophylaxis: Lovenox   Objective: Vitals:   06/06/17 0712 06/06/17 0750 06/06/17 0800 06/06/17 1211  BP:   91/61 123/90  Pulse:   (!) 108   Resp:   20   Temp: 98.1 F (36.7 C)   98.3 F (36.8 C)  TempSrc: Oral   Oral  SpO2:  100% 100%   Weight:      Height:        Intake/Output Summary (Last 24 hours) at 06/06/2017 1258 Last data filed at 06/06/2017 1200 Gross per 24 hour  Intake 2796 ml  Output 1750 ml  Net 1046 ml   Filed Weights   06/04/17 1040 06/04/17 1630  Weight: 44.9 kg (99 lb) 44.9 kg (98 lb 15.8 oz)    Exam:   General: Alert, awake, oriented x3  Cardiovascular: Heart sounds S1-S2 present, no added heart sound  Respiratory: Diminished breath sounds bilaterally, mild wheezing, rhonchi noted  Abdomen: Soft, nontender, nondistended  Musculoskeletal: No bilateral pedal edema seen  Skin: Normal  Psychiatry: Anxious   Data Reviewed: CBC: Recent Labs  Lab 06/04/17 1115 06/05/17 0159 06/06/17 0229  WBC 9.3 4.9 5.8  NEUTROABS 7.1  --  4.9  HGB 14.6 11.8* 10.8*  HCT 41.3 34.3* 31.7*  MCV 94.7 95.0 94.9  PLT 167 163 15578 Basic Metabolic Panel: Recent Labs  Lab 06/04/17 1115 06/05/17 0159 06/06/17 0229  NA 132* 136 139  K 3.5 3.3* 3.1*  CL 102 109 112*  CO2 19* 20* 24  GLUCOSE 130* 134* 180*  BUN 7 11  12  CREATININE 0.89 0.90 0.73  CALCIUM 8.4* 8.0* 8.2*   GFR: Estimated Creatinine Clearance: 80.3 mL/min (by C-G formula based on SCr of 0.73 mg/dL). Liver Function Tests: Recent Labs  Lab 06/04/17 1115 06/05/17 0159  AST 35 25  ALT 35 28  ALKPHOS 78 59  BILITOT 1.6* 0.7  PROT 6.1* 5.1*  ALBUMIN 3.1* 2.7*   No results for input(s): LIPASE, AMYLASE in the last 168 hours. No results for input(s): AMMONIA  in the last 168 hours. Coagulation Profile: No results for input(s): INR, PROTIME in the last 168 hours. Cardiac Enzymes: No results for input(s): CKTOTAL, CKMB, CKMBINDEX, TROPONINI in the last 168 hours. BNP (last 3 results) No results for input(s): PROBNP in the last 8760 hours. HbA1C: No results for input(s): HGBA1C in the last 72 hours. CBG: No results for input(s): GLUCAP in the last 168 hours. Lipid Profile: No results for input(s): CHOL, HDL, LDLCALC, TRIG, CHOLHDL, LDLDIRECT in the last 72 hours. Thyroid Function Tests: No results for input(s): TSH, T4TOTAL, FREET4, T3FREE, THYROIDAB in the last 72 hours. Anemia Panel: No results for input(s): VITAMINB12, FOLATE, FERRITIN, TIBC, IRON, RETICCTPCT in the last 72 hours. Urine analysis:    Component Value Date/Time   COLORURINE YELLOW 06/05/2017 0235   APPEARANCEUR CLEAR 06/05/2017 0235   LABSPEC 1.011 06/05/2017 0235   PHURINE 6.0 06/05/2017 0235   GLUCOSEU 50 (A) 06/05/2017 0235   HGBUR NEGATIVE 06/05/2017 0235   BILIRUBINUR NEGATIVE 06/05/2017 0235   KETONESUR NEGATIVE 06/05/2017 0235   PROTEINUR NEGATIVE 06/05/2017 0235   UROBILINOGEN 1.0 10/05/2014 1501   NITRITE NEGATIVE 06/05/2017 0235   LEUKOCYTESUR NEGATIVE 06/05/2017 0235   Sepsis Labs: @LABRCNTIP (procalcitonin:4,lacticidven:4)  ) Recent Results (from the past 240 hour(s))  Blood Culture (routine x 2)     Status: None (Preliminary result)   Collection Time: 06/04/17 11:15 AM  Result Value Ref Range Status   Specimen Description BLOOD RIGHT ANTECUBITAL  Final   Special Requests   Final    BOTTLES DRAWN AEROBIC AND ANAEROBIC Blood Culture results may not be optimal due to an inadequate volume of blood received in culture bottles   Culture NO GROWTH 2 DAYS  Final   Report Status PENDING  Incomplete  Blood Culture (routine x 2)     Status: None (Preliminary result)   Collection Time: 06/04/17 11:35 AM  Result Value Ref Range Status   Specimen Description  BLOOD LEFT HAND  Final   Special Requests   Final    BOTTLES DRAWN AEROBIC AND ANAEROBIC Blood Culture adequate volume   Culture NO GROWTH 2 DAYS  Final   Report Status PENDING  Incomplete  MRSA PCR Screening     Status: None   Collection Time: 06/04/17  4:25 PM  Result Value Ref Range Status   MRSA by PCR NEGATIVE NEGATIVE Final    Comment:        The GeneXpert MRSA Assay (FDA approved for NASAL specimens only), is one component of a comprehensive MRSA colonization surveillance program. It is not intended to diagnose MRSA infection nor to guide or monitor treatment for MRSA infections.       Studies: No results found.  Scheduled Meds: . diltiazem  120 mg Oral Daily  . enoxaparin (LOVENOX) injection  30 mg Subcutaneous Q24H  . feeding supplement (ENSURE ENLIVE)  237 mL Oral TID BM  . gabapentin  600 mg Oral BID  . levalbuterol  1.25 mg Nebulization Q8H  . methylPREDNISolone (SOLU-MEDROL) injection  40 mg  Intravenous Q12H  . mometasone-formoterol  2 puff Inhalation BID  . nicotine  21 mg Transdermal Daily  . potassium chloride  40 mEq Oral BID  . tiotropium  18 mcg Inhalation Daily    Continuous Infusions: . sodium chloride 100 mL/hr at 06/06/17 0400  . levofloxacin (LEVAQUIN) IV 750 mg (06/06/17 1156)  . vancomycin Stopped (06/06/17 1252)     LOS: 2 days     Alma Friendly, MD [facility]   If 7PM-7AM, please contact night-coverage [email or web address] Password [email or web address] 06/06/2017, 12:58 PM

## 2017-06-06 NOTE — Evaluation (Signed)
Physical Therapy Evaluation Patient Details Name: [name]. MRN: [id number] DOB: 10-24-1980 Today's Date: 06/06/2017   History of Present Illness  Pt is a 36 y.o. male with PMH significant for asthma, COPD, Crohn's disease, anxiety, bipolar, polysubstance abuse, was admitted on 06/04/17 for sepsis and acute hypoxic respiratory failure likely from right lower lobe pneumonia.    Clinical Impression  Pt presents with an overall decrease in functional mobility secondary to above. PTA, pt indep with mobility and lives with mother who is his caregiver and assists with ADLs as needed. Today, pt able to amb 200' with supervision for balance, requiring 1x seated rest break secondary to fatigue; SpO2 down to 87% on RA, returning to >94% with rest and deep breathing. Feel pt would benefit from gait training with SPC to improve stability at home; pt agrees. Pt would benefit from continued acute PT services to maximize functional mobility and independence prior to d/c home.     Follow Up Recommendations No PT follow up;Supervision - Intermittent    Equipment Recommendations  Cane    Recommendations for Other Services       Precautions / Restrictions Precautions Precautions: Fall Restrictions Weight Bearing Restrictions: No      Mobility  Bed Mobility Overal bed mobility: Independent                Transfers Overall transfer level: Needs assistance Equipment used: None Transfers: Sit to/from Stand Sit to Stand: Supervision         General transfer comment: Assist for lines/leads  Ambulation/Gait Ambulation/Gait assistance: Supervision Ambulation Distance (Feet): 200 Feet Assistive device: None Gait Pattern/deviations: Step-through pattern;Decreased stride length Gait velocity: Decreased Gait velocity interpretation: <1.8 ft/sec, indicative of risk for recurrent falls General Gait Details: Required 1x rest break secondary to increased SOB; SpO2 down to 87% on  RA, returning to >94% with seated rest and deep breathing technique  Stairs            Wheelchair Mobility    Modified Rankin (Stroke Patients Only)       Balance Overall balance assessment: Needs assistance   Sitting balance-Leahy Scale: Good       Standing balance-Leahy Scale: Fair                               Pertinent Vitals/Pain Pain Assessment: Faces Faces Pain Scale: Hurts little more Pain Location: L calf Pain Descriptors / Indicators: Sore Pain Intervention(s): Monitored during session    Home Living Family/patient expects to be discharged to:: Private residence Living Arrangements: Parent;Non-relatives/Friends Available Help at Discharge: Family;Available 24 hours/day(Mother) Type of Home: House Home Access: Stairs to enter   [employer] of Steps: 3 Home Layout: One level   Additional Comments: Pt reports mother is his primary caregiver and available 24/7; has multiple roommates in addition to mother    Prior Function Level of Independence: Independent               Hand Dominance        Extremity/Trunk Assessment   Upper Extremity Assessment Upper Extremity Assessment: Overall WFL for tasks assessed    Lower Extremity Assessment Lower Extremity Assessment: Generalized weakness       Communication   Communication: No difficulties  Cognition Arousal/Alertness: Awake/alert Behavior During Therapy: WFL for tasks assessed/performed Overall Cognitive Status: Within Functional Limits for tasks assessed  General Comments General comments (skin integrity, edema, etc.): Mother present at end of session    Exercises     Assessment/Plan    PT Assessment Patient needs continued PT services  PT Problem List Decreased strength;Decreased activity tolerance;Decreased balance;Decreased mobility;Decreased knowledge of use of DME;Cardiopulmonary status limiting  activity       PT Treatment Interventions Stair training;Functional mobility training;Gait training;Therapeutic activities;DME instruction;Therapeutic exercise;Balance training;Patient/family education    PT Goals (Current goals can be found in the Care Plan section)  Acute Rehab PT Goals Patient Stated Goal: Return home PT Goal Formulation: With patient Time For Goal Achievement: 06/20/17 Potential to Achieve Goals: Good    Frequency Min 3X/week   Barriers to discharge        Co-evaluation               AM-PAC PT "6 Clicks" Daily Activity  Outcome Measure Difficulty turning over in bed (including adjusting bedclothes, sheets and blankets)?: None Difficulty moving from lying on back to sitting on the side of the bed? : None Difficulty sitting down on and standing up from a chair with arms (e.g., wheelchair, bedside commode, etc,.)?: None Help needed moving to and from a bed to chair (including a wheelchair)?: A Little Help needed walking in hospital room?: A Little Help needed climbing 3-5 steps with a railing? : A Little 6 Click Score: 21    End of Session Equipment Utilized During Treatment: Gait belt Activity Tolerance: Patient tolerated treatment well;Patient limited by fatigue Patient left: in bed;with call bell/phone within reach;with family/visitor present Nurse Communication: Mobility status PT Visit Diagnosis: Other abnormalities of gait and mobility (R26.89)    Time: 3403-7096 PT Time Calculation (min) (ACUTE ONLY): 23 min   Charges:   PT Evaluation $PT Eval Moderate Complexity: 1 Mod PT Treatments $Gait Training: 8-22 mins   PT G Codes:       Mabeline Caras, PT, DPT Acute Rehab Services  Pager: [place] 06/06/2017, 2:32 PM

## 2017-06-07 ENCOUNTER — Encounter (HOSPITAL_COMMUNITY): Payer: Self-pay

## 2017-06-07 ENCOUNTER — Other Ambulatory Visit: Payer: Self-pay

## 2017-06-07 LAB — RESPIRATORY PANEL BY PCR
Adenovirus: NOT DETECTED
BORDETELLA PERTUSSIS-RVPCR: NOT DETECTED
CHLAMYDOPHILA PNEUMONIAE-RVPPCR: NOT DETECTED
Coronavirus 229E: NOT DETECTED
Coronavirus HKU1: NOT DETECTED
Coronavirus NL63: NOT DETECTED
Coronavirus OC43: NOT DETECTED
INFLUENZA A-RVPPCR: NOT DETECTED
Influenza B: NOT DETECTED
MYCOPLASMA PNEUMONIAE-RVPPCR: NOT DETECTED
Metapneumovirus: NOT DETECTED
PARAINFLUENZA VIRUS 3-RVPPCR: NOT DETECTED
PARAINFLUENZA VIRUS 4-RVPPCR: NOT DETECTED
Parainfluenza Virus 1: NOT DETECTED
Parainfluenza Virus 2: NOT DETECTED
RHINOVIRUS / ENTEROVIRUS - RVPPCR: NOT DETECTED
Respiratory Syncytial Virus: NOT DETECTED

## 2017-06-07 LAB — CBC WITH DIFFERENTIAL/PLATELET
Basophils Absolute: 0 10*3/uL (ref 0.0–0.1)
Basophils Relative: 0 %
Eosinophils Absolute: 0 10*3/uL (ref 0.0–0.7)
Eosinophils Relative: 0 %
HCT: 32.7 % — ABNORMAL LOW (ref 39.0–52.0)
HEMOGLOBIN: 11.3 g/dL — AB (ref 13.0–17.0)
LYMPHS ABS: 1 10*3/uL (ref 0.7–4.0)
LYMPHS PCT: 18 %
MCH: 33.1 pg (ref 26.0–34.0)
MCHC: 34.6 g/dL (ref 30.0–36.0)
MCV: 95.9 fL (ref 78.0–100.0)
MONOS PCT: 6 %
Monocytes Absolute: 0.3 10*3/uL (ref 0.1–1.0)
NEUTROS PCT: 76 %
Neutro Abs: 4 10*3/uL (ref 1.7–7.7)
Platelets: 164 10*3/uL (ref 150–400)
RBC: 3.41 MIL/uL — AB (ref 4.22–5.81)
RDW: 13.4 % (ref 11.5–15.5)
WBC: 5.3 10*3/uL (ref 4.0–10.5)

## 2017-06-07 LAB — BASIC METABOLIC PANEL
Anion gap: 5 (ref 5–15)
BUN: 9 mg/dL (ref 6–20)
CHLORIDE: 110 mmol/L (ref 101–111)
CO2: 27 mmol/L (ref 22–32)
Calcium: 8.5 mg/dL — ABNORMAL LOW (ref 8.9–10.3)
Creatinine, Ser: 0.7 mg/dL (ref 0.61–1.24)
GFR calc Af Amer: >60 mL/min (ref >60)
GFR calc non Af Amer: >60 mL/min (ref >60)
GLUCOSE: 105 mg/dL — AB (ref 65–99)
Potassium: 3.8 mmol/L (ref 3.5–5.1)
Sodium: 142 mmol/L (ref 135–145)

## 2017-06-07 MED ORDER — LEVOFLOXACIN 750 MG PO TABS
750.0000 mg | ORAL_TABLET | Freq: Every day | ORAL | Status: DC
  Administered 2017-06-07 – 2017-06-08 (×2): 750 mg via ORAL
  Filled 2017-06-07 (×2): qty 1

## 2017-06-07 MED ORDER — PREDNISONE 20 MG PO TABS
40.0000 mg | ORAL_TABLET | Freq: Every day | ORAL | Status: DC
  Administered 2017-06-08: 40 mg via ORAL
  Filled 2017-06-07: qty 2

## 2017-06-07 NOTE — Progress Notes (Signed)
PROGRESS NOTE  [name]. UYQ:[id number] DOB: Jul 30, 1980 DOA: 06/04/2017 PCP: Arnoldo Morale, MD  HPI/Recap of past 34 hours: 36 year old male with history significant of asthma, COPD, Crohn's disease, anxiety, bipolar, polysubstance abuse, was admitted for sepsis and acute hypoxic respiratory failure likely from right lower lobe pneumonia, after patient presented with shortness of breath, productive cough and subjective fever.  Patient also uses marijuana, cocaine and smokes approximately half a pack of cigarettes per day.  Today, patient more awake, ambulating was able to provide history.  Denies any chest pain, worsening shortness of breath, abdominal pain, nausea/vomiting, diarrhea, fever/chills. Patient still coughing, decreased air entry bilaterally. Patient noted to be agitated and stressed out about social issues.   Assessment/Plan: Active Problems:   Sepsis ([place])   Substance abuse ([place])   Alpha-1-antitrypsin deficiency ([place])   Neuropathy   Lobar pneumonia ([facility])   Sepsis, unspecified organism ([place])   Respiratory failure with hypoxia ([place])   Essential hypertension   #Sepsis Improving Likely from right lower lobe pneumonia Chest x-ray showed right lower lobe infiltrates Blood culture, no growth so far UA negative, urine strep pneumonia negative Lactic acid within normal limits D/C IV Vanco and Levaquin, IVF Switched patient to PO levaquin for a total of 10days upon discharge  #Acute hypoxic respiratory failure Improving Likely from right lower lobe pneumonia ??  COPD flare, history of alpha 1 antitrypsin deficiency D/C Solu-Medrol, switch to PO prednisone 29m, plan to taper Continue duo nebs, Dulera Chest PT Oxygen as needed, home O2 eval placed for RT  #Hypertension BP soft due to sepsis Restarted diltiazem at a lower dose 1226mmay increase as BP permits. Pt takes 30065maily at home  #Peripheral neuropathy Continue Neurontin  #Anxiety Continue  hydroxyzine, Klonopin  #Polysubstance/tobacco abuse Urine drug screen positive for opiates, cocaine.  Also positive for benzo but patient is taking Klonopin Advised against drug use Nicotine patch  Code Status: Full  Family Communication: None at bedside  Disposition Plan: Once stable   Consultants:  None  Procedures:  None  Antimicrobials:  IV Vanco stopped 11/14  IV Levaquin stopped 11/14  PO levaquin  DVT prophylaxis: Lovenox   Objective: Vitals:   06/07/17 0721 06/07/17 0724 06/07/17 0835 06/07/17 1151  BP: 131/79 131/79 115/75 125/83  Pulse: 87 97  (!) 103  Resp: 18 19  (!) 22  Temp:  98.1 F (36.7 C)  98.6 F (37 C)  TempSrc:  Oral  Oral  SpO2: 98% 97%  98%  Weight:      Height:        Intake/Output Summary (Last 24 hours) at 06/07/2017 1259 Last data filed at 06/07/2017 0300 Gross per 24 hour  Intake 2880 ml  Output -  Net 2880 ml   Filed Weights   06/04/17 1040 06/04/17 1630  Weight: 44.9 kg (99 lb) 44.9 kg (98 lb 15.8 oz)    Exam:   General: Alert, awake, oriented x3  Cardiovascular: Heart sounds S1-S2 present, no added heart sound  Respiratory: Diminished breath sounds bilaterally, mild wheezing, rhonchi noted  Abdomen: Soft, nontender, nondistended  Musculoskeletal: No bilateral pedal edema seen  Skin: Normal  Psychiatry: Anxious   Data Reviewed: CBC: Recent Labs  Lab 06/04/17 1115 06/05/17 0159 06/06/17 0229 06/07/17 0438  WBC 9.3 4.9 5.8 5.3  NEUTROABS 7.1  --  4.9 4.0  HGB 14.6 11.8* 10.8* 11.3*  HCT 41.3 34.3* 31.7* 32.7*  MCV 94.7 95.0 94.9 95.9  PLT 167 163 158 164  Basic Metabolic Panel: Recent Labs  Lab 06/04/17 1115 06/05/17 0159 06/06/17 0229 06/07/17 0438  NA 132* 136 139 142  K 3.5 3.3* 3.1* 3.8  CL 102 109 112* 110  CO2 19* 20* 24 27  GLUCOSE 130* 134* 180* 105*  BUN 7 11 12 9   CREATININE 0.89 0.90 0.73 0.70  CALCIUM 8.4* 8.0* 8.2* 8.5*   GFR: Estimated Creatinine Clearance: 80.3  mL/min (by C-G formula based on SCr of 0.7 mg/dL). Liver Function Tests: Recent Labs  Lab 06/04/17 1115 06/05/17 0159  AST 35 25  ALT 35 28  ALKPHOS 78 59  BILITOT 1.6* 0.7  PROT 6.1* 5.1*  ALBUMIN 3.1* 2.7*   No results for input(s): LIPASE, AMYLASE in the last 168 hours. No results for input(s): AMMONIA in the last 168 hours. Coagulation Profile: No results for input(s): INR, PROTIME in the last 168 hours. Cardiac Enzymes: No results for input(s): CKTOTAL, CKMB, CKMBINDEX, TROPONINI in the last 168 hours. BNP (last 3 results) No results for input(s): PROBNP in the last 8760 hours. HbA1C: No results for input(s): HGBA1C in the last 72 hours. CBG: No results for input(s): GLUCAP in the last 168 hours. Lipid Profile: No results for input(s): CHOL, HDL, LDLCALC, TRIG, CHOLHDL, LDLDIRECT in the last 72 hours. Thyroid Function Tests: No results for input(s): TSH, T4TOTAL, FREET4, T3FREE, THYROIDAB in the last 72 hours. Anemia Panel: No results for input(s): VITAMINB12, FOLATE, FERRITIN, TIBC, IRON, RETICCTPCT in the last 72 hours. Urine analysis:    Component Value Date/Time   COLORURINE YELLOW 06/05/2017 0235   APPEARANCEUR CLEAR 06/05/2017 0235   LABSPEC 1.011 06/05/2017 0235   PHURINE 6.0 06/05/2017 0235   GLUCOSEU 50 (A) 06/05/2017 0235   HGBUR NEGATIVE 06/05/2017 0235   BILIRUBINUR NEGATIVE 06/05/2017 0235   KETONESUR NEGATIVE 06/05/2017 0235   PROTEINUR NEGATIVE 06/05/2017 0235   UROBILINOGEN 1.0 10/05/2014 1501   NITRITE NEGATIVE 06/05/2017 0235   LEUKOCYTESUR NEGATIVE 06/05/2017 0235   Sepsis Labs: @LABRCNTIP (procalcitonin:4,lacticidven:4)  ) Recent Results (from the past 240 hour(s))  Blood Culture (routine x 2)     Status: None (Preliminary result)   Collection Time: 06/04/17 11:15 AM  Result Value Ref Range Status   Specimen Description BLOOD RIGHT ANTECUBITAL  Final   Special Requests   Final    BOTTLES DRAWN AEROBIC AND ANAEROBIC Blood Culture results  may not be optimal due to an inadequate volume of blood received in culture bottles   Culture NO GROWTH 2 DAYS  Final   Report Status PENDING  Incomplete  Blood Culture (routine x 2)     Status: None (Preliminary result)   Collection Time: 06/04/17 11:35 AM  Result Value Ref Range Status   Specimen Description BLOOD LEFT HAND  Final   Special Requests   Final    BOTTLES DRAWN AEROBIC AND ANAEROBIC Blood Culture adequate volume   Culture NO GROWTH 2 DAYS  Final   Report Status PENDING  Incomplete  MRSA PCR Screening     Status: None   Collection Time: 06/04/17  4:25 PM  Result Value Ref Range Status   MRSA by PCR NEGATIVE NEGATIVE Final    Comment:        The GeneXpert MRSA Assay (FDA approved for NASAL specimens only), is one component of a comprehensive MRSA colonization surveillance program. It is not intended to diagnose MRSA infection nor to guide or monitor treatment for MRSA infections.       Studies: No results found.  Scheduled Meds: .  diltiazem  120 mg Oral Daily  . enoxaparin (LOVENOX) injection  30 mg Subcutaneous Q24H  . feeding supplement (ENSURE ENLIVE)  237 mL Oral TID BM  . gabapentin  600 mg Oral BID  . levalbuterol  1.25 mg Nebulization TID  . levofloxacin  750 mg Oral Daily  . methylPREDNISolone (SOLU-MEDROL) injection  40 mg Intravenous Q12H  . mometasone-formoterol  2 puff Inhalation BID  . nicotine  21 mg Transdermal Daily  . tiotropium  18 mcg Inhalation Daily    Continuous Infusions:    LOS: 3 days     Alma Friendly, MD [facility]   If 7PM-7AM, please contact night-coverage [email or web address] Password [email or web address] 06/07/2017, 12:59 PM

## 2017-06-07 NOTE — Progress Notes (Signed)
Pt. Became verbally abusive demanding for all anti - anxiety and pain meds at this time. Explained to them the schedule  Of the meds but not listening claiming that the undersign is   not  Giving the meds he needs. Md aware.

## 2017-06-07 NOTE — Care Management Note (Addendum)
Case Management Note  Patient Details  Name: [name]. MRN: [id number] Date of Birth: 1981-07-08  Subjective/Objective:   Pt admitted with lower lobe PNA  Action/Plan:  PTA from home with family - positive for substance abuse - CSW consulted.  Pulm Ambulatory test performed.  CM gave choice for  DME - pt chose [facility]- CM informed [facility] of potential home oxygen and additional DME referral pending orders from MD. CM asked for [facility] and DME orders as recommended.   CM will continue to follow for discharge needs   Expected Discharge Date:                  Expected Discharge Plan:  Home/Self Care  In-House Referral:     Discharge planning Services  CM Consult  Post Acute Care Choice:    Choice offered to:     DME Arranged:    DME Agency:     HH Arranged:    HH Agency:     Status of Service:     If discussed at [employer] of Stay Meetings, dates discussed:    Additional Comments:  Maryclare Labrador, RN 06/07/2017, 3:14 PM

## 2017-06-07 NOTE — Progress Notes (Signed)
SATURATION QUALIFICATIONS: (This note is used to comply with regulatory documentation for home oxygen)  Patient Saturations on Room Air at Rest = 97%  Patient Saturations on Room Air while Ambulating = 86%  Patient Saturations on 1 Liters of oxygen while Ambulating = 93%  Please briefly explain why patient needs home oxygen: Pt desats and feels SOB while ambulating more than just a couple of feet, O2 sats improved to 93% when 1L of O2 was applied while ambulating.

## 2017-06-07 NOTE — Evaluation (Signed)
Occupational Therapy Evaluation Patient Details Name: [name]. MRN: [id number] DOB: 10-31-1980 Today's Date: 06/07/2017    History of Present Illness Pt is a 36 y.o. male with PMH significant for asthma, COPD, Crohn's disease, anxiety, bipolar, polysubstance abuse, was admitted on 06/04/17 for sepsis and acute hypoxic respiratory failure likely from right lower lobe pneumonia.   Clinical Impression   Pt admitted with above. He demonstrates the below listed deficits and will benefit from continued OT to maximize safety and independence with BADLs.  Pt presents to OT with generalized weakness, decreased activity tolerance, impaired balance, pain Lt calf.  He requires min guard assist for ADLs.  PTA, he lived with mother and roommates and was independent.  Mother is able to assist as needed at discharge.  Recommend tub seat at discharge.        Follow Up Recommendations  No OT follow up;Supervision/Assistance - 24 hour    Equipment Recommendations  Tub/shower seat    Recommendations for Other Services       Precautions / Restrictions Precautions Precautions: Fall      Mobility Bed Mobility Overal bed mobility: Independent                Transfers Overall transfer level: Needs assistance Equipment used: None Transfers: Sit to/from Stand;Stand Pivot Transfers Sit to Stand: Supervision Stand pivot transfers: Supervision            Balance Overall balance assessment: Needs assistance Sitting-balance support: Feet supported Sitting balance-Leahy Scale: Good     Standing balance support: During functional activity Standing balance-Leahy Scale: Fair                             ADL either performed or assessed with clinical judgement   ADL Overall ADL's : Needs assistance/impaired Eating/Feeding: Independent   Grooming: Wash/dry hands;Wash/dry face;Oral care;Brushing hair;Min guard;Standing   Upper Body Bathing: Set up;Supervision/  safety;Sitting   Lower Body Bathing: Min guard;Sit to/from stand   Upper Body Dressing : Set up;Sitting   Lower Body Dressing: Min guard;Sit to/from stand   Toilet Transfer: Set up;Supervision/safety;Ambulation;Comfort height toilet;Grab bars   Toileting- Clothing Manipulation and Hygiene: Min guard;Sit to/from stand       Functional mobility during ADLs: Min guard General ADL Comments: Pt requires rest breaks, and is mildly unsteady      Vision         Perception     Praxis      Pertinent Vitals/Pain Pain Assessment: 0-10 Pain Score: 6  Pain Location: L calf Pain Descriptors / Indicators: Aching Pain Intervention(s): Monitored during session;Repositioned     Hand Dominance     Extremity/Trunk Assessment Upper Extremity Assessment Upper Extremity Assessment: Generalized weakness   Lower Extremity Assessment Lower Extremity Assessment: Defer to PT evaluation   Cervical / Trunk Assessment Cervical / Trunk Assessment: Normal   Communication Communication Communication: No difficulties   Cognition Arousal/Alertness: Awake/alert Behavior During Therapy: WFL for tasks assessed/performed Overall Cognitive Status: Within Functional Limits for tasks assessed                                     General Comments  02 sats 88% on RA with activity, rebounded to low 90s quickly with seated rest break     Exercises     Shoulder Instructions      Home Living Family/patient expects  to be discharged to:: Private residence Living Arrangements: Parent Available Help at Discharge: Family;Available 24 hours/day Type of Home: House Home Access: Stairs to enter [employer] of Steps: 3   Home Layout: One level     Bathroom Shower/Tub: Teacher, early years/pre: Standard     Home Equipment: None   Additional Comments: Pt reports mother is his primary caregiver and available 24/7; has multiple roommates in addition to mother       Prior Functioning/Environment Level of Independence: Independent        Comments: Pt reports He rides the bus.          OT Problem List: Decreased strength;Decreased activity tolerance;Impaired balance (sitting and/or standing);Cardiopulmonary status limiting activity;Pain;Decreased knowledge of use of DME or AE      OT Treatment/Interventions: Self-care/ADL training;Therapeutic exercise;Energy conservation;DME and/or AE instruction;Therapeutic activities;Patient/family education;Balance training    OT Goals(Current goals can be found in the care plan section) Acute Rehab OT Goals Patient Stated Goal: Return home OT Goal Formulation: With patient Time For Goal Achievement: 06/21/17 Potential to Achieve Goals: Good ADL Goals Pt Will Perform Grooming: standing Pt Will Perform Upper Body Bathing: with modified independence;standing Pt Will Perform Lower Body Bathing: with modified independence;sit to/from stand Pt Will Perform Upper Body Dressing: with modified independence;sitting;standing Pt Will Perform Lower Body Dressing: with modified independence;sit to/from stand Pt Will Transfer to Toilet: with modified independence;ambulating;regular height toilet Pt Will Perform Toileting - Clothing Manipulation and hygiene: with modified independence;sit to/from stand Pt Will Perform Tub/Shower Transfer: Tub transfer;with supervision;shower seat  OT Frequency: Min 2X/week   Barriers to D/C:            Co-evaluation              AM-PAC PT "6 Clicks" Daily Activity     Outcome Measure Help from another person eating meals?: None Help from another person taking care of personal grooming?: A Little Help from another person toileting, which includes using toliet, bedpan, or urinal?: A Little Help from another person bathing (including washing, rinsing, drying)?: A Little Help from another person to put on and taking off regular upper body clothing?: A Little Help from another  person to put on and taking off regular lower body clothing?: A Little 6 Click Score: 19   End of Session Nurse Communication: Mobility status  Activity Tolerance: Patient tolerated treatment well Patient left: in bed;with call bell/phone within reach;with family/visitor present  OT Visit Diagnosis: Unsteadiness on feet (R26.81)                Time: 8592-9244 OT Time Calculation (min): 15 min Charges:  OT General Charges $OT Visit: 1 Visit OT Evaluation $OT Eval Moderate Complexity: 1 Mod G-Codes:     [facility], OTR/L [phone]   Lucille Passy M 06/07/2017, 11:49 AM

## 2017-06-07 NOTE — Progress Notes (Signed)
Physical Therapy Treatment Patient Details Name: [name]. MRN: [id number] DOB: 1981-01-26 Today's Date: 06/07/2017    History of Present Illness Pt is a 36 y.o. male with PMH significant for asthma, COPD, Crohn's disease, anxiety, bipolar, polysubstance abuse, was admitted on 06/04/17 for sepsis and acute hypoxic respiratory failure likely from right lower lobe pneumonia.   PT Comments    Today's session focused on gait training with SPC. Pt continues to require supervision for safety secondary to intermittent self-corrected instability with amb. SpO2 down to 86% on RA, increasing to >90% on 2L O2 [place] (RN aware). From a mobility perspective, feel pt is safe to return home with [facility] and supervision from mother. Will continue to follow acutely.   Follow Up Recommendations  No PT follow up;Supervision - Intermittent     Equipment Recommendations  Cane    Recommendations for Other Services       Precautions / Restrictions Precautions Precautions: Fall Restrictions Weight Bearing Restrictions: No    Mobility  Bed Mobility Overal bed mobility: Independent                Transfers Overall transfer level: Needs assistance Equipment used: None;Straight cane Transfers: Sit to/from Stand Sit to Stand: Supervision Stand pivot transfers: Supervision       General transfer comment: Assist for lines/leads  Ambulation/Gait Ambulation/Gait assistance: Supervision Ambulation Distance (Feet): 200 Feet Assistive device: Straight cane Gait Pattern/deviations: Step-through pattern;Decreased stride length Gait velocity: Decreased Gait velocity interpretation: <1.8 ft/sec, indicative of risk for recurrent falls General Gait Details: Educ on amb with SPC, with intermittent cues for technique. Required 2x standing rest break with SpO2 down to 86% on RA, increasing to >90% on 2L O2 [place]   Stairs            Wheelchair Mobility    Modified Rankin (Stroke Patients  Only)       Balance Overall balance assessment: Needs assistance Sitting-balance support: Feet supported Sitting balance-Leahy Scale: Good     Standing balance support: During functional activity Standing balance-Leahy Scale: Fair                              Cognition Arousal/Alertness: Awake/alert Behavior During Therapy: WFL for tasks assessed/performed Overall Cognitive Status: Within Functional Limits for tasks assessed                                        Exercises      General Comments General comments (skin integrity, edema, etc.): 02 sats 88% on RA with activity, rebounded to low 90s quickly with seated rest break       Pertinent Vitals/Pain Pain Assessment: Faces Pain Score: 6  Faces Pain Scale: Hurts a little bit Pain Location: R calf Pain Descriptors / Indicators: Sore Pain Intervention(s): Monitored during session    Home Living Family/patient expects to be discharged to:: Private residence Living Arrangements: Parent Available Help at Discharge: Family;Available 24 hours/day Type of Home: House Home Access: Stairs to enter   Home Layout: One level Home Equipment: None Additional Comments: Pt reports mother is his primary caregiver and available 24/7; has multiple roommates in addition to mother    Prior Function Level of Independence: Independent      Comments: Pt reports He rides the bus.     PT Goals (current goals can now be found  in the care plan section) Acute Rehab PT Goals Patient Stated Goal: Return home PT Goal Formulation: With patient Time For Goal Achievement: 06/20/17 Potential to Achieve Goals: Good Progress towards PT goals: Progressing toward goals    Frequency    Min 3X/week      PT Plan Current plan remains appropriate    Co-evaluation              AM-PAC PT "6 Clicks" Daily Activity  Outcome Measure  Difficulty turning over in bed (including adjusting bedclothes, sheets and  blankets)?: None Difficulty moving from lying on back to sitting on the side of the bed? : None Difficulty sitting down on and standing up from a chair with arms (e.g., wheelchair, bedside commode, etc,.)?: None Help needed moving to and from a bed to chair (including a wheelchair)?: A Little Help needed walking in hospital room?: A Little Help needed climbing 3-5 steps with a railing? : A Little 6 Click Score: 21    End of Session Equipment Utilized During Treatment: Gait belt;Oxygen Activity Tolerance: Patient tolerated treatment well Patient left: in bed;with call bell/phone within reach;with family/visitor present Nurse Communication: Mobility status PT Visit Diagnosis: Other abnormalities of gait and mobility (R26.89)     Time: 6599-3570 PT Time Calculation (min) (ACUTE ONLY): 17 min  Charges:  $Gait Training: 8-22 mins                    G Codes:      Mabeline Caras, PT, DPT Acute Rehab Services  Pager: [place] 06/07/2017, 2:30 PM

## 2017-06-07 NOTE — Plan of Care (Signed)
Spoke with the pt about relaxation techniques to reduce anxiety. The pt responded well to the suggestions.

## 2017-06-08 ENCOUNTER — Encounter (HOSPITAL_COMMUNITY): Payer: Self-pay

## 2017-06-08 DIAGNOSIS — J181 Lobar pneumonia, unspecified organism: Secondary | ICD-10-CM

## 2017-06-08 DIAGNOSIS — E8801 Alpha-1-antitrypsin deficiency: Secondary | ICD-10-CM

## 2017-06-08 LAB — BASIC METABOLIC PANEL
ANION GAP: 7 (ref 5–15)
BUN: 12 mg/dL (ref 6–20)
CALCIUM: 8.2 mg/dL — AB (ref 8.9–10.3)
CHLORIDE: 106 mmol/L (ref 101–111)
CO2: 26 mmol/L (ref 22–32)
Creatinine, Ser: 0.76 mg/dL (ref 0.61–1.24)
GFR calc non Af Amer: >60 mL/min (ref >60)
Glucose, Bld: 109 mg/dL — ABNORMAL HIGH (ref 65–99)
Potassium: 3.1 mmol/L — ABNORMAL LOW (ref 3.5–5.1)
SODIUM: 139 mmol/L (ref 135–145)

## 2017-06-08 LAB — CBC WITH DIFFERENTIAL/PLATELET
BASOS ABS: 0 10*3/uL (ref 0.0–0.1)
BASOS PCT: 1 %
EOS ABS: 0 10*3/uL (ref 0.0–0.7)
Eosinophils Relative: 0 %
HEMATOCRIT: 32.6 % — AB (ref 39.0–52.0)
HEMOGLOBIN: 11.1 g/dL — AB (ref 13.0–17.0)
Lymphocytes Relative: 26 %
Lymphs Abs: 1.7 10*3/uL (ref 0.7–4.0)
MCH: 32.4 pg (ref 26.0–34.0)
MCHC: 34 g/dL (ref 30.0–36.0)
MCV: 95 fL (ref 78.0–100.0)
Monocytes Absolute: 0.8 10*3/uL (ref 0.1–1.0)
Monocytes Relative: 13 %
NEUTROS ABS: 4 10*3/uL (ref 1.7–7.7)
NEUTROS PCT: 60 %
Platelets: 164 10*3/uL (ref 150–400)
RBC: 3.43 MIL/uL — ABNORMAL LOW (ref 4.22–5.81)
RDW: 13.5 % (ref 11.5–15.5)
WBC: 6.6 10*3/uL (ref 4.0–10.5)

## 2017-06-08 MED ORDER — GUAIFENESIN ER 600 MG PO TB12: 600.0000 mg | ORAL_TABLET | Freq: Two times a day (BID) | ORAL | 0 refills | Status: DC

## 2017-06-08 MED ORDER — GABAPENTIN 600 MG PO TABS: 600.0000 mg | ORAL_TABLET | Freq: Three times a day (TID) | ORAL | 0 refills | Status: AC

## 2017-06-08 MED ORDER — GUAIFENESIN ER 600 MG PO TB12
600.0000 mg | ORAL_TABLET | Freq: Two times a day (BID) | ORAL | Status: DC
Start: 2017-06-08 — End: 2017-06-08

## 2017-06-08 MED ORDER — ACETAMINOPHEN-CODEINE #3 300-30 MG PO TABS: 1.0000 | ORAL_TABLET | Freq: Three times a day (TID) | ORAL | 0 refills | Status: DC | PRN

## 2017-06-08 MED ORDER — LEVOFLOXACIN 750 MG PO TABS: 750.0000 mg | ORAL_TABLET | Freq: Every day | ORAL | 0 refills | Status: DC

## 2017-06-08 MED ORDER — BENZONATATE 100 MG PO CAPS: 200.0000 mg | ORAL_CAPSULE | Freq: Three times a day (TID) | ORAL | Status: DC

## 2017-06-08 MED ORDER — ENSURE ENLIVE PO LIQD: 237.0000 mL | Freq: Three times a day (TID) | ORAL | 12 refills | Status: DC

## 2017-06-08 MED ORDER — PREDNISONE 10 MG PO TABS: ORAL_TABLET | ORAL | 0 refills | Status: DC

## 2017-06-08 MED ORDER — MENTHOL 3 MG MT LOZG: 1.0000 | LOZENGE | OROMUCOSAL | Status: DC | PRN

## 2017-06-08 MED ORDER — DILTIAZEM HCL ER COATED BEADS 120 MG PO CP24: 120.0000 mg | ORAL_CAPSULE | Freq: Every day | ORAL | 0 refills | Status: DC

## 2017-06-08 MED ORDER — BENZONATATE 200 MG PO CAPS: 200.0000 mg | ORAL_CAPSULE | Freq: Three times a day (TID) | ORAL | 0 refills | Status: DC

## 2017-06-08 MED ORDER — POTASSIUM CHLORIDE CRYS ER 20 MEQ PO TBCR
40.0000 meq | EXTENDED_RELEASE_TABLET | ORAL | Status: AC
  Administered 2017-06-08 (×2): 40 meq via ORAL
  Filled 2017-06-08 (×2): qty 2

## 2017-06-08 NOTE — Progress Notes (Signed)
Pt oxygen saturation noted to be 85% on RA.  Pt has been on RA most of the evening as pt has not worn oxygen throughout the night and stated "I don't need it right now."  Pt placed on 2L [place] with oxygen saturation improving to 97%.  Noted audible wheezing from patient.  Respiratory therapy notified for PRN neb.  Pt denies any shortness of breath.  Pt able to speak full sentences and no use of accessory muscles or tachypnea noted.  Pt requesting increase to 5L [place] of oxygen for wheezing.  Pt educated on oxygen use and oxygen saturation.  Pt continued to deny SOB and stated "since I'm wheezing just turn the oxygen up." Pt educated on neb treatments and oxygen therapy.

## 2017-06-08 NOTE — Discharge Instructions (Signed)

## 2017-06-08 NOTE — Plan of Care (Signed)
PT discharging to home, all discharge instructions provided to patient with mother at bedside, no questions or concerns at this time, cab vouchers provided to patient for ride to pharmacy and from pharmacy to home, O2 tank and cane have been delivered to patients room.

## 2017-06-08 NOTE — Discharge Summary (Signed)
[facility] Discharge Summary   Patient: [name]. UMP:[id number]   PCP: Arnoldo Morale, MD DOB: 1981-05-07   Date of admission: 06/04/2017   Date of discharge:  06/08/2017    Discharge Diagnoses:  Active Problems:   Sepsis ([place])   Substance abuse ([place])   Alpha-1-antitrypsin deficiency ([place])   Neuropathy   Lobar pneumonia ([facility])   Sepsis, unspecified organism ([place])   Respiratory failure with hypoxia ([place])   Essential hypertension   Admitted From: home Disposition:  home  Recommendations for Outpatient Follow-up:  1. Please follow up with PCP and pulmonary as recommended  2. Quit smoking  Follow-up Information    Arnoldo Morale, MD. Schedule an appointment as soon as possible for a visit in 1 week(s).   Specialty:  Family Medicine Contact information: [place]  [phone]        pulmonary. Go on 06/18/2017.          Diet recommendation: regular deit  Activity: The patient is advised to gradually reintroduce usual activities.  Discharge Condition: good  Code Status: full code  History of present illness: As per the H and P dictated on admission, "Clifford Ollis. is a 36 y.o. male with medical history significant of Anxiety, asthma, bipolar, COPD, Crohns disease, Nephrolithiasis, drug abuse.  Level V caveat applies as patient is unable to provide any history this time due to somnolence state due to current infectious process. Limited history provided by report given to EDP from patient, patient's mother.  Patient with several day history of progressive shortness of breath. Associated with intermittent productive cough and subjective fevers. Patient also complaining of diffuse chest discomfort with coughing and occasional body aches. Patient continues to use marijuana and cocaine from time to time. Smokes approximately a half pack of cigarettes per day.  Unable to obtain further review of systems or  history."  Hospital Course:  Summary of his active problems in the hospital is as following. Sepsis Resolved  from right lower lobe pneumonia Chest x-ray showed right lower lobe infiltrates Blood culture, no growth so far UA negative, urine strep pneumonia negative Lactic acid within normal limits Switched patient to PO levaquin total 10 days, 3 more day on discharge.   #Acute hypoxic respiratory failure Improving Likely from right lower lobe pneumonia ??  COPD flare, history of alpha 1 antitrypsin deficiency D/C Solu-Medrol, switch to PO prednisone 84m, plan to taper Continue duo nebs, Dulera Chest PT Oxygen as needed, home O2 eval placed for RT Follow up with pulmonary   #Hypertension BP soft due to sepsis Restarted diltiazem at a lower dose 1276mmay increase as BP permits. Pt takes 30079maily at home  #Peripheral neuropathy Continue Neurontin, increase dose for pain control  #Anxiety Continue hydroxyzine, Klonopin  #Polysubstance/tobacco abuse Urine drug screen positive for opiates, cocaine.  Also positive for benzo but patient is taking Klonopin Advised against drug use Nicotine patch  All other chronic medical condition were stable during the hospitalization.  Patient was seen by physical therapy, who recommended no PT follow up needed. On the day of the discharge the patient's vitals were stable, and no other acute medical condition were reported by patient. the patient was felt safe to be discharge at home with family.  Procedures and Results:  none   Consultations:  none  DISCHARGE MEDICATION: Current Discharge Medication List    START taking these medications   Details  acetaminophen-codeine (TYLENOL #3) 300-30 MG tablet Take 1  tablet every 8 (eight) hours as needed by mouth for severe pain. Qty: 10 tablet, Refills: 0    benzonatate (TESSALON) 200 MG capsule Take 1 capsule (200 mg total) 3 (three) times daily by mouth. Qty: 20 capsule,  Refills: 0    diltiazem (CARDIZEM CD) 120 MG 24 hr capsule Take 1 capsule (120 mg total) daily by mouth. Qty: 30 capsule, Refills: 0    feeding supplement, ENSURE ENLIVE, (ENSURE ENLIVE) LIQD Take 237 mLs 3 (three) times daily between meals by mouth. Qty: 237 mL, Refills: 12    guaiFENesin (MUCINEX) 600 MG 12 hr tablet Take 1 tablet (600 mg total) 2 (two) times daily by mouth. Qty: 60 tablet, Refills: 0    levofloxacin (LEVAQUIN) 750 MG tablet Take 1 tablet (750 mg total) daily by mouth. Qty: 3 tablet, Refills: 0    predniSONE (DELTASONE) 10 MG tablet Take 27m daily for 3days,Take 350mdaily for 3days,Take 2032maily for 3days,Take 64m30mily for 3days, then stop. Qty: 30 tablet, Refills: 0      CONTINUE these medications which have CHANGED   Details  gabapentin (NEURONTIN) 600 MG tablet Take 1 tablet (600 mg total) 3 (three) times daily by mouth. Qty: 90 tablet, Refills: 0      CONTINUE these medications which have NOT CHANGED   Details  albuterol (PROVENTIL) (2.5 MG/3ML) 0.083% nebulizer solution Take 3 mLs (2.5 mg total) by nebulization every 6 (six) hours as needed for wheezing or shortness of breath. Qty: 75 mL, Refills: 12    Aspirin-Salicylamide-Caffeine (BC HEADACHE POWDER PO) Take 1 Package daily by mouth.    budesonide-formoterol (SYMBICORT) 160-4.5 MCG/ACT inhaler Inhale 2 puffs into the lungs 2 (two) times daily. Qty: 1 Inhaler, Refills: 11    clonazePAM (KLONOPIN) 0.5 MG tablet Take 0.5 mg 3 (three) times daily by mouth.     hydrOXYzine (ATARAX/VISTARIL) 25 MG tablet Take 1 tablet (25 mg total) by mouth every 6 (six) hours as needed for anxiety. Qty: 30 tablet, Refills: 0    nicotine (NICODERM CQ - DOSED IN MG/24 HOURS) 21 mg/24hr patch Place 21 mg daily onto the skin.    ondansetron (ZOFRAN ODT) 4 MG disintegrating tablet Take 1 tablet (4 mg total) by mouth every 8 (eight) hours as needed for nausea or vomiting. Qty: 40 tablet, Refills: 1    PROAIR HFA 108  (90 Base) MCG/ACT inhaler INHALE 2 PUFFS BY MOUTH EVERY 6 HOURS AS NEEDED FOR WHEEZING AND SHORTNESS OF BREATH Qty: 8.5 g, Refills: 0    promethazine (PHENERGAN) 25 MG tablet Take 25 mg daily as needed by mouth. Refills: 0    Tiotropium Bromide Monohydrate (SPIRIVA RESPIMAT) 2.5 MCG/ACT AERS Inhale 2 puffs into the lungs daily. Qty: 1 Inhaler, Refills: 11      STOP taking these medications     diltiazem (TIAZAC) 300 MG 24 hr capsule      gabapentin (NEURONTIN) 300 MG capsule        Allergies  Allergen Reactions  . Penicillins Anaphylaxis and Swelling    Has patient had a PCN reaction causing immediate rash, facial/tongue/throat swelling, SOB or lightheadedness with hypotension: Yes Has patient had a PCN reaction causing severe rash involving mucus membranes or skin necrosis: No Has patient had a PCN reaction that required hospitalization No Has patient had a PCN reaction occurring within the last 10 years: No If all of the above answers are "NO", then may proceed with Cephalosporin use.   . Diphenhydramine Hcl Hives and Rash  Discharge Instructions    Diet - low sodium heart healthy   Complete by:  As directed    Discharge instructions   Complete by:  As directed    It is important that you read following instructions as well as go over your medication list with RN to help you understand your care after this hospitalization.  Discharge Instructions: Please follow-up with PCP in one week  Please request your primary care physician to go over all Hospital Tests and Procedure/Radiological results at the follow up,  Please get all Hospital records sent to your PCP by signing hospital release before you go home.   Do not drive, operating heavy machinery, perform activities at heights, swimming or participation in water activities or provide baby sitting services while you are on Pain, Sleep and Anxiety Medications; until you have been seen by Primary Care Physician or a  Neurologist and advised to do so again. Do not take more than prescribed Pain, Sleep and Anxiety Medications. You were cared for by a hospitalist during your hospital stay. If you have any questions about your discharge medications or the care you received while you were in the hospital after you are discharged, you can call the unit and ask to speak with the hospitalist on call if the hospitalist that took care of you is not available.  Once you are discharged, your primary care physician will handle any further medical issues. Please note that NO REFILLS for any discharge medications will be authorized once you are discharged, as it is imperative that you return to your primary care physician (or establish a relationship with a primary care physician if you do not have one) for your aftercare needs so that they can reassess your need for medications and monitor your lab values. You Must read complete instructions/literature along with all the possible adverse reactions/side effects for all the Medicines you take and that have been prescribed to you. Take any new Medicines after you have completely understood and accept all the possible adverse reactions/side effects. Wear Seat belts while driving. If you have smoked or chewed Tobacco in the last 2 yrs please stop smoking and/or stop any Recreational drug use.   Increase activity slowly   Complete by:  As directed      Discharge Exam: Filed Weights   06/04/17 1040 06/04/17 1630  Weight: 44.9 kg (99 lb) 44.9 kg (98 lb 15.8 oz)   Vitals:   06/08/17 0743 06/08/17 0746  BP: (!) 132/93 125/87  Pulse: 87   Resp: (!) 22   Temp:  98.2 F (36.8 C)  SpO2: 96%    General: Appear in mild distress, no Rash; Oral Mucosa moist. Cardiovascular: S1 and S2 Present, no Murmur, no JVD Respiratory: Bilateral Air entry present and basal Crackles, no wheezes Abdomen: Bowel Sound present, Soft and no tenderness Extremities: no Pedal edema, no calf  tenderness Neurology: Grossly no focal neuro deficit.  The results of significant diagnostics from this hospitalization (including imaging, microbiology, ancillary and laboratory) are listed below for reference.    Significant Diagnostic Studies: Dg Chest Portable 1 View  Result Date: 06/04/2017 CLINICAL DATA:  Respiratory distress with cough. EXAM: PORTABLE CHEST 1 VIEW COMPARISON:  03/01/2017 FINDINGS: Chronic hyperinflation. Emphysema by radiography and CT. There is asymmetric density at the right base with costophrenic sulcus blunting. Normal heart size and mediastinal contours. EKG leads create artifact across the chest. IMPRESSION: 1. Increased markings at the right base which could reflect pneumonia or atelectasis. Trace right  pleural effusion. 2. Emphysema. Electronically Signed   By: Monte Fantasia M.D.   On: 06/04/2017 11:35    Microbiology: Recent Results (from the past 240 hour(s))  Respiratory Panel by PCR     Status: None   Collection Time: 06/04/17 10:48 AM  Result Value Ref Range Status   Adenovirus NOT DETECTED NOT DETECTED Final   Coronavirus 229E NOT DETECTED NOT DETECTED Final   Coronavirus HKU1 NOT DETECTED NOT DETECTED Final   Coronavirus NL63 NOT DETECTED NOT DETECTED Final   Coronavirus OC43 NOT DETECTED NOT DETECTED Final   Metapneumovirus NOT DETECTED NOT DETECTED Final   Rhinovirus / Enterovirus NOT DETECTED NOT DETECTED Final   Influenza A NOT DETECTED NOT DETECTED Final   Influenza B NOT DETECTED NOT DETECTED Final   Parainfluenza Virus 1 NOT DETECTED NOT DETECTED Final   Parainfluenza Virus 2 NOT DETECTED NOT DETECTED Final   Parainfluenza Virus 3 NOT DETECTED NOT DETECTED Final   Parainfluenza Virus 4 NOT DETECTED NOT DETECTED Final   Respiratory Syncytial Virus NOT DETECTED NOT DETECTED Final   Bordetella pertussis NOT DETECTED NOT DETECTED Final   Chlamydophila pneumoniae NOT DETECTED NOT DETECTED Final   Mycoplasma pneumoniae NOT DETECTED NOT  DETECTED Final  Blood Culture (routine x 2)     Status: None (Preliminary result)   Collection Time: 06/04/17 11:15 AM  Result Value Ref Range Status   Specimen Description BLOOD RIGHT ANTECUBITAL  Final   Special Requests   Final    BOTTLES DRAWN AEROBIC AND ANAEROBIC Blood Culture results may not be optimal due to an inadequate volume of blood received in culture bottles   Culture NO GROWTH 3 DAYS  Final   Report Status PENDING  Incomplete  Blood Culture (routine x 2)     Status: None (Preliminary result)   Collection Time: 06/04/17 11:35 AM  Result Value Ref Range Status   Specimen Description BLOOD LEFT HAND  Final   Special Requests   Final    BOTTLES DRAWN AEROBIC AND ANAEROBIC Blood Culture adequate volume   Culture NO GROWTH 3 DAYS  Final   Report Status PENDING  Incomplete  MRSA PCR Screening     Status: None   Collection Time: 06/04/17  4:25 PM  Result Value Ref Range Status   MRSA by PCR NEGATIVE NEGATIVE Final    Comment:        The GeneXpert MRSA Assay (FDA approved for NASAL specimens only), is one component of a comprehensive MRSA colonization surveillance program. It is not intended to diagnose MRSA infection nor to guide or monitor treatment for MRSA infections.      Labs: CBC: Recent Labs  Lab 06/04/17 1115 06/05/17 0159 06/06/17 0229 06/07/17 0438 06/08/17 0245  WBC 9.3 4.9 5.8 5.3 6.6  NEUTROABS 7.1  --  4.9 4.0 4.0  HGB 14.6 11.8* 10.8* 11.3* 11.1*  HCT 41.3 34.3* 31.7* 32.7* 32.6*  MCV 94.7 95.0 94.9 95.9 95.0  PLT 167 163 158 164 453   Basic Metabolic Panel: Recent Labs  Lab 06/04/17 1115 06/05/17 0159 06/06/17 0229 06/07/17 0438 06/08/17 0245  NA 132* 136 139 142 139  K 3.5 3.3* 3.1* 3.8 3.1*  CL 102 109 112* 110 106  CO2 19* 20* 24 27 26   GLUCOSE 130* 134* 180* 105* 109*  BUN 7 11 12 9 12   CREATININE 0.89 0.90 0.73 0.70 0.76  CALCIUM 8.4* 8.0* 8.2* 8.5* 8.2*   Liver Function Tests: Recent Labs  Lab 06/04/17 1115  06/05/17  0159  AST 35 25  ALT 35 28  ALKPHOS 78 59  BILITOT 1.6* 0.7  PROT 6.1* 5.1*  ALBUMIN 3.1* 2.7*   Time spent: 35 minutes  Signed:  Berle Mull  [facility]  06/08/2017  , 11:32 AM

## 2017-06-08 NOTE — Care Management Note (Signed)
Case Management Note  Patient Details  Name: [name]. MRN: [id number] Date of Birth: [birth year]  Subjective/Objective:                    Action/Plan: PT/OT with no f/u. CM consulted for cane and home oxygen. CM spoke to [place] with [facility] DME and they are working on arranging home oxygen and will provide the patient with a portable tank to get home. They also will deliver a cane to the room. Pt needs assistance with transportation from hospital to pharmacy and then to his home. CSW notified to see about arranging cab vouchers.  Pt inquiring about assistance with different housing. CM referred him to his DSS CM through his Medicaid. Pt voiced understanding.  Expected Discharge Date:  06/08/17               Expected Discharge Plan:  Home/Self Care  In-House Referral:     Discharge planning Services  CM Consult  Post Acute Care Choice:  Durable Medical Equipment Choice offered to:  Patient  DME Arranged:  Kasandra Knudsen, Oxygen DME Agency:  [place]:    [facility] Agency:     Status of Service:  Completed, signed off  If discussed at [place] of Stay Meetings, dates discussed:    Additional Comments:  Pollie Friar, RN 06/08/2017, 1:01 PM

## 2017-06-08 NOTE — Progress Notes (Signed)
Clinical Social Worker was consulted by MD/RN in regards to assisting patient with transportation home. CSW met patient at bedside and assisted him with a cab voucher to [place]. Patient stated he has a way to get into his apartment. Cab voucher was given to RN until patient is ready for discharge. CSW signing off as patient no longer has social work needs.   Rhea Pink, MSW,  [place]

## 2017-06-09 LAB — CULTURE, BLOOD (ROUTINE X 2)
CULTURE: NO GROWTH
Culture: NO GROWTH
SPECIAL REQUESTS: ADEQUATE

## 2017-07-13 ENCOUNTER — Other Ambulatory Visit: Payer: Self-pay | Admitting: Internal Medicine

## 2017-08-19 ENCOUNTER — Inpatient Hospital Stay (HOSPITAL_COMMUNITY)
Admission: EM | Admit: 2017-08-19 | Discharge: 2017-08-23 | DRG: 190 | Disposition: A | Discharge status: Home / Self Care | Payer: Medicaid Other | Attending: Internal Medicine | Admitting: Internal Medicine

## 2017-08-19 ENCOUNTER — Emergency Department (HOSPITAL_COMMUNITY): Payer: Medicaid Other

## 2017-08-19 ENCOUNTER — Other Ambulatory Visit: Payer: Self-pay

## 2017-08-19 ENCOUNTER — Encounter (HOSPITAL_COMMUNITY): Payer: Self-pay

## 2017-08-19 DIAGNOSIS — B1920 Unspecified viral hepatitis C without hepatic coma: Secondary | ICD-10-CM | POA: Diagnosis present

## 2017-08-19 DIAGNOSIS — G629 Polyneuropathy, unspecified: Secondary | ICD-10-CM | POA: Diagnosis present

## 2017-08-19 DIAGNOSIS — Z9049 Acquired absence of other specified parts of digestive tract: Secondary | ICD-10-CM | POA: Diagnosis not present

## 2017-08-19 DIAGNOSIS — J9621 Acute and chronic respiratory failure with hypoxia: Secondary | ICD-10-CM | POA: Diagnosis present

## 2017-08-19 DIAGNOSIS — Z79899 Other long term (current) drug therapy: Secondary | ICD-10-CM

## 2017-08-19 DIAGNOSIS — F310 Bipolar disorder, current episode hypomanic: Secondary | ICD-10-CM | POA: Diagnosis not present

## 2017-08-19 DIAGNOSIS — E8801 Alpha-1-antitrypsin deficiency: Secondary | ICD-10-CM | POA: Diagnosis present

## 2017-08-19 DIAGNOSIS — F1410 Cocaine abuse, uncomplicated: Secondary | ICD-10-CM | POA: Diagnosis present

## 2017-08-19 DIAGNOSIS — J441 Chronic obstructive pulmonary disease with (acute) exacerbation: Secondary | ICD-10-CM | POA: Diagnosis present

## 2017-08-19 DIAGNOSIS — Z9981 Dependence on supplemental oxygen: Secondary | ICD-10-CM

## 2017-08-19 DIAGNOSIS — F419 Anxiety disorder, unspecified: Secondary | ICD-10-CM | POA: Diagnosis present

## 2017-08-19 DIAGNOSIS — F319 Bipolar disorder, unspecified: Secondary | ICD-10-CM | POA: Diagnosis present

## 2017-08-19 DIAGNOSIS — F1910 Other psychoactive substance abuse, uncomplicated: Secondary | ICD-10-CM | POA: Diagnosis present

## 2017-08-19 DIAGNOSIS — K50819 Crohn's disease of both small and large intestine with unspecified complications: Secondary | ICD-10-CM | POA: Diagnosis not present

## 2017-08-19 DIAGNOSIS — I10 Essential (primary) hypertension: Secondary | ICD-10-CM | POA: Diagnosis present

## 2017-08-19 DIAGNOSIS — F3177 Bipolar disorder, in partial remission, most recent episode mixed: Secondary | ICD-10-CM | POA: Diagnosis not present

## 2017-08-19 DIAGNOSIS — R3129 Other microscopic hematuria: Secondary | ICD-10-CM | POA: Diagnosis present

## 2017-08-19 DIAGNOSIS — J4520 Mild intermittent asthma, uncomplicated: Secondary | ICD-10-CM | POA: Diagnosis not present

## 2017-08-19 DIAGNOSIS — J45909 Unspecified asthma, uncomplicated: Secondary | ICD-10-CM | POA: Diagnosis present

## 2017-08-19 DIAGNOSIS — K5090 Crohn's disease, unspecified, without complications: Secondary | ICD-10-CM | POA: Diagnosis present

## 2017-08-19 DIAGNOSIS — F17210 Nicotine dependence, cigarettes, uncomplicated: Secondary | ICD-10-CM | POA: Diagnosis present

## 2017-08-19 DIAGNOSIS — D72825 Bandemia: Secondary | ICD-10-CM | POA: Diagnosis not present

## 2017-08-19 DIAGNOSIS — R059 Cough, unspecified: Secondary | ICD-10-CM

## 2017-08-19 DIAGNOSIS — R0602 Shortness of breath: Secondary | ICD-10-CM

## 2017-08-19 DIAGNOSIS — R05 Cough: Secondary | ICD-10-CM

## 2017-08-19 LAB — RAPID URINE DRUG SCREEN, HOSP PERFORMED
AMPHETAMINES: NOT DETECTED
BENZODIAZEPINES: NOT DETECTED
Barbiturates: NOT DETECTED
Cocaine: POSITIVE — AB
OPIATES: POSITIVE — AB
TETRAHYDROCANNABINOL: NOT DETECTED

## 2017-08-19 LAB — CBC
HCT: 43.7 % (ref 39.0–52.0)
Hemoglobin: 14.8 g/dL (ref 13.0–17.0)
MCH: 33 pg (ref 26.0–34.0)
MCHC: 33.9 g/dL (ref 30.0–36.0)
MCV: 97.3 fL (ref 78.0–100.0)
PLATELETS: 204 10*3/uL (ref 150–400)
RBC: 4.49 MIL/uL (ref 4.22–5.81)
RDW: 13 % (ref 11.5–15.5)
WBC: 9.2 10*3/uL (ref 4.0–10.5)

## 2017-08-19 LAB — URINALYSIS, ROUTINE W REFLEX MICROSCOPIC
BACTERIA UA: NONE SEEN
Bilirubin Urine: NEGATIVE
GLUCOSE, UA: NEGATIVE mg/dL
Ketones, ur: NEGATIVE mg/dL
Leukocytes, UA: NEGATIVE
Nitrite: NEGATIVE
PROTEIN: NEGATIVE mg/dL
SPECIFIC GRAVITY, URINE: 1.011 (ref 1.005–1.030)
Squamous Epithelial / LPF: NONE SEEN
pH: 6 (ref 5.0–8.0)

## 2017-08-19 LAB — CBC WITH DIFFERENTIAL/PLATELET
BASOS PCT: 0 %
Basophils Absolute: 0 10*3/uL (ref 0.0–0.1)
EOS ABS: 0.3 10*3/uL (ref 0.0–0.7)
EOS PCT: 2 %
HCT: 45.5 % (ref 39.0–52.0)
HEMOGLOBIN: 15.3 g/dL (ref 13.0–17.0)
Lymphocytes Relative: 12 %
Lymphs Abs: 1.6 10*3/uL (ref 0.7–4.0)
MCH: 32.5 pg (ref 26.0–34.0)
MCHC: 33.6 g/dL (ref 30.0–36.0)
MCV: 96.6 fL (ref 78.0–100.0)
MONOS PCT: 9 %
Monocytes Absolute: 1.2 10*3/uL — ABNORMAL HIGH (ref 0.1–1.0)
NEUTROS PCT: 77 %
Neutro Abs: 10.3 10*3/uL — ABNORMAL HIGH (ref 1.7–7.7)
Platelets: 217 10*3/uL (ref 150–400)
RBC: 4.71 MIL/uL (ref 4.22–5.81)
RDW: 13 % (ref 11.5–15.5)
WBC: 13.4 10*3/uL — AB (ref 4.0–10.5)

## 2017-08-19 LAB — COMPREHENSIVE METABOLIC PANEL
ALK PHOS: 98 U/L (ref 38–126)
ALT: 52 U/L (ref 17–63)
AST: 39 U/L (ref 15–41)
Albumin: 3.8 g/dL (ref 3.5–5.0)
Anion gap: 11 (ref 5–15)
BUN: 5 mg/dL — ABNORMAL LOW (ref 6–20)
CALCIUM: 9.1 mg/dL (ref 8.9–10.3)
CO2: 21 mmol/L — ABNORMAL LOW (ref 22–32)
CREATININE: 0.69 mg/dL (ref 0.61–1.24)
Chloride: 106 mmol/L (ref 101–111)
Glucose, Bld: 88 mg/dL (ref 65–99)
Potassium: 3.9 mmol/L (ref 3.5–5.1)
Sodium: 138 mmol/L (ref 135–145)
Total Bilirubin: 0.7 mg/dL (ref 0.3–1.2)
Total Protein: 7 g/dL (ref 6.5–8.1)

## 2017-08-19 LAB — INFLUENZA PANEL BY PCR (TYPE A & B)
INFLAPCR: NEGATIVE
INFLBPCR: NEGATIVE

## 2017-08-19 LAB — CREATININE, SERUM
Creatinine, Ser: 0.64 mg/dL (ref 0.61–1.24)
GFR calc Af Amer: >60 mL/min (ref >60)
GFR calc non Af Amer: >60 mL/min (ref >60)

## 2017-08-19 LAB — I-STAT CG4 LACTIC ACID, ED: Lactic Acid, Venous: 1.08 mmol/L (ref 0.5–1.9)

## 2017-08-19 MED ORDER — ALBUTEROL SULFATE (2.5 MG/3ML) 0.083% IN NEBU: 2.5000 mg | INHALATION_SOLUTION | Freq: Four times a day (QID) | RESPIRATORY_TRACT | Status: DC

## 2017-08-19 MED ORDER — ACETAMINOPHEN 650 MG RE SUPP: 650.0000 mg | Freq: Four times a day (QID) | RECTAL | Status: DC | PRN

## 2017-08-19 MED ORDER — GABAPENTIN 600 MG PO TABS
600.0000 mg | ORAL_TABLET | Freq: Three times a day (TID) | ORAL | Status: DC
  Administered 2017-08-19 – 2017-08-23 (×12): 600 mg via ORAL
  Filled 2017-08-19 (×12): qty 1

## 2017-08-19 MED ORDER — METHYLPREDNISOLONE SODIUM SUCC 125 MG IJ SOLR
125.0000 mg | Freq: Once | INTRAMUSCULAR | Status: AC
  Administered 2017-08-19: 125 mg via INTRAVENOUS
  Filled 2017-08-19: qty 2

## 2017-08-19 MED ORDER — HYDROCOD POLST-CPM POLST ER 10-8 MG/5ML PO SUER
5.0000 mL | Freq: Once | ORAL | Status: AC
  Administered 2017-08-19: 5 mL via ORAL
  Filled 2017-08-19: qty 5

## 2017-08-19 MED ORDER — ACETAMINOPHEN 325 MG PO TABS
650.0000 mg | ORAL_TABLET | Freq: Four times a day (QID) | ORAL | Status: DC | PRN
  Administered 2017-08-22 – 2017-08-23 (×4): 650 mg via ORAL
  Filled 2017-08-19 (×4): qty 2

## 2017-08-19 MED ORDER — KETOROLAC TROMETHAMINE 30 MG/ML IJ SOLN
15.0000 mg | Freq: Once | INTRAMUSCULAR | Status: AC
  Administered 2017-08-19: 15 mg via INTRAVENOUS
  Filled 2017-08-19: qty 1

## 2017-08-19 MED ORDER — ONDANSETRON HCL 4 MG/2ML IJ SOLN: 4.0000 mg | Freq: Four times a day (QID) | INTRAMUSCULAR | Status: DC | PRN

## 2017-08-19 MED ORDER — DILTIAZEM HCL ER COATED BEADS 300 MG PO CP24
300.0000 mg | ORAL_CAPSULE | Freq: Every day | ORAL | Status: DC
  Administered 2017-08-20 – 2017-08-23 (×4): 300 mg via ORAL
  Filled 2017-08-19 (×4): qty 1

## 2017-08-19 MED ORDER — SODIUM CHLORIDE 0.9 % IV BOLUS (SEPSIS)
1000.0000 mL | Freq: Once | INTRAVENOUS | Status: AC
  Administered 2017-08-19: 1000 mL via INTRAVENOUS

## 2017-08-19 MED ORDER — ONDANSETRON HCL 4 MG PO TABS
4.0000 mg | ORAL_TABLET | Freq: Four times a day (QID) | ORAL | Status: DC | PRN
  Administered 2017-08-21: 4 mg via ORAL
  Filled 2017-08-19: qty 1

## 2017-08-19 MED ORDER — CLONAZEPAM 0.5 MG PO TABS
0.5000 mg | ORAL_TABLET | Freq: Three times a day (TID) | ORAL | Status: DC
  Administered 2017-08-19 – 2017-08-23 (×12): 0.5 mg via ORAL
  Filled 2017-08-19 (×12): qty 1

## 2017-08-19 MED ORDER — CLONAZEPAM 0.5 MG PO TBDP: 0.5000 mg | ORAL_TABLET | Freq: Three times a day (TID) | ORAL | Status: DC

## 2017-08-19 MED ORDER — IBUPROFEN 400 MG PO TABS
400.0000 mg | ORAL_TABLET | Freq: Four times a day (QID) | ORAL | Status: DC | PRN
  Administered 2017-08-19 – 2017-08-21 (×5): 400 mg via ORAL
  Filled 2017-08-19 (×5): qty 1

## 2017-08-19 MED ORDER — HYDROCOD POLST-CPM POLST ER 10-8 MG/5ML PO SUER
5.0000 mL | Freq: Two times a day (BID) | ORAL | Status: DC | PRN
  Administered 2017-08-19 – 2017-08-20 (×2): 5 mL via ORAL
  Filled 2017-08-19 (×2): qty 5

## 2017-08-19 MED ORDER — LEVOFLOXACIN 750 MG PO TABS
750.0000 mg | ORAL_TABLET | Freq: Every day | ORAL | Status: DC
  Administered 2017-08-19 – 2017-08-20 (×2): 750 mg via ORAL
  Filled 2017-08-19 (×2): qty 1

## 2017-08-19 MED ORDER — PREDNISONE 20 MG PO TABS
40.0000 mg | ORAL_TABLET | Freq: Every day | ORAL | Status: DC
  Administered 2017-08-20 – 2017-08-23 (×4): 40 mg via ORAL
  Filled 2017-08-19 (×4): qty 2

## 2017-08-19 MED ORDER — ENSURE ENLIVE PO LIQD
237.0000 mL | Freq: Three times a day (TID) | ORAL | Status: DC
  Administered 2017-08-19 – 2017-08-23 (×10): 237 mL via ORAL

## 2017-08-19 MED ORDER — ENOXAPARIN SODIUM 40 MG/0.4ML ~~LOC~~ SOLN
40.0000 mg | SUBCUTANEOUS | Status: DC
  Administered 2017-08-19 – 2017-08-22 (×4): 40 mg via SUBCUTANEOUS
  Filled 2017-08-19 (×4): qty 0.4

## 2017-08-19 MED ORDER — ALBUTEROL SULFATE (2.5 MG/3ML) 0.083% IN NEBU
2.5000 mg | INHALATION_SOLUTION | RESPIRATORY_TRACT | Status: DC | PRN
  Administered 2017-08-22 (×2): 2.5 mg via RESPIRATORY_TRACT
  Filled 2017-08-19 (×2): qty 3

## 2017-08-19 MED ORDER — IPRATROPIUM-ALBUTEROL 0.5-2.5 (3) MG/3ML IN SOLN
3.0000 mL | Freq: Four times a day (QID) | RESPIRATORY_TRACT | Status: DC
  Administered 2017-08-19 – 2017-08-20 (×3): 3 mL via RESPIRATORY_TRACT
  Filled 2017-08-19: qty 3

## 2017-08-19 MED ORDER — IPRATROPIUM-ALBUTEROL 0.5-2.5 (3) MG/3ML IN SOLN
3.0000 mL | Freq: Once | RESPIRATORY_TRACT | Status: AC
  Administered 2017-08-19: 3 mL via RESPIRATORY_TRACT
  Filled 2017-08-19: qty 3

## 2017-08-19 MED ORDER — SODIUM CHLORIDE 0.9 % IV SOLN
INTRAVENOUS | Status: AC
  Administered 2017-08-19 – 2017-08-20 (×3): via INTRAVENOUS

## 2017-08-19 NOTE — ED Notes (Addendum)
Pt ambulated independently, oxygen initially 98% on 2L while ambulating 94% on 2L, pt is coughing a lot and feels increasingly short of breath and [place] titrated up to 4L. Pt resting in bed comfortably. Will attempt to walk in hall after cough medicine.

## 2017-08-19 NOTE — ED Provider Notes (Signed)
[facility] EMERGENCY DEPARTMENT Provider Note   CSN: [id number] Arrival date & time: 08/19/17  1045     History   Chief Complaint Chief Complaint  Patient presents with  . Emesis  . Cough    HPI Benny Deutschman Johnnathan Hagemeister. is a 37 y.o. male.  HPI  Salem Lembke Lavelle Akel. is a 37 y.o. male, with a history of COPD, alpha-1 antitrypsin deficiency, bipolar, Crohn's disease, asthma, and substance abuse, presenting to the ED with productive cough and shortness of breath worsening for the last 4 days.  Nausea, vomiting, diarrhea, and subjective fever beginning yesterday.  Endorses some chest soreness from coughing.  Has been using his albuterol breathing treatments about every 6 hours.  Patient states he uses home O2 of 2.5 L. Adds that they had a home situation that required him to be outside in the cold for a number of hours, after which his symptoms began.  No recent antibiotic use or hospitalizations. Denies abdominal pain, urinary complaints, rash, neck pain/stiffness, or any other complaints.   Past Medical History:  Diagnosis Date  . Alpha-1-antitrypsin deficiency ([place])   . Anxiety   . Asthma    mild   . Bipolar 1 disorder (HCC)    Self laceration and self harm behaviors   . COPD (chronic obstructive pulmonary disease) ([place])   . Crohn disease ([place]) Dx 2004   terminal ileum Crohn's s/p ileocolectomy with anastamosis  . Depression   . Kidney stones   . Substance abuse [facility])     Patient Active Problem List   Diagnosis Date Noted  . COPD with acute exacerbation ([place]) 08/19/2017  . Lobar pneumonia ([place]) 06/04/2017  . Sepsis, unspecified organism ([place]) 06/04/2017  . Respiratory failure with hypoxia ([place]) 06/04/2017  . Essential hypertension 06/04/2017  . Neuropathy 02/27/2017  . COPD with AB component  08/08/2016  . Alpha-1-antitrypsin deficiency ([place]) 08/03/2016  . Diarrhea   . Crohn's disease with complication ([place] [employer]) 55/73/2202  . Depression  02/27/2016  . Substance abuse ([place]) 02/27/2016  . Influenza with pneumonia 10/07/2014  . Protein-calorie malnutrition, severe ([place]) 10/06/2014  . Sepsis ([place]) 10/05/2014  . CAP (community acquired pneumonia) 10/05/2014  . Asthma 10/05/2014  . Generalized abdominal pain   . Hematemesis 09/04/2011  . ANEMIA 09/20/2009  . BIPOLAR DISORDER UNSPECIFIED 09/20/2009  . ADHD 09/20/2009  . Regional enteritis ([place]) 09/20/2009  . INTESTINAL OBSTRUCTION, HX OF 09/20/2009  . COCAINE ABUSE, HX OF 09/20/2009    Past Surgical History:  Procedure Laterality Date  . COLON SURGERY  01/17/2006  . ESOPHAGOGASTRODUODENOSCOPY  09/04/2011   Procedure: ESOPHAGOGASTRODUODENOSCOPY (EGD);  Surgeon: Estanislado Emms., MD,FACG;  Location: [facility];  Service: Endoscopy;  Laterality: N/A;  . INGUINAL HERNIA REPAIR     On the right and the left   . TONSILLECTOMY     20 yrs ago       Home Medications    Prior to Admission medications   Medication Sig Start Date End Date Taking? Authorizing Provider  albuterol (PROVENTIL) (2.5 MG/3ML) 0.083% nebulizer solution Take 3 mLs (2.5 mg total) by nebulization every 6 (six) hours as needed for wheezing or shortness of breath. 12/29/16  Yes Fransico Meadow, PA-C  Aspirin-Salicylamide-Caffeine (BC HEADACHE POWDER PO) Take 1 Package daily by mouth.   Yes [provider]  budesonide-formoterol (SYMBICORT) 160-4.5 MCG/ACT inhaler Inhale 2 puffs into the lungs 2 (two) times daily. 02/23/17  Yes Tanda Rockers, MD  clonazePAM (KLONOPIN) 0.5 MG tablet Take  0.5 mg 3 (three) times daily by mouth.    Yes [provider]  diltiazem (TIAZAC) 300 MG 24 hr capsule Take 300 mg by mouth daily.   Yes [provider]  gabapentin (NEURONTIN) 600 MG tablet Take 1 tablet (600 mg total) 3 (three) times daily by mouth. 06/08/17  Yes Lavina Hamman, MD  hydrOXYzine (ATARAX/VISTARIL) 25 MG tablet Take 1 tablet (25 mg total) by mouth every 6 (six) hours as needed for  anxiety. 03/02/17  Yes Cardama, Grayce Sessions, MD  ibuprofen (ADVIL,MOTRIN) 200 MG tablet Take 800 mg by mouth every 6 (six) hours as needed for moderate pain.   Yes [provider]  PROAIR HFA 108 (90 Base) MCG/ACT inhaler INHALE 2 PUFFS BY MOUTH EVERY 6 HOURS AS NEEDED FOR WHEEZING AND SHORTNESS OF BREATH 06/04/17  Yes Tanda Rockers, MD  promethazine (PHENERGAN) 25 MG tablet Take 25 mg daily as needed by mouth. 04/24/17  Yes [provider]  Tiotropium Bromide Monohydrate (SPIRIVA RESPIMAT) 2.5 MCG/ACT AERS Inhale 2 puffs into the lungs daily. 02/23/17  Yes Tanda Rockers, MD  acetaminophen-codeine (TYLENOL #3) 300-30 MG tablet Take 1 tablet every 8 (eight) hours as needed by mouth for severe pain. Patient not taking: Reported on 08/19/2017 06/08/17   Lavina Hamman, MD  benzonatate (TESSALON) 200 MG capsule Take 1 capsule (200 mg total) 3 (three) times daily by mouth. Patient not taking: Reported on 08/19/2017 06/08/17   Lavina Hamman, MD  feeding supplement, ENSURE ENLIVE, (ENSURE ENLIVE) LIQD Take 237 mLs 3 (three) times daily between meals by mouth. Patient not taking: Reported on 08/19/2017 06/08/17   Lavina Hamman, MD  guaiFENesin (MUCINEX) 600 MG 12 hr tablet Take 1 tablet (600 mg total) 2 (two) times daily by mouth. Patient not taking: Reported on 08/19/2017 06/08/17   Lavina Hamman, MD  levofloxacin [facility]) 750 MG tablet Take 1 tablet (750 mg total) daily by mouth. 06/09/17   Lavina Hamman, MD    Family History Family History  Problem Relation Age of Onset  . Hypertension Other   . Diabetes Other   . COPD Maternal Grandfather     Social History Social History   Tobacco Use  . Smoking status: Current Some Day Smoker    Packs/day: 0.50    Years: 25.00    Pack years: 12.50    Types: Cigarettes    Last attempt to quit: 01/27/2017    Years since quitting: 0.5  . Smokeless tobacco: Never Used  Substance Use Topics  . Alcohol use: Yes    Comment:  occsionally  . Drug use: Yes    Frequency: 2.0 times per week    Types: Marijuana, Heroin    Comment: occasionally.      Allergies   Penicillins; Red dye; Yellow dyes (non-tartrazine); and Diphenhydramine hcl   Review of Systems Review of Systems  Constitutional: Positive for fever.  Respiratory: Positive for cough and shortness of breath.   Cardiovascular: Negative for chest pain and leg swelling.  Gastrointestinal: Positive for diarrhea, nausea and vomiting. Negative for abdominal pain and blood in stool.  Musculoskeletal:       Chest soreness  Neurological: Negative for headaches.  All other systems reviewed and are negative.    Physical Exam Updated Vital Signs BP 105/77 (BP Location: Right Arm)   Pulse (!) 105   Temp 98.4 F (36.9 C) (Oral)   Resp 16   SpO2 97%   Physical Exam  Constitutional:  He appears well-developed and well-nourished. No distress.  HENT:  Head: Normocephalic and atraumatic.  Eyes: Conjunctivae are normal.  Neck: Neck supple.  Cardiovascular: Normal rate, regular rhythm, normal heart sounds and intact distal pulses.  Pulmonary/Chest: Tachypnea noted. He has decreased breath sounds in the right lower field and the left lower field. He has wheezes (global, inspiratory and expiratory).  Some increased work of breathing and conversational dyspnea.  Abdominal: Soft. There is no tenderness. There is no guarding.  Musculoskeletal: He exhibits no edema.  Lymphadenopathy:    He has no cervical adenopathy.  Neurological: He is alert.  Skin: Skin is warm and dry. He is not diaphoretic.  Psychiatric: He has a normal mood and affect. His behavior is normal.  Nursing note and vitals reviewed.    ED Treatments / Results  Labs (all labs ordered are listed, but only abnormal results are displayed) Labs Reviewed  COMPREHENSIVE METABOLIC PANEL - Abnormal; Notable for the following components:      Result Value   CO2 21 (*)    BUN 5 (*)    All other  components within normal limits  CBC WITH DIFFERENTIAL/PLATELET - Abnormal; Notable for the following components:   WBC 13.4 (*)    Neutro Abs 10.3 (*)    Monocytes Absolute 1.2 (*)    All other components within normal limits  CULTURE, BLOOD (ROUTINE X 2)  CULTURE, BLOOD (ROUTINE X 2)  INFLUENZA PANEL BY PCR (TYPE A & B)  URINALYSIS, ROUTINE W REFLEX MICROSCOPIC  I-STAT CG4 LACTIC ACID, ED    EKG  EKG Interpretation None       Radiology Dg Chest 2 View  Result Date: 08/19/2017 CLINICAL DATA:  Cough and emesis for 24 hours EXAM: CHEST  2 VIEW COMPARISON:  06/04/2017 FINDINGS: Cardiac shadow is within normal limits. The lungs are hyperinflated bilaterally. No focal infiltrate or sizable effusion is seen. No bony abnormality is noted. IMPRESSION: COPD without acute abnormality. Electronically Signed   By: Inez Catalina M.D.   On: 08/19/2017 11:33    Procedures Procedures (including critical care time)  Medications Ordered in ED Medications  ketorolac (TORADOL) 30 MG/ML injection 15 mg (15 mg Intravenous Given 08/19/17 1201)  sodium chloride 0.9 % bolus 1,000 mL (0 mLs Intravenous Stopped 08/19/17 1309)  ipratropium-albuterol (DUONEB) 0.5-2.5 (3) MG/3ML nebulizer solution 3 mL (3 mLs Nebulization Given 08/19/17 1201)  methylPREDNISolone sodium succinate (SOLU-MEDROL) 125 mg/2 mL injection 125 mg (125 mg Intravenous Given 08/19/17 1201)  chlorpheniramine-HYDROcodone (TUSSIONEX) 10-8 MG/5ML suspension 5 mL (5 mLs Oral Given 08/19/17 1318)     Initial Impression / Assessment and Plan / ED Course  I have reviewed the triage vital signs and the nursing notes.  Pertinent labs & imaging results that were available during my care of the patient were reviewed by me and considered in my medical decision making (see chart for details).  Clinical Course as of Aug 19 1402  Sun Aug 19, 2017  1240 Patient continues to endorse shortness of breath. Cough has increased following the DuoNeb.   Global inspiratory and expiratory wheezes, but less diminished.  States he has taken Tussionex without difficulty despite his allergy to yellow dyes.  [SJ]  [place] with Dr. Jamse Arn, hospitalist. Agrees to admit the patient.  [SJ]    Clinical Course User Index [SJ] Joy, Shawn C, PA-C    Patient presents with shortness of breath and cough.  Wheezing and diminished breath sounds on initial lung exam.  Increased supplemental O2  to treat patient's tachypnea and increased work of breathing.  Patient's SPO2 dropped to 92% during ambulation while on supplemental O2. Mild leukocytosis is noted, but patient is afebrile at presentation.  Chest x-ray negative for acute infiltrate.  Mild tachycardia resolved with IV fluids.  Negative influenza swab.  Abdomen soft and nontender. Admission for increased work of breathing, increased oxygen requirement, and drop in SPO2 with ambulation.  Findings and plan of care discussed with Theotis Burrow, MD. Dr. Rex Kras personally evaluated and examined this patient.  Vitals:   08/19/17 1230 08/19/17 1245 08/19/17 1300 08/19/17 1330  BP: (!) 134/96 122/80 123/79 110/82  Pulse: (!) 101 (!) 108 (!) 102 92  Resp: (!) 29 (!) 22 (!) 24 (!) 23  Temp:      TempSrc:      SpO2: 96% 95% 95% 96%     Final Clinical Impressions(s) / ED Diagnoses   Final diagnoses:  COPD exacerbation [facility])    ED Discharge Orders    None       Layla Maw 08/19/17 1405    Little, Wenda Overland, MD 08/20/17 5271    Rex Kras Wenda Overland, MD 08/27/17 [phone]

## 2017-08-19 NOTE — H&P (Signed)
History and Physical:    [name].   TMH:[id number] DOB: June 09, 1981 DOA: 08/19/2017  Referring MD/provider: PA Joy PCP: Charlott Rakes, MD   Patient coming from: Home  Chief Complaint: Shortness of breath  History of Present Illness:   [name]. is an 37 y.o. 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 Patient was in his usual state of poor health until 2 weeks prior to admission when he noted onset of wheezing and dyspnea on exertion. He self treated with his nebulizers and inhalers and thought he was getting better until 2 days ago when the police raided his apartment and he had to be outside in the cold for 2-3 hours while there is a stent down in the apartment. Patient states that since then he has had worsened symptoms and his cough which was improving is now reductive of thick green phlegm. Patient states that he coughed so much last night that he started dry heaving without actual vomitus.  Review of systems is positive for subjective fevers chills and sweats over the past 2 weeks which again was improving until 2 nights ago. It is also positive for new onset diarrhea which started last night and caused him to soil his bed. This is unusual for him. He states that he's had 4 episodes of diarrhea since then. Describes it as loose but not watery. Patient states this does not feel like his usual Crohn's flare as he usually has left lower quadrant tenderness with this and he does not feel at this time.  Patient's last significant drug use was of marijuana and tobacco on a daily basis. He last used cocaine more than 1 year ago and his last use of IV drugs,  heroin was more than 2 years ago. Patient adamantly denies any recent injection drug use.  ED Course:  The patient was noted to have significant cough and wheezing. He was treated with steroids and inhaled bronchodilators. He was noted to have worsened hypoxia on his usual 2 L of oxygen and significantly increased work of breathing with any attempts to ambulate.  ROS:   ROS   Review of Systems: General: Positive fever, chills,  Eyes: no discharge, redness, pain HENT: no ear pain, hearing loss, drainage, tinnitus Endocrine: no heat/cold intolerance, no polyuria Respiratory: Positive cough,, positive shortness of breath, negative hemoptysis Cardiovascular: No palpitations, chest pain GI: No nausea, no vomiting but yes to dry heaves, yes to diarrhea diarrhea, no constipation GU: No dysuria, increased frequency Blood/lymphatics: No easy bruising, bleeding Mood/affect: Patient has chronic anxiety/depression  which is at baseline   Past Medical History:   Past Medical History:  Diagnosis Date  . Alpha-1-antitrypsin deficiency ([place])   . Anxiety   . Asthma    mild   . Bipolar 1 disorder (HCC)    Self laceration and self harm behaviors   . COPD (chronic obstructive pulmonary disease) ([place])   . Crohn disease ([place]) Dx 2004   terminal ileum Crohn's s/p ileocolectomy with anastamosis  . Depression   . Kidney stones   . Substance abuse [facility])     Past Surgical History:   Past Surgical History:  Procedure Laterality Date  . COLON SURGERY  01/17/2006  .  ESOPHAGOGASTRODUODENOSCOPY  09/04/2011   Procedure: ESOPHAGOGASTRODUODENOSCOPY (EGD);  Surgeon: Estanislado Emms., MD,FACG;  Location: [facility];  Service: Endoscopy;  Laterality: N/A;  . INGUINAL HERNIA REPAIR     On the right and the left   . TONSILLECTOMY     20 yrs ago    Social History:   Social History   Socioeconomic History  . Marital status: Single    Spouse name: Not on file  . Number of children: Not on file  .  Years of education: Not on file  . Highest education level: Not on file  Social Needs  . Financial resource strain: Not on file  . Food insecurity - worry: Not on file  . Food insecurity - inability: Not on file  . Transportation needs - medical: Not on file  . Transportation needs - non-medical: Not on file  Occupational History  . Not on file  Tobacco Use  . Smoking status: Current Some Day Smoker    Packs/day: 0.50    Years: 25.00    Pack years: 12.50    Types: Cigarettes    Last attempt to quit: 01/27/2017    Years since quitting: 0.5  . Smokeless tobacco: Never Used  Substance and Sexual Activity  . Alcohol use: Yes    Comment: occsionally  . Drug use: Yes    Frequency: 2.0 times per week    Types: Marijuana, Heroin    Comment: occasionally.   . Sexual activity: Yes    Birth control/protection: None  Other Topics Concern  . Not on file  Social History Narrative   Lives at home with mother     Allergies   Penicillins; Red dye; Yellow dyes (non-tartrazine); and Diphenhydramine hcl  Family history:   Family History  Problem Relation Age of Onset  . Hypertension Other   . Diabetes Other   . COPD Maternal Grandfather     Current Medications:   Prior to Admission medications   Medication Sig Start Date End Date Taking? Authorizing Provider  albuterol (PROVENTIL) (2.5 MG/3ML) 0.083% nebulizer solution Take 3 mLs (2.5 mg total) by nebulization every 6 (six) hours as needed for wheezing or shortness of breath. 12/29/16  Yes Fransico Meadow, PA-C  Aspirin-Salicylamide-Caffeine (BC HEADACHE POWDER PO) Take 1 Package daily by mouth.   Yes [provider]  budesonide-formoterol (SYMBICORT) 160-4.5 MCG/ACT inhaler Inhale 2 puffs into the lungs 2 (two) times daily. 02/23/17  Yes Tanda Rockers, MD  clonazePAM (KLONOPIN) 0.5 MG tablet Take 0.5 mg 3 (three) times daily by mouth.    Yes [provider]  diltiazem (TIAZAC) 300 MG 24 hr capsule Take 300 mg by  mouth daily.   Yes [provider]  gabapentin (NEURONTIN) 600 MG tablet Take 1 tablet (600 mg total) 3 (three) times daily by mouth. 06/08/17  Yes Lavina Hamman, MD  hydrOXYzine (ATARAX/VISTARIL) 25 MG tablet Take 1 tablet (25 mg total) by mouth every 6 (six) hours as needed for anxiety. 03/02/17  Yes Cardama, Grayce Sessions, MD  ibuprofen (ADVIL,MOTRIN) 200 MG tablet Take 800 mg by mouth every 6 (six) hours as needed for moderate pain.   Yes [provider]  PROAIR HFA 108 (90 Base) MCG/ACT inhaler INHALE 2 PUFFS BY MOUTH EVERY 6 HOURS AS NEEDED FOR WHEEZING AND SHORTNESS OF BREATH 06/04/17  Yes Tanda Rockers, MD  promethazine (PHENERGAN) 25 MG tablet Take 25 mg daily as needed by mouth. 04/24/17  Yes [provider]  Tiotropium  Bromide Monohydrate (SPIRIVA RESPIMAT) 2.5 MCG/ACT AERS Inhale 2 puffs into the lungs daily. 02/23/17  Yes Tanda Rockers, MD  acetaminophen-codeine (TYLENOL #3) 300-30 MG tablet Take 1 tablet every 8 (eight) hours as needed by mouth for severe pain. Patient not taking: Reported on 08/19/2017 06/08/17   Lavina Hamman, MD  benzonatate (TESSALON) 200 MG capsule Take 1 capsule (200 mg total) 3 (three) times daily by mouth. Patient not taking: Reported on 08/19/2017 06/08/17   Lavina Hamman, MD  feeding supplement, ENSURE ENLIVE, (ENSURE ENLIVE) LIQD Take 237 mLs 3 (three) times daily between meals by mouth. Patient not taking: Reported on 08/19/2017 06/08/17   Lavina Hamman, MD  guaiFENesin (MUCINEX) 600 MG 12 hr tablet Take 1 tablet (600 mg total) 2 (two) times daily by mouth. Patient not taking: Reported on 08/19/2017 06/08/17   Lavina Hamman, MD  levofloxacin [facility]) 750 MG tablet Take 1 tablet (750 mg total) daily by mouth. 06/09/17   Lavina Hamman, MD    Physical Exam:   Vitals:   08/19/17 1230 08/19/17 1245 08/19/17 1300 08/19/17 1330  BP: (!) 134/96 122/80 123/79 110/82  Pulse: (!) 101 (!) 108 (!) 102 92  Resp: (!) 29 (!) 22  (!) 24 (!) 23  Temp:      TempSrc:      SpO2: 96% 95% 95% 96%     Physical Exam: Blood pressure 110/82, pulse 92, temperature 98.4 F (36.9 C), temperature source Oral, resp. rate (!) 23, SpO2 96 %. Gen: Thin, chronically ill-appearing man looking younger than stated age with bitemporal wasting sitting up in bed speaking in full sentences without respiratory difficulty but appears to be very weak and tired Eyes: Sclerae anicteric. Conjunctiva mildly injected. Neck: Supple, no jugular venous distention. Chest: Reasonable air entry bilaterally faint expiratory wheezes on the left but persistent inspiratory and expiratory wheezing at right base. I do not hear any rales or rhonchi. E to A changes were negative.  CV: Distant, regular, no audible murmurs. Abdomen: Thin, scaphoid NABS, soft, nondistended, minimal tenderness to palpation in bilateral lower quadrants. No masses. No rebound, no guarding. Extremities: No edema.  Neuro: Alert and oriented times 3; grossly nonfocal. Psych: Patient appears anxious but is cooperative. His mother is at bedside participating eagerly in history and having a hard time letting her son answer questions for himself.  Data Review:    Labs: Basic Metabolic Panel: Recent Labs  Lab 08/19/17 1058  NA 138  K 3.9  CL 106  CO2 21*  GLUCOSE 88  BUN 5*  CREATININE 0.69  CALCIUM 9.1   Liver Function Tests: Recent Labs  Lab 08/19/17 1058  AST 39  ALT 52  ALKPHOS 98  BILITOT 0.7  PROT 7.0  ALBUMIN 3.8   No results for input(s): LIPASE, AMYLASE in the last 168 hours. No results for input(s): AMMONIA in the last 168 hours. CBC: Recent Labs  Lab 08/19/17 1058  WBC 13.4*  NEUTROABS 10.3*  HGB 15.3  HCT 45.5  MCV 96.6  PLT 217   Cardiac Enzymes: No results for input(s): CKTOTAL, CKMB, CKMBINDEX, TROPONINI in the last 168 hours.  BNP (last 3 results) No results for input(s): PROBNP in the last 8760 hours. CBG: No results for input(s):  GLUCAP in the last 168 hours.  Urinalysis    Component Value Date/Time   COLORURINE YELLOW 06/05/2017 [place] 06/05/2017 0235   LABSPEC 1.011 06/05/2017 0235   PHURINE 6.0 06/05/2017 0235  GLUCOSEU 50 (A) 06/05/2017 0235   HGBUR NEGATIVE 06/05/2017 0235   BILIRUBINUR NEGATIVE 06/05/2017 0235   KETONESUR NEGATIVE 06/05/2017 0235   PROTEINUR NEGATIVE 06/05/2017 0235   UROBILINOGEN 1.0 10/05/2014 1501   NITRITE NEGATIVE 06/05/2017 0235   LEUKOCYTESUR NEGATIVE 06/05/2017 0235      Radiographic Studies: Dg Chest 2 View  Result Date: 08/19/2017 CLINICAL DATA:  Cough and emesis for 24 hours EXAM: CHEST  2 VIEW COMPARISON:  06/04/2017 FINDINGS: Cardiac shadow is within normal limits. The lungs are hyperinflated bilaterally. No focal infiltrate or sizable effusion is seen. No bony abnormality is noted. IMPRESSION: COPD without acute abnormality. Electronically Signed   By: Inez Catalina M.D.   On: 08/19/2017 11:33    EKG: Independently reviewed. Sinus rhythm at 100. P pulmonale. Right axis deviation. Q V1 and V2. No acute ST-T wave changes   Assessment/Plan:   Principal Problem:   COPD with acute exacerbation (HCC) Active Problems:   BIPOLAR DISORDER UNSPECIFIED   Regional enteritis (HCC)   Asthma   Substance abuse ([place])   Essential hypertension   COPD/ASTHMA WITH ALPHA 1 ANTITRYPSIN DEFICIENCY Patient with oxygen dependent obstructive lung disease secondary to alpha 1 antitrypsin deficiency and ongoing tobacco and marijuana use Now and flare with increased oxygen demand Chest x-ray is negative although he does have focal area of increased wheezing at right base without any clinical evidence of consolidation Will treat with steroids and inhaled bronchodilators Given prolonged course of shortness of breath with increased cough with new increased green phlegm, will start Levaquin. Repeat chest x-ray tomorrow to see if there is a new process at right  base. Tussionex for cough per patient request, of note patient is known to be addicted to opioids so would stop Tussionex when coughing under control.  LEUKOCYTOSIS  Patient with new leukocytosis of unclear etiology He was recently in house with sepsis secondary to pneumonia in November 2018 As noted above chest x-ray is negative Flu PCA is negative Patient does have diarrhea which could be a source versus pneumonia that is not yet radiologically evident. I am starting Levaquin which should treat both pneumonia and or diarrhea if they are source of infection.  DIARRHEA Unclear if this is early crohns flare or not If this is a case of a Crohn's flare, the prednisone will be effective If it is bacterial, levaquin should be effective   CROHNS S/P ileocolectomy and anastamosis 2007 Notes he has only had 2 flares since surgery, last > 2 years ago Not sure if diarrhea is secondary to crohns, is not having his usual LLQ tenderness but will need to follow   SUBSTANCE ABUSE Continues to smoke 10 tobacco cigarretts/day Smokes 1 joint every other day  Last Cocaine use was 1 year ago Last IVDU was 18 months ago  UDS ordered  Would stop Tussionex as soon as clinically tolerated given history of opioid dependence in past.  PSYCH Unclear diagnosis: bipolar vs anxiety vs unipolar depression  Will continue clonazepam per home doses  Further management as outpatient   HTN Continue diltiazem   NEUROPATHY Continue gabapentin  Other information:   DVT prophylaxis: Lovenox ordered. Code Status: Full code. Family Communication: Patient's mother was at bedside and fully engaged with his healthcare  Disposition Plan: Home Consults called: None Admission status: Inpatient   The medical decision making on this patient was of high complexity and the patient is at high risk for clinical deterioration, therefore this is a level 3 visit.   [name]  [name] [facility] Pager  [phone] Cell: [phone]   If 7PM-7AM, please contact night-coverage [email or web address] Password [email or web address] 08/19/2017, 2:02 PM

## 2017-08-19 NOTE — ED Notes (Signed)
Pt ambulated in hallway, oxygen levels 92% on 2L [place] and patient states he feels drained while ambulating.

## 2017-08-19 NOTE — ED Triage Notes (Signed)
Pt presents for evaluation of emesis and cough x 24 hours. Pt reports d/c in November for PNA. On O2 at home.

## 2017-08-19 NOTE — Progress Notes (Signed)
1526 Received pt from ED. Patient alert oriented and resting bed. Pt accompanied by mother.

## 2017-08-20 ENCOUNTER — Inpatient Hospital Stay (HOSPITAL_COMMUNITY): Payer: Medicaid Other

## 2017-08-20 ENCOUNTER — Other Ambulatory Visit: Payer: Self-pay

## 2017-08-20 LAB — BASIC METABOLIC PANEL
ANION GAP: 7 (ref 5–15)
BUN: 6 mg/dL (ref 6–20)
CO2: 24 mmol/L (ref 22–32)
Calcium: 8.4 mg/dL — ABNORMAL LOW (ref 8.9–10.3)
Chloride: 107 mmol/L (ref 101–111)
Creatinine, Ser: 0.6 mg/dL — ABNORMAL LOW (ref 0.61–1.24)
GFR calc Af Amer: >60 mL/min (ref >60)
GLUCOSE: 122 mg/dL — AB (ref 65–99)
POTASSIUM: 4.1 mmol/L (ref 3.5–5.1)
Sodium: 138 mmol/L (ref 135–145)

## 2017-08-20 LAB — CBC
HEMATOCRIT: 36.2 % — AB (ref 39.0–52.0)
HEMOGLOBIN: 12.2 g/dL — AB (ref 13.0–17.0)
MCH: 32.9 pg (ref 26.0–34.0)
MCHC: 33.7 g/dL (ref 30.0–36.0)
MCV: 97.6 fL (ref 78.0–100.0)
Platelets: 190 10*3/uL (ref 150–400)
RBC: 3.71 MIL/uL — ABNORMAL LOW (ref 4.22–5.81)
RDW: 13.1 % (ref 11.5–15.5)
WBC: 7.7 10*3/uL (ref 4.0–10.5)

## 2017-08-20 MED ORDER — DM-GUAIFENESIN ER 30-600 MG PO TB12
2.0000 | ORAL_TABLET | Freq: Once | ORAL | Status: AC
  Administered 2017-08-20: 2 via ORAL
  Filled 2017-08-20: qty 2

## 2017-08-20 MED ORDER — DOXYCYCLINE HYCLATE 100 MG IV SOLR
100.0000 mg | Freq: Two times a day (BID) | INTRAVENOUS | Status: DC
  Administered 2017-08-20 – 2017-08-21 (×2): 100 mg via INTRAVENOUS
  Filled 2017-08-20 (×3): qty 100

## 2017-08-20 MED ORDER — MOMETASONE FURO-FORMOTEROL FUM 200-5 MCG/ACT IN AERO
2.0000 | INHALATION_SPRAY | Freq: Two times a day (BID) | RESPIRATORY_TRACT | Status: DC
  Administered 2017-08-20 – 2017-08-23 (×7): 2 via RESPIRATORY_TRACT
  Filled 2017-08-20: qty 8.8

## 2017-08-20 MED ORDER — IPRATROPIUM-ALBUTEROL 0.5-2.5 (3) MG/3ML IN SOLN
3.0000 mL | Freq: Three times a day (TID) | RESPIRATORY_TRACT | Status: DC
Start: 2017-08-20 — End: 2017-08-23
  Administered 2017-08-20 – 2017-08-23 (×9): 3 mL via RESPIRATORY_TRACT
  Filled 2017-08-20 (×9): qty 3

## 2017-08-20 MED ORDER — KETOROLAC TROMETHAMINE 30 MG/ML IJ SOLN
30.0000 mg | Freq: Once | INTRAMUSCULAR | Status: AC
  Administered 2017-08-20: 30 mg via INTRAVENOUS
  Filled 2017-08-20: qty 1

## 2017-08-20 MED ORDER — DM-GUAIFENESIN ER 30-600 MG PO TB12
2.0000 | ORAL_TABLET | Freq: Two times a day (BID) | ORAL | Status: DC | PRN
  Administered 2017-08-20 – 2017-08-23 (×6): 2 via ORAL
  Filled 2017-08-20 (×6): qty 2

## 2017-08-20 NOTE — Progress Notes (Addendum)
Nursing Note: Patient and Mother requested to speak with leadership.  Will address issues and follow up with appropriate personnel.

## 2017-08-20 NOTE — Progress Notes (Signed)
[facility] PROGRESS NOTE    Progress Note  [name].  IRJ:[id number] DOB: February 05, 1981 DOA: 08/19/2017 PCP: Charlott Rakes, MD     Brief Narrative:   Clifford Hawk Chanze Teagle. is an 37 y.o. 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  Oxygen at home, Crohn's disease, with ongoing substance abuse, hepatitis C, with poor functional status comes in for shortness of breath, the started 2 weeks prior to admission with wheezing, dyspnea on exertion and cough.  He self treated himself at home  Assessment/Plan:   COPD with acute exacerbation (HCC)/alpha-1 antitrypsin deficiency/leukocytosis: Continue oxygen therapy. Patient is wheezing loudly, with labored breathing but able to speak in full sentences.  He does have a focal area on the right lung base which has prominent wheezing will repeat chest x-ray Continue oral steroids empiric antibiotics and inhalers. Ambulate tomorrow morning on home oxygen and document saturations. Influenza PCR negative.  Diarrhea: Now resolved  Crohn's colitis Status post ileocolectomy and anastomosis in 2007. Denies any abdominal pain or tenderness.  Has not had a flare in the last 2 years.  Substance abuse: He is asking for additional narcotics, has a history of IVDU 18 months ago, and cocaine a year ago.  Continues to smoke every day. Stop Tussionex start Robitussin-DM twice a day as needed. UDS is negative. He has a follow-up with the pain clinic next month.  BIPOLAR DISORDER UNSPECIFIED Continue Klonopin per home dose we will not increase.  Essential hypertension: Continue diltiazem.  Neuropathy: Continue gabapentin.  DVT prophylaxis: lovenox Family Communication:none Disposition Plan/Barrier to D/C: home in 2 days Code Status:     Code Status Orders  (From admission, onward)        Start     Ordered   08/19/17 1516  Full code  Continuous     08/19/17 1522    Code  Status History    Date Active Date Inactive Code Status Order ID Comments User Context   06/04/2017 13:44 06/08/2017 18:09 Full Code [id number]  Waldemar Dickens, MD ED   02/27/2016 21:39 02/29/2016 18:33 Full Code [id number]  Juliet Rude, MD Inpatient   10/05/2014 23:48 10/07/2014 16:57 Full Code [id number]  Toy Baker, MD Inpatient   09/05/2011 03:03 09/06/2011 19:43 Full Code [id number]  Quintella Baton, MD ED   09/04/2011 04:02 09/04/2011 21:29 Full Code [id number]  Carilyn Goodpasture, RN Inpatient        IV Access:    Peripheral IV   Procedures and diagnostic studies:   Dg Chest 2 View  Result Date: 08/19/2017 CLINICAL DATA:  Cough and emesis for 24 hours EXAM: CHEST  2 VIEW COMPARISON:  06/04/2017 FINDINGS: Cardiac shadow is within normal limits. The lungs are hyperinflated bilaterally. No focal infiltrate or sizable effusion is seen. No bony abnormality is noted. IMPRESSION: COPD without acute abnormality. Electronically Signed   By: Inez Catalina M.D.   On: 08/19/2017 11:33     Medical Consultants:    None.  Anti-Infectives:   Levaquin  Subjective:    Clifford Sexton. he relates his cough is mildly improved he is complaining of diffuse chest tightness.  Objective:    Vitals:   08/19/17 2154 08/20/17 0139 08/20/17 0524 08/20/17 0605  BP: 108/65  110/80   Pulse: 73  78   Resp: 16  16   Temp: 97.7 F (36.5 C)  97.7 F (36.5 C)   TempSrc: Oral  Oral   SpO2: 98% 99% 96%  Weight:    45.2 kg (99 lb 10.4 oz)  Height:    5' 5"  (1.651 m)    Intake/Output Summary (Last 24 hours) at 08/20/2017 0751 Last data filed at 08/20/2017 0600 Gross per 24 hour  Intake 2296.25 ml  Output 1350 ml  Net 946.25 ml   Filed Weights   08/20/17 0605  Weight: 45.2 kg (99 lb 10.4 oz)    Exam: General exam: In no acute distress. Respiratory system: Good air movement with wheezing bilaterally but more accentuated on the right lower lobe. Cardiovascular system: S1 & S2  heard, RRR.  Gastrointestinal system: Abdomen is nondistended, soft and nontender.  Central nervous system: Alert and oriented. No focal neurological deficits. Extremities: No pedal edema. Skin: No rashes, lesions or ulcers Psychiatry: Judgement and insight appear normal. Mood & affect appropriate.    Data Reviewed:    Labs: Basic Metabolic Panel: Recent Labs  Lab 08/19/17 1058 08/19/17 1524  NA 138  --   K 3.9  --   CL 106  --   CO2 21*  --   GLUCOSE 88  --   BUN 5*  --   CREATININE 0.69 0.64  CALCIUM 9.1  --    GFR Estimated Creatinine Clearance: 80.8 mL/min (by C-G formula based on SCr of 0.64 mg/dL). Liver Function Tests: Recent Labs  Lab 08/19/17 1058  AST 39  ALT 52  ALKPHOS 98  BILITOT 0.7  PROT 7.0  ALBUMIN 3.8   No results for input(s): LIPASE, AMYLASE in the last 168 hours. No results for input(s): AMMONIA in the last 168 hours. Coagulation profile No results for input(s): INR, PROTIME in the last 168 hours.  CBC: Recent Labs  Lab 08/19/17 1058 08/19/17 1524 08/20/17 0708  WBC 13.4* 9.2 7.7  NEUTROABS 10.3*  --   --   HGB 15.3 14.8 12.2*  HCT 45.5 43.7 36.2*  MCV 96.6 97.3 97.6  PLT 217 204 190   Cardiac Enzymes: No results for input(s): CKTOTAL, CKMB, CKMBINDEX, TROPONINI in the last 168 hours. BNP (last 3 results) No results for input(s): PROBNP in the last 8760 hours. CBG: No results for input(s): GLUCAP in the last 168 hours. D-Dimer: No results for input(s): DDIMER in the last 72 hours. Hgb A1c: No results for input(s): HGBA1C in the last 72 hours. Lipid Profile: No results for input(s): CHOL, HDL, LDLCALC, TRIG, CHOLHDL, LDLDIRECT in the last 72 hours. Thyroid function studies: No results for input(s): TSH, T4TOTAL, T3FREE, THYROIDAB in the last 72 hours.  Invalid input(s): FREET3 Anemia work up: No results for input(s): VITAMINB12, FOLATE, FERRITIN, TIBC, IRON, RETICCTPCT in the last 72 hours. Sepsis Labs: Recent Labs  Lab  08/19/17 1058 08/19/17 1117 08/19/17 1524 08/20/17 0708  WBC 13.4*  --  9.2 7.7  LATICACIDVEN  --  1.08  --   --    Microbiology No results found for this or any previous visit (from the past 240 hour(s)).   Medications:   . clonazePAM  0.5 mg Oral TID  . diltiazem  300 mg Oral Daily  . enoxaparin (LOVENOX) injection  40 mg Subcutaneous Q24H  . feeding supplement (ENSURE ENLIVE)  237 mL Oral TID BM  . gabapentin  600 mg Oral TID  . ipratropium-albuterol  3 mL Nebulization TID  . levofloxacin  750 mg Oral Daily  . predniSONE  40 mg Oral Q breakfast   Continuous Infusions: . sodium chloride 125 mL/hr at 08/19/17 2322      LOS: 1  day   [name]  [facility] Pager [phone]  *Please refer to [email or web address], password [email or web address] to get updated schedule on who will round on this patient, as hospitalists switch teams weekly. If 7PM-7AM, please contact night-coverage at [email or web address], password [email or web address] for any overnight needs.  08/20/2017, 7:51 AM

## 2017-08-21 DIAGNOSIS — F310 Bipolar disorder, current episode hypomanic: Secondary | ICD-10-CM

## 2017-08-21 DIAGNOSIS — J4520 Mild intermittent asthma, uncomplicated: Secondary | ICD-10-CM

## 2017-08-21 LAB — HIV ANTIBODY (ROUTINE TESTING W REFLEX): HIV SCREEN 4TH GENERATION: NONREACTIVE

## 2017-08-21 MED ORDER — HYDROCODONE-HOMATROPINE 5-1.5 MG/5ML PO SYRP
5.0000 mL | ORAL_SOLUTION | Freq: Four times a day (QID) | ORAL | Status: DC | PRN
  Administered 2017-08-21 – 2017-08-23 (×8): 5 mL via ORAL
  Filled 2017-08-21 (×8): qty 5

## 2017-08-21 MED ORDER — PREDNISONE 20 MG PO TABS: 40.0000 mg | ORAL_TABLET | Freq: Every day | ORAL | 0 refills | Status: DC

## 2017-08-21 MED ORDER — LEVOFLOXACIN 750 MG PO TABS
750.0000 mg | ORAL_TABLET | Freq: Every day | ORAL | Status: DC
  Administered 2017-08-21 – 2017-08-23 (×3): 750 mg via ORAL
  Filled 2017-08-21 (×3): qty 1

## 2017-08-21 MED ORDER — IPRATROPIUM-ALBUTEROL 0.5-2.5 (3) MG/3ML IN SOLN: 3.0000 mL | Freq: Three times a day (TID) | RESPIRATORY_TRACT | 1 refills | Status: DC

## 2017-08-21 MED ORDER — LEVOFLOXACIN 750 MG PO TABS: 750.0000 mg | ORAL_TABLET | Freq: Every day | ORAL | 0 refills | Status: DC

## 2017-08-21 MED ORDER — ALBUTEROL SULFATE (2.5 MG/3ML) 0.083% IN NEBU: 2.5000 mg | INHALATION_SOLUTION | Freq: Four times a day (QID) | RESPIRATORY_TRACT | 12 refills | Status: AC | PRN

## 2017-08-21 MED ORDER — DM-GUAIFENESIN ER 30-600 MG PO TB12: 2.0000 | ORAL_TABLET | Freq: Two times a day (BID) | ORAL | 0 refills | Status: AC | PRN

## 2017-08-21 MED ORDER — HYDROCODONE-HOMATROPINE 5-1.5 MG/5ML PO SYRP: 5.0000 mL | ORAL_SOLUTION | Freq: Four times a day (QID) | ORAL | 0 refills | Status: DC | PRN

## 2017-08-21 MED ORDER — TIOTROPIUM BROMIDE MONOHYDRATE 2.5 MCG/ACT IN AERS: 2.0000 | INHALATION_SPRAY | Freq: Every day | RESPIRATORY_TRACT | 11 refills | Status: DC

## 2017-08-21 MED ORDER — BUDESONIDE-FORMOTEROL FUMARATE 160-4.5 MCG/ACT IN AERO: 2.0000 | INHALATION_SPRAY | Freq: Two times a day (BID) | RESPIRATORY_TRACT | 11 refills | Status: AC

## 2017-08-21 NOTE — Discharge Summary (Signed)
Physician Discharge Summary  [name]. MRN: [id number] DOB/AGE: [birth year] 37 y.o.  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 Pennie Banter       Allergies as of 08/21/2017      Reactions   Penicillins Anaphylaxis, Swelling   Has patient had a PCN reaction causing immediate rash, facial/tongue/throat swelling, SOB or lightheadedness with hypotension: Yes Has patient had a PCN reaction causing severe rash involving mucus membranes or skin necrosis: No Has patient had a PCN reaction that required hospitalization No Has patient had a PCN reaction occurring within the last 10 years: No If all of the above answers are "NO", then may proceed with Cephalosporin use.   Yellow Dyes (non-tartrazine) Swelling   Pt states this reaction was as a child and he has not had this reaction to anything as an adult   Diphenhydramine Hcl Hives, Rash   Red Dye Swelling   Pt states this reaction was as a child and he has not had this reaction to anything as an adult      Medication List    STOP taking these medications   ibuprofen 200 MG tablet Commonly known as:  ADVIL,MOTRIN     TAKE these medications   acetaminophen-codeine 300-30 MG tablet Commonly known as:  TYLENOL #3 Take 1 tablet every 8 (eight) hours as needed by mouth for severe pain.   BC HEADACHE POWDER PO Take 1 Package daily by mouth.   benzonatate 200 MG capsule Commonly known as:  TESSALON Take 1 capsule (200 mg total) 3 (three) times  daily by mouth.   budesonide-formoterol 160-4.5 MCG/ACT inhaler Commonly known as:  SYMBICORT Inhale 2 puffs into the lungs 2 (two) times daily.   clonazePAM 0.5 MG tablet Commonly known as:  KLONOPIN Take 0.5 mg 3 (three) times daily by mouth.   dextromethorphan-guaiFENesin 30-600 MG 12hr tablet Commonly known as:  MUCINEX DM Take 2 tablets by mouth 2 (two) times daily as needed for cough.   diltiazem 300 MG 24 hr capsule Commonly known as:  TIAZAC Take 300 mg by mouth daily.   feeding supplement (ENSURE ENLIVE) Liqd Take 237 mLs 3 (three) times daily between meals by mouth.   gabapentin 600 MG tablet Commonly known as:  NEURONTIN Take 1 tablet (600 mg total) 3 (three) times daily by mouth.   guaiFENesin 600 MG 12 hr tablet Commonly known as:  MUCINEX Take 1 tablet (600 mg total) 2 (two) times daily by mouth.   hydrOXYzine 25 MG tablet Commonly known as:  ATARAX/VISTARIL Take 1 tablet (25 mg total) by mouth every 6 (six) hours as needed for anxiety.   ipratropium-albuterol 0.5-2.5 (3) MG/3ML Soln Commonly known as:  DUONEB Take 3 mLs by nebulization 3 (three) times daily.   levofloxacin 750 MG tablet Commonly known as:  LEVAQUIN Take 1 tablet (750 mg total) by mouth daily for 5 days.   predniSONE 20 MG tablet Commonly known as:  DELTASONE Take 2 tablets (40 mg total) by mouth daily with breakfast for 5  days. Start taking on:  08/22/2017   PROAIR HFA 108 (90 Base) MCG/ACT inhaler Generic drug:  albuterol INHALE 2 PUFFS BY MOUTH EVERY 6 HOURS AS NEEDED FOR WHEEZING AND SHORTNESS OF BREATH   albuterol (2.5 MG/3ML) 0.083% nebulizer solution Commonly known as:  PROVENTIL Take 3 mLs (2.5 mg total) by nebulization every 6 (six) hours as needed for wheezing or shortness of breath.   promethazine 25 MG tablet Commonly known as:  PHENERGAN Take 25 mg daily as needed by mouth.   Tiotropium Bromide Monohydrate 2.5 MCG/ACT Aers Commonly known as:  SPIRIVA  RESPIMAT Inhale 2 puffs into the lungs daily.        Discharge Condition: * STABLE   Discharge Instructions Get Medicines reviewed and adjusted: Please take all your medications with you for your next visit with your Primary MD  Please request your Primary MD to go over all hospital tests and procedure/radiological results at the follow up, please ask your Primary MD to get all Hospital records sent to his/her office.  If you experience worsening of your admission symptoms, develop shortness of breath, life threatening emergency, suicidal or homicidal thoughts you must seek medical attention immediately by calling 911 or calling your MD immediately if symptoms less severe.  You must read complete instructions/literature along with all the possible adverse reactions/side effects for all the Medicines you take and that have been prescribed to you. Take any new Medicines after you have completely understood and accpet all the possible adverse reactions/side effects.   Do not drive when taking Pain medications.   Do not take more than prescribed Pain, Sleep and Anxiety Medications  Special Instructions: If you have smoked or chewed Tobacco in the last 2 yrs please stop smoking, stop any regular Alcohol and or any Recreational drug use.  Wear Seat belts while driving.  Please note  You were cared for by a hospitalist during your hospital stay. Once you are discharged, your primary care physician will handle any further medical issues. Please note that NO REFILLS for any discharge medications will be authorized once you are discharged, as it is imperative that you return to your primary care physician (or establish a relationship with a primary care physician if you do not have one) for your aftercare needs so that they can reassess your need for medications and monitor your lab values.     Allergies  Allergen Reactions  . Penicillins Anaphylaxis and Swelling    Has patient had a  PCN reaction causing immediate rash, facial/tongue/throat swelling, SOB or lightheadedness with hypotension: Yes Has patient had a PCN reaction causing severe rash involving mucus membranes or skin necrosis: No Has patient had a PCN reaction that required hospitalization No Has patient had a PCN reaction occurring within the last 10 years: No If all of the above answers are "NO", then may proceed with Cephalosporin use.   . Yellow Dyes (Non-Tartrazine) Swelling    Pt states this reaction was as a child and he has not had this reaction to anything as an adult  . Diphenhydramine Hcl Hives and Rash  . Red Dye Swelling    Pt states this reaction was as a child and he has not had this reaction to anything as an adult      Disposition: 01-Home or Self Care   Consults: *  None    Significant Diagnostic Studies:  X-ray Chest Pa And Lateral  Result Date: 08/20/2017 CLINICAL DATA:  Shortness of breath. EXAM: CHEST  2  VIEW COMPARISON:  Radiographs of August 19, 2017. FINDINGS: The heart size and mediastinal contours are within normal limits. Both lungs are clear. No pneumothorax or pleural effusion is noted. Hyperexpansion of the lungs is noted. The visualized skeletal structures are unremarkable. IMPRESSION: No active cardiopulmonary disease. Hyperexpansion of the lungs suggesting chronic obstructive pulmonary disease. Electronically Signed   By: Marijo Conception, M.D.   On: 08/20/2017 09:23   Dg Chest 2 View  Result Date: 08/19/2017 CLINICAL DATA:  Cough and emesis for 24 hours EXAM: CHEST  2 VIEW COMPARISON:  06/04/2017 FINDINGS: Cardiac shadow is within normal limits. The lungs are hyperinflated bilaterally. No focal infiltrate or sizable effusion is seen. No bony abnormality is noted. IMPRESSION: COPD without acute abnormality. Electronically Signed   By: Inez Catalina M.D.   On: 08/19/2017 11:33        Filed Weights   08/20/17 0605  Weight: 45.2 kg (99 lb 10.4 oz)      Microbiology: Recent Results (from the past 240 hour(s))  Culture, blood (routine x 2)     Status: None (Preliminary result)   Collection Time: 08/19/17  3:22 PM  Result Value Ref Range Status   Specimen Description BLOOD RIGHT ANTECUBITAL  Final   Special Requests   Final    BOTTLES DRAWN AEROBIC AND ANAEROBIC Blood Culture adequate volume   Culture NO GROWTH 2 DAYS  Final   Report Status PENDING  Incomplete  Culture, blood (routine x 2)     Status: None (Preliminary result)   Collection Time: 08/19/17  3:25 PM  Result Value Ref Range Status   Specimen Description BLOOD LEFT ANTECUBITAL  Final   Special Requests   Final    BOTTLES DRAWN AEROBIC AND ANAEROBIC Blood Culture adequate volume   Culture NO GROWTH 2 DAYS  Final   Report Status PENDING  Incomplete       Blood Culture    Component Value Date/Time   SDES BLOOD LEFT ANTECUBITAL 08/19/2017 1525   SPECREQUEST  08/19/2017 1525    BOTTLES DRAWN AEROBIC AND ANAEROBIC Blood Culture adequate volume   CULT NO GROWTH 2 DAYS 08/19/2017 1525   REPTSTATUS PENDING 08/19/2017 1525      Labs: Results for orders placed or performed during the hospital encounter of 08/19/17 (from the past 48 hour(s))  I-Stat CG4 Lactic Acid, ED     Status: None   Collection Time: 08/19/17 11:17 AM  Result Value Ref Range   Lactic Acid, Venous 1.08 0.5 - 1.9 mmol/L  Influenza panel by PCR (type A & B)     Status: None   Collection Time: 08/19/17 12:08 PM  Result Value Ref Range   Influenza A By PCR NEGATIVE NEGATIVE   Influenza B By PCR NEGATIVE NEGATIVE    Comment: (NOTE) The Xpert Xpress Flu assay is intended as an aid in the diagnosis of  influenza and should not be used as a sole basis for treatment.  This  assay is FDA approved for nasopharyngeal swab specimens only. Nasal  washings and aspirates are unacceptable for Xpert Xpress Flu testing.   Culture, blood (routine x 2)     Status: None (Preliminary result)   Collection Time:  08/19/17  3:22 PM  Result Value Ref Range   Specimen Description BLOOD RIGHT ANTECUBITAL    Special Requests      BOTTLES DRAWN AEROBIC AND ANAEROBIC Blood Culture adequate volume   Culture NO GROWTH 2 DAYS    Report Status PENDING  HIV antibody (Routine Testing)     Status: None   Collection Time: 08/19/17  3:24 PM  Result Value Ref Range   HIV Screen 4th Generation wRfx Non Reactive Non Reactive    Comment: (NOTE) Performed At: [facility] [place], [place] [id number] Rush Farmer MD YO:[id number]   CBC     Status: None   Collection Time: 08/19/17  3:24 PM  Result Value Ref Range   WBC 9.2 4.0 - 10.5 K/uL   RBC 4.49 4.22 - 5.81 MIL/uL   Hemoglobin 14.8 13.0 - 17.0 g/dL   HCT 43.7 39.0 - 52.0 %   MCV 97.3 78.0 - 100.0 fL   MCH 33.0 26.0 - 34.0 pg   MCHC 33.9 30.0 - 36.0 g/dL   RDW 13.0 11.5 - 15.5 %   Platelets 204 150 - 400 K/uL  Creatinine, serum     Status: None   Collection Time: 08/19/17  3:24 PM  Result Value Ref Range   Creatinine, Ser 0.64 0.61 - 1.24 mg/dL   GFR calc non Af Amer >60 >60 mL/min   GFR calc Af Amer >60 >60 mL/min    Comment: (NOTE) The eGFR has been calculated using the CKD EPI equation. This calculation has not been validated in all clinical situations. eGFR's persistently <60 mL/min signify possible Chronic Kidney Disease.   Culture, blood (routine x 2)     Status: None (Preliminary result)   Collection Time: 08/19/17  3:25 PM  Result Value Ref Range   Specimen Description BLOOD LEFT ANTECUBITAL    Special Requests      BOTTLES DRAWN AEROBIC AND ANAEROBIC Blood Culture adequate volume   Culture NO GROWTH 2 DAYS    Report Status PENDING   Urinalysis, Routine w reflex microscopic     Status: Abnormal   Collection Time: 08/19/17  5:10 PM  Result Value Ref Range   Color, Urine STRAW (A) YELLOW   APPearance CLEAR CLEAR   Specific Gravity, Urine 1.011 1.005 - 1.030   pH 6.0 5.0 - 8.0   Glucose, UA NEGATIVE NEGATIVE  mg/dL   Hgb urine dipstick SMALL (A) NEGATIVE   Bilirubin Urine NEGATIVE NEGATIVE   Ketones, ur NEGATIVE NEGATIVE mg/dL   Protein, ur NEGATIVE NEGATIVE mg/dL   Nitrite NEGATIVE NEGATIVE   Leukocytes, UA NEGATIVE NEGATIVE   RBC / HPF 6-30 0 - 5 RBC/hpf   WBC, UA 0-5 0 - 5 WBC/hpf   Bacteria, UA NONE SEEN NONE SEEN   Squamous Epithelial / LPF NONE SEEN NONE SEEN   Mucus PRESENT   Urine rapid drug screen (hosp performed)     Status: Abnormal   Collection Time: 08/19/17  5:10 PM  Result Value Ref Range   Opiates POSITIVE (A) NONE DETECTED   Cocaine POSITIVE (A) NONE DETECTED   Benzodiazepines NONE DETECTED NONE DETECTED   Amphetamines NONE DETECTED NONE DETECTED   Tetrahydrocannabinol NONE DETECTED NONE DETECTED   Barbiturates NONE DETECTED NONE DETECTED    Comment: (NOTE) DRUG SCREEN FOR MEDICAL PURPOSES ONLY.  IF CONFIRMATION IS NEEDED FOR ANY PURPOSE, NOTIFY LAB WITHIN 5 DAYS. LOWEST DETECTABLE LIMITS FOR URINE DRUG SCREEN Drug Class                     Cutoff (ng/mL) Amphetamine and metabolites    1000 Barbiturate and metabolites    200 Benzodiazepine                 414 Tricyclics and metabolites  300 Opiates and metabolites        300 Cocaine and metabolites        300 THC                            50   Basic metabolic panel     Status: Abnormal   Collection Time: 08/20/17  7:08 AM  Result Value Ref Range   Sodium 138 135 - 145 mmol/L   Potassium 4.1 3.5 - 5.1 mmol/L   Chloride 107 101 - 111 mmol/L   CO2 24 22 - 32 mmol/L   Glucose, Bld 122 (H) 65 - 99 mg/dL   BUN 6 6 - 20 mg/dL   Creatinine, Ser 0.60 (L) 0.61 - 1.24 mg/dL   Calcium 8.4 (L) 8.9 - 10.3 mg/dL   GFR calc non Af Amer >60 >60 mL/min   GFR calc Af Amer >60 >60 mL/min    Comment: (NOTE) The eGFR has been calculated using the CKD EPI equation. This calculation has not been validated in all clinical situations. eGFR's persistently <60 mL/min signify possible Chronic Kidney Disease.    Anion gap 7  5 - 15  CBC     Status: Abnormal   Collection Time: 08/20/17  7:08 AM  Result Value Ref Range   WBC 7.7 4.0 - 10.5 K/uL   RBC 3.71 (L) 4.22 - 5.81 MIL/uL   Hemoglobin 12.2 (L) 13.0 - 17.0 g/dL   HCT 36.2 (L) 39.0 - 52.0 %   MCV 97.6 78.0 - 100.0 fL   MCH 32.9 26.0 - 34.0 pg   MCHC 33.7 30.0 - 36.0 g/dL   RDW 13.1 11.5 - 15.5 %   Platelets 190 150 - 400 K/uL     Lipid Panel     Component Value Date/Time   CHOL 152 07/05/2016 1130   TRIG 84 07/05/2016 1130   HDL 59 07/05/2016 1130   CHOLHDL 2.6 07/05/2016 1130   VLDL 17 07/05/2016 1130   LDLCALC 76 07/05/2016 1130     Lab Results  Component Value Date   HGBA1C  07/28/2009    5.1 (NOTE) The ADA recommends the following therapeutic goal for glycemic control related to Hgb A1c measurement: Goal of therapy: <6.5 Hgb A1c  Reference: American Diabetes Association: Clinical Practice Recommendations 2010, Diabetes Care, 2010, 33: (Suppl  1).     Lab Results  Component Value Date   LDLCALC 76 07/05/2016   CREATININE 0.60 (L) 08/20/2017     HPI :   [name]. is an 37 y.o. 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 Patient was in his usual state of poor health until 2 weeks prior to admission when he noted onset of wheezing and dyspnea on exertion. He self treated with his nebulizers and inhalers and thought he was getting better until 2 days ago when the police raided his apartment and he had to be outside in the cold for 2-3 hours while there is a stent down in the apartment. Patient states that since then he has had worsened symptoms and his cough which was improving is now reductive of thick green phlegm.  Patient states that he coughed so  much last night that he started dry heaving without actual vomitus.  Review of systems is positive for subjective fevers chills and sweats over the past 2 weeks which again was improving until 2 nights ago. It is also positive for new onset diarrhea which started last night and caused him to soil his bed. This is unusual for him. He states that he's had 4 episodes of diarrhea since then. Describes it as loose but not watery. Patient states this does not feel like his usual Crohn's flare as he usually has left lower quadrant tenderness with this and he does not feel at this time.  Patient's last significant drug use was of marijuana and tobacco on a daily basis. He last used cocaine more than 1 year ago and his last use of IV drugs, heroin was more than 2 years ago. Patient adamantly denies any recent injection drug use.  ED Course:  The patient was noted to have significant cough and wheezing. He was treated with steroids and inhaled bronchodilators. He was noted to have worsened hypoxia on his usual 2 L of oxygen and significantly increased work of breathing with any attempts to ambulate.     HOSPITAL COURSE:   COPD with acute exacerbation (HCC)/alpha-1 antitrypsin deficiency/leukocytosis: Continue oxygen therapy.patient uses 2 L at baseline and discharged home on 2 L Initially found to be wheezing loudly, with labored breathing but able to speak in full sentences.  He does have a focal area on the right lung base which has prominent wheezing  . Given negative chest x-rays did not show any active cardiopulmonary disease Patient treated with steroids and IV doxycycline now switched to levofloxacin 5 days. Influenza PCR negative.  Diarrhea: Now resolved  Crohn's colitis Status post ileocolectomy and anastomosis in 2007. Denies any abdominal pain or tenderness.  Has not had a flare in the last 2 years.  Substance abuse: He is asking for additional  narcotics, has a history of IVDU 18 months ago, and cocaine a year ago.  Continues to smoke every day. continue Robitussin-DM twice a day as needed. UDS positive  for cocaine and opiates He has a follow-up with the pain clinic next month.  BIPOLAR DISORDER UNSPECIFIED Continue Klonopin per home dose we will not increase.  Essential hypertension: Continue diltiazem.  Neuropathy: Continue gabapentin.     Discharge Exam:   Blood pressure (!) 95/59, pulse 69, temperature 97.7 F (36.5 C), temperature source Oral, resp. rate 16, height _0  (1.651 m), weight 45.2 kg (99 lb 10.4 oz), SpO2 95 %.  Chest: Reasonable air entry bilaterally faint expiratory wheezes on the left but persistent inspiratory and expiratory wheezing at right base. I do not hear any rales or rhonchi. E to A changes were negative.  CV: Distant, regular, no audible murmurs. Abdomen: Thin, scaphoid NABS, soft, nondistended, minimal tenderness to palpation in bilateral lower quadrants. No masses. No rebound, no guarding. Extremities: No edema.  Neuro: Alert and oriented times 3; grossly nonfocal. Psych: Patient appears anxious but is cooperative. His mother is at bedside participating eagerly in history and having a hard time letting her son answer questions for himself.      Follow-up Information    Charlott Rakes, MD. Call.   Specialty:  Family Medicine Why:  hospital follow-up in 3-5 days Contact information: [address] [place] [address] [phone]           Signed: Reyne Dumas 08/21/2017, 11:02 AM        Time spent >  1 hour

## 2017-08-21 NOTE — Care Management Note (Signed)
Case Management Note  Patient Details  Name: Quay Simkin. MRN: [id number] Date of Birth: 1981/02/14  Subjective/Objective:                    Action/Plan:  Patient has home oxygen through [facility]. Patient has portable tank at bedside for when he is discharged. Expected Discharge Date:  08/22/17               Expected Discharge Plan:  Home/Self Care  In-House Referral:     Discharge planning Services     Post Acute Care Choice:  NA Choice offered to:  Patient  DME Arranged:  N/A DME Agency:  NA  HH Arranged:  NA HH Agency:  NA  Status of Service:  In process, will continue to follow  If discussed at Long Length of Stay Meetings, dates discussed:    Additional Comments:  Marilu Favre, RN 08/21/2017, 11:21 AM

## 2017-08-21 NOTE — Progress Notes (Signed)
Pt was on 02nc at 3 liters/min wean to 2 liters o2nc.

## 2017-08-21 NOTE — Progress Notes (Addendum)
[facility] PROGRESS NOTE    Progress Note  [name].  DQQ:[id number] DOB: 1980/08/12 DOA: 08/19/2017 PCP: Charlott Rakes, MD     Brief Narrative:   [name]. is an 37 y.o. 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  Oxygen at home, Crohn's disease, with ongoing substance abuse, hepatitis C, with poor functional status comes in for shortness of breath, the started 2 weeks prior to admission with wheezing, dyspnea on exertion and cough.  He self treated himself at home  Assessment/Plan:   COPD with acute exacerbation (HCC)/alpha-1 antitrypsin deficiency/leukocytosis: Continue oxygen therapy.patient uses 2 L at baseline and discharged home on 2 L Initially found to be wheezing loudly, with labored breathing but able to speak in full sentences. He does have a focal area on the right lung base which has prominent wheezing  . Given negative chest x-rays did not show any active cardiopulmonary disease Patient treated with steroids and IV doxycycline now switched to levofloxacin 5 days. Influenza PCR negative.  Diarrhea: Now resolved  Crohn's colitis Status post ileocolectomy and anastomosis in 2007. Denies any abdominal pain or tenderness. Has not had a flare in the last 2 years.  Substance abuse: He is asking for additional narcotics, has a history of IVDU 18 months ago, and cocaine a year ago. Continues to smoke every day. continue Robitussin-DM twice a day as needed. UDS positive  for cocaine and opiates He has a follow-up with the pain clinic next month.  BIPOLAR DISORDER UNSPECIFIED Continue Klonopin per home dose we will not increase.  Essential hypertension: Continue diltiazem.  Neuropathy: Continue gabapentin.         DVT prophylaxis: lovenox Family Communication:none Disposition Plan/Barrier to D/C:  DC home tomorrow Code Status:     Code Status Orders  (From  admission, onward)        Start     Ordered   08/19/17 1516  Full code  Continuous     08/19/17 1522      IV Access:    Peripheral IV   Procedures and diagnostic studies:   X-ray Chest Pa And Lateral  Result Date: 08/20/2017 CLINICAL DATA:  Shortness of breath. EXAM: CHEST  2 VIEW COMPARISON:  Radiographs of August 19, 2017. FINDINGS: The heart size and mediastinal contours are within normal limits. Both lungs are clear. No pneumothorax or pleural effusion is noted. Hyperexpansion of the lungs is noted. The visualized skeletal structures are unremarkable. IMPRESSION: No active cardiopulmonary disease. Hyperexpansion of the lungs suggesting chronic obstructive pulmonary disease. Electronically Signed   By: Marijo Conception, M.D.   On: 08/20/2017 09:23     Medical Consultants:    None.  Anti-Infectives:   Levaquin  Subjective:    [name]. he relates his cough is mildly improved he is complaining of diffuse chest tightness.  Objective:    Vitals:   08/20/17 2053 08/20/17 2132 08/21/17 0458 08/21/17 0849  BP:  114/75 (!) 95/59   Pulse:  84 69   Resp:  18 16   Temp:  98.3 F (36.8 C) 97.7 F (36.5 C)   TempSrc:  Oral Oral   SpO2: 99% 97% 98% 95%  Weight:      Height:        Intake/Output Summary (Last 24 hours) at 08/21/2017 1439 Last data filed at 08/21/2017 0944 Gross per 24 hour  Intake 2025 ml  Output 575 ml  Net 1450 ml  Filed Weights   08/20/17 0605  Weight: 45.2 kg (99 lb 10.4 oz)    Exam: General exam: In no acute distress. Respiratory system: Good air movement with wheezing bilaterally but more accentuated on the right lower lobe. Cardiovascular system: S1 & S2 heard, RRR.  Gastrointestinal system: Abdomen is nondistended, soft and nontender.  Central nervous system: Alert and oriented. No focal neurological deficits. Extremities: No pedal edema. Skin: No rashes, lesions or ulcers Psychiatry: Judgement and insight appear  normal. Mood & affect appropriate.    Data Reviewed:    Labs: Basic Metabolic Panel: Recent Labs  Lab 08/19/17 1058 08/19/17 1524 08/20/17 0708  NA 138  --  138  K 3.9  --  4.1  CL 106  --  107  CO2 21*  --  24  GLUCOSE 88  --  122*  BUN 5*  --  6  CREATININE 0.69 0.64 0.60*  CALCIUM 9.1  --  8.4*   GFR Estimated Creatinine Clearance: 80.8 mL/min (A) (by C-G formula based on SCr of 0.6 mg/dL (L)). Liver Function Tests: Recent Labs  Lab 08/19/17 1058  AST 39  ALT 52  ALKPHOS 98  BILITOT 0.7  PROT 7.0  ALBUMIN 3.8   No results for input(s): LIPASE, AMYLASE in the last 168 hours. No results for input(s): AMMONIA in the last 168 hours. Coagulation profile No results for input(s): INR, PROTIME in the last 168 hours.  CBC: Recent Labs  Lab 08/19/17 1058 08/19/17 1524 08/20/17 0708  WBC 13.4* 9.2 7.7  NEUTROABS 10.3*  --   --   HGB 15.3 14.8 12.2*  HCT 45.5 43.7 36.2*  MCV 96.6 97.3 97.6  PLT 217 204 190   Cardiac Enzymes: No results for input(s): CKTOTAL, CKMB, CKMBINDEX, TROPONINI in the last 168 hours. BNP (last 3 results) No results for input(s): PROBNP in the last 8760 hours. CBG: No results for input(s): GLUCAP in the last 168 hours. D-Dimer: No results for input(s): DDIMER in the last 72 hours. Hgb A1c: No results for input(s): HGBA1C in the last 72 hours. Lipid Profile: No results for input(s): CHOL, HDL, LDLCALC, TRIG, CHOLHDL, LDLDIRECT in the last 72 hours. Thyroid function studies: No results for input(s): TSH, T4TOTAL, T3FREE, THYROIDAB in the last 72 hours.  Invalid input(s): FREET3 Anemia work up: No results for input(s): VITAMINB12, FOLATE, FERRITIN, TIBC, IRON, RETICCTPCT in the last 72 hours. Sepsis Labs: Recent Labs  Lab 08/19/17 1058 08/19/17 1117 08/19/17 1524 08/20/17 0708  WBC 13.4*  --  9.2 7.7  LATICACIDVEN  --  1.08  --   --    Microbiology Recent Results (from the past 240 hour(s))  Culture, blood (routine x 2)      Status: None (Preliminary result)   Collection Time: 08/19/17  3:22 PM  Result Value Ref Range Status   Specimen Description BLOOD RIGHT ANTECUBITAL  Final   Special Requests   Final    BOTTLES DRAWN AEROBIC AND ANAEROBIC Blood Culture adequate volume   Culture NO GROWTH 2 DAYS  Final   Report Status PENDING  Incomplete  Culture, blood (routine x 2)     Status: None (Preliminary result)   Collection Time: 08/19/17  3:25 PM  Result Value Ref Range Status   Specimen Description BLOOD LEFT ANTECUBITAL  Final   Special Requests   Final    BOTTLES DRAWN AEROBIC AND ANAEROBIC Blood Culture adequate volume   Culture NO GROWTH 2 DAYS  Final   Report Status PENDING  Incomplete     Medications:   . clonazePAM  0.5 mg Oral TID  . diltiazem  300 mg Oral Daily  . enoxaparin (LOVENOX) injection  40 mg Subcutaneous Q24H  . feeding supplement (ENSURE ENLIVE)  237 mL Oral TID BM  . gabapentin  600 mg Oral TID  . ipratropium-albuterol  3 mL Nebulization TID  . levofloxacin  750 mg Oral Daily  . mometasone-formoterol  2 puff Inhalation BID  . predniSONE  40 mg Oral Q breakfast   Continuous Infusions:     LOS: 2 days   Reyne Dumas  [facility]  pager [phone]  *Please refer to [email or web address], password [email or web address] to get updated schedule on who will round on this patient, as hospitalists switch teams weekly. If 7PM-7AM, please contact night-coverage at [email or web address], password [email or web address] for any overnight needs.  08/21/2017, 2:39 PM

## 2017-08-22 DIAGNOSIS — F1910 Other psychoactive substance abuse, uncomplicated: Secondary | ICD-10-CM

## 2017-08-22 DIAGNOSIS — J441 Chronic obstructive pulmonary disease with (acute) exacerbation: Principal | ICD-10-CM

## 2017-08-22 MED ORDER — NICOTINE 14 MG/24HR TD PT24
14.0000 mg | MEDICATED_PATCH | Freq: Every day | TRANSDERMAL | Status: DC
  Administered 2017-08-22 – 2017-08-23 (×2): 14 mg via TRANSDERMAL
  Filled 2017-08-22 (×2): qty 1

## 2017-08-22 NOTE — Progress Notes (Signed)
[facility] PROGRESS NOTE    Progress Note  [name].  OIB:[id number] DOB: 14-Jul-1981 DOA: 08/19/2017 PCP: Charlott Rakes, MD     Brief Narrative:   Clifford Hawk Tremar Wickens. is an 37 y.o. 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  Oxygen at home, Crohn's disease, with ongoing substance abuse, hepatitis C, with poor functional status comes in for shortness of breath, that started 2 weeks prior to admission with wheezing, dyspnea on exertion and cough.  He self treated himself at home.  Admitted for COPD exacerbation.  Assessment/Plan:   COPD with acute exacerbation (HCC)/alpha-1 antitrypsin deficiency/leukocytosis: Treated with oxygen, bronchodilator nebulizations, a dose of IV Solu-Medrol followed by oral prednisone, antibiotics (levofloxacin). Chest x-ray 1/28: No active cardiopulmonary disease.  COPD changes. Flu panel PCR and HIV screen negative. Added flutter valve 1/30. Slowly improving but dyspnea not at baseline.  Inpatient close monitoring for additional day prior to discharge home possibly in a.m. Tobacco cessation counseled.  Acute on chronic respiratory failure with hypoxia Precipitated by COPD exacerbation.  Back to home oxygen 2.5 L/min.  Diarrhea: Now resolved  Crohn's colitis Status post ileocolectomy and anastomosis in 2007. Denies any abdominal pain or tenderness. Has not had a flare in the last 2 years.  Substance abuse: He was asking for additional narcotics, has a history of IVDU 18 months ago, and cocaine a year ago. Continues to smoke every day. continue Robitussin-DM twice a day as needed. UDS positive  for cocaine and opiates He has a follow-up with the pain clinic with Dr. Nada Libman on 1/31 afternoon.  BIPOLAR DISORDER UNSPECIFIED Continue Klonopin per home dose we will not increase.  Stable.  Essential hypertension: Continue diltiazem.  Neuropathy: Continue  gabapentin.         DVT prophylaxis: lovenox Family Communication: Discussed with patient's mother at bedside. Disposition Plan/Barrier to D/C:  DC home 1/31 Code Status:     Code Status Orders  (From admission, onward)        Start     Ordered   08/19/17 1516  Full code  Continuous     08/19/17 1522      IV Access:    Peripheral IV   Procedures and diagnostic studies:   No results found.   Medical Consultants:    None.  Anti-Infectives:   Levaquin  Subjective:    Continues to improve.  Dyspnea improved by about 50%.  Cough with intermittent mild yellow sputum.  Chest and upper abdominal pain with coughing.  Reports that he stays with his mother.  Ambulates with the help of a cane.  Uses 2.5 L/min oxygen.  Smokes half pack per day and THC but denies cocaine use.  Objective:    Vitals:   08/21/17 1500 08/21/17 2049 08/22/17 0500 08/22/17 1434  BP: 107/67 108/65 111/72 118/71  Pulse: 90 82 73 88  Resp: 20 20 20 20   Temp: 98.4 F (36.9 C) 98 F (36.7 C) 98.3 F (36.8 C) 98.2 F (36.8 C)  TempSrc: Oral Oral Oral Oral  SpO2: 97% 97% 98% 96%  Weight:      Height:        Intake/Output Summary (Last 24 hours) at 08/22/2017 1657 Last data filed at 08/22/2017 1543 Gross per 24 hour  Intake 1560 ml  Output -  Net 1560 ml   Filed Weights   08/20/17 0605  Weight: 45.2 kg (99 lb 10.4 oz)    Exam: General exam: Young  male, small built, frail and chronically ill looking lying propped up in bed. Respiratory system: Clear anteriorly.  Few expiratory medium pitched rhonchi posteriorly.  No crackles.  No increased work of breathing. Cardiovascular system: S1 & S2 heard, RRR.  No JVD, murmurs or pedal edema. Gastrointestinal system: Abdomen is nondistended, soft and nontender.  Normal bowel sounds heard. Central nervous system: Alert and oriented. No focal neurological deficits. Extremities: No pedal edema. Skin: No rashes, lesions or  ulcers Psychiatry: Judgement and insight appear normal. Mood & affect appropriate.    Data Reviewed:    Labs: Basic Metabolic Panel: Recent Labs  Lab 08/19/17 1058 08/19/17 1524 08/20/17 0708  NA 138  --  138  K 3.9  --  4.1  CL 106  --  107  CO2 21*  --  24  GLUCOSE 88  --  122*  BUN 5*  --  6  CREATININE 0.69 0.64 0.60*  CALCIUM 9.1  --  8.4*   GFR Estimated Creatinine Clearance: 80.8 mL/min (A) (by C-G formula based on SCr of 0.6 mg/dL (L)). Liver Function Tests: Recent Labs  Lab 08/19/17 1058  AST 39  ALT 52  ALKPHOS 98  BILITOT 0.7  PROT 7.0  ALBUMIN 3.8    CBC: Recent Labs  Lab 08/19/17 1058 08/19/17 1524 08/20/17 0708  WBC 13.4* 9.2 7.7  NEUTROABS 10.3*  --   --   HGB 15.3 14.8 12.2*  HCT 45.5 43.7 36.2*  MCV 96.6 97.3 97.6  PLT 217 204 190   Sepsis Labs: Recent Labs  Lab 08/19/17 1058 08/19/17 1117 08/19/17 1524 08/20/17 0708  WBC 13.4*  --  9.2 7.7  LATICACIDVEN  --  1.08  --   --    Microbiology Recent Results (from the past 240 hour(s))  Culture, blood (routine x 2)     Status: None (Preliminary result)   Collection Time: 08/19/17  3:22 PM  Result Value Ref Range Status   Specimen Description BLOOD RIGHT ANTECUBITAL  Final   Special Requests   Final    BOTTLES DRAWN AEROBIC AND ANAEROBIC Blood Culture adequate volume   Culture NO GROWTH 3 DAYS  Final   Report Status PENDING  Incomplete  Culture, blood (routine x 2)     Status: None (Preliminary result)   Collection Time: 08/19/17  3:25 PM  Result Value Ref Range Status   Specimen Description BLOOD LEFT ANTECUBITAL  Final   Special Requests   Final    BOTTLES DRAWN AEROBIC AND ANAEROBIC Blood Culture adequate volume   Culture NO GROWTH 3 DAYS  Final   Report Status PENDING  Incomplete     Medications:   . clonazePAM  0.5 mg Oral TID  . diltiazem  300 mg Oral Daily  . enoxaparin (LOVENOX) injection  40 mg Subcutaneous Q24H  . feeding supplement (ENSURE ENLIVE)  237 mL Oral  TID BM  . gabapentin  600 mg Oral TID  . ipratropium-albuterol  3 mL Nebulization TID  . levofloxacin  750 mg Oral Daily  . mometasone-formoterol  2 puff Inhalation BID  . nicotine  14 mg Transdermal Daily  . predniSONE  40 mg Oral Q breakfast   Continuous Infusions:     LOS: 3 days   Vernell Leep, MD, FACP, [facility]. [facility] Pager [phone]  If 7PM-7AM, please contact night-coverage [email or web address] Password [email or web address] 08/22/2017, 5:04 PM

## 2017-08-23 DIAGNOSIS — I10 Essential (primary) hypertension: Secondary | ICD-10-CM

## 2017-08-23 MED ORDER — PREDNISONE 10 MG PO TABS: ORAL_TABLET | ORAL | 0 refills | Status: DC

## 2017-08-23 MED ORDER — ALBUTEROL SULFATE HFA 108 (90 BASE) MCG/ACT IN AERS: INHALATION_SPRAY | RESPIRATORY_TRACT | 0 refills | Status: AC

## 2017-08-23 MED ORDER — TIOTROPIUM BROMIDE MONOHYDRATE 18 MCG IN CAPS: 18.0000 ug | ORAL_CAPSULE | Freq: Every day | RESPIRATORY_TRACT | 0 refills | Status: AC

## 2017-08-23 MED ORDER — NICOTINE 14 MG/24HR TD PT24: 14.0000 mg | MEDICATED_PATCH | Freq: Every day | TRANSDERMAL | 0 refills | Status: AC

## 2017-08-23 MED ORDER — TIOTROPIUM BROMIDE MONOHYDRATE 2.5 MCG/ACT IN AERS: 2.0000 | INHALATION_SPRAY | Freq: Every day | RESPIRATORY_TRACT | 0 refills | Status: DC

## 2017-08-23 NOTE — Discharge Instructions (Signed)
Please get your medications reviewed and adjusted by your Primary MD.  Please request your Primary MD to go over all Hospital Tests and Procedure/Radiological results at the follow up, please get all Hospital records sent to your Prim MD by signing hospital release before you go home.  If you had Pneumonia of Lung problems at the Hospital: Please get a 2 view Chest X ray done in 6-8 weeks after hospital discharge or sooner if instructed by your Primary MD.  If you have Congestive Heart Failure: Please call your Cardiologist or Primary MD anytime you have any of the following symptoms:  1) 3 pound weight gain in 24 hours or 5 pounds in 1 week  2) shortness of breath, with or without a dry hacking cough  3) swelling in the hands, feet or stomach  4) if you have to sleep on extra pillows at night in order to breathe  Follow cardiac low salt diet and 1.5 lit/day fluid restriction.  If you have diabetes Accuchecks 4 times/day, Once in AM empty stomach and then before each meal. Log in all results and show them to your primary doctor at your next visit. If any glucose reading is under 80 or above 300 call your primary MD immediately.  If you have Seizure/Convulsions/Epilepsy: Please do not drive, operate heavy machinery, participate in activities at heights or participate in high speed sports until you have seen by Primary MD or a Neurologist and advised to do so again.  If you had Gastrointestinal Bleeding: Please ask your Primary MD to check a complete blood count within one week of discharge or at your next visit. Your endoscopic/colonoscopic biopsies that are pending at the time of discharge, will also need to followed by your Primary MD.  Get Medicines reviewed and adjusted. Please take all your medications with you for your next visit with your Primary MD  Please request your Primary MD to go over all hospital tests and procedure/radiological results at the follow up, please ask your  Primary MD to get all Hospital records sent to his/her office.  If you experience worsening of your admission symptoms, develop shortness of breath, life threatening emergency, suicidal or homicidal thoughts you must seek medical attention immediately by calling 911 or calling your MD immediately  if symptoms less severe.  You must read complete instructions/literature along with all the possible adverse reactions/side effects for all the Medicines you take and that have been prescribed to you. Take any new Medicines after you have completely understood and accpet all the possible adverse reactions/side effects.   Do not drive or operate heavy machinery when taking Pain medications.   Do not take more than prescribed Pain, Sleep and Anxiety Medications  Special Instructions: If you have smoked or chewed Tobacco  in the last 2 yrs please stop smoking, stop any regular Alcohol  and or any Recreational drug use.  Wear Seat belts while driving.  Please note You were cared for by a hospitalist during your hospital stay. If you have any questions about your discharge medications or the care you received while you were in the hospital after you are discharged, you can call the unit and asked to speak with the hospitalist on call if the hospitalist that took care of you is not available. Once you are discharged, your primary care physician will handle any further medical issues. Please note that NO REFILLS for any discharge medications will be authorized once you are discharged, as it is imperative that you  return to your primary care physician (or establish a relationship with a primary care physician if you do not have one) for your aftercare needs so that they can reassess your need for medications and monitor your lab values.  You can reach the hospitalist office at phone [phone] or fax [phone]   If you do not have a primary care physician, you can call [phone] for a physician  referral.   Chronic Obstructive Pulmonary Disease Exacerbation Chronic obstructive pulmonary disease (COPD) is a common lung problem. In COPD, the flow of air from the lungs is limited. COPD exacerbations are times that breathing gets worse and you need extra treatment. Without treatment they can be life threatening. If they happen often, your lungs can become more damaged. If your COPD gets worse, your doctor may treat you with:  Medicines.  Oxygen.  Different ways to clear your airway, such as using a mask.  Follow these instructions at home:  Do not smoke.  Avoid tobacco smoke and other things that bother your lungs.  If given, take your antibiotic medicine as told. Finish the medicine even if you start to feel better.  Only take medicines as told by your doctor.  Drink enough fluids to keep your pee (urine) clear or pale yellow (unless your doctor has told you not to).  Use a cool mist machine (vaporizer).  If you use oxygen or a machine that turns liquid medicine into a mist (nebulizer), continue to use them as told.  Keep up with shots (vaccinations) as told by your doctor.  Exercise regularly.  Eat healthy foods.  Keep all doctor visits as told. Get help right away if:  You are very short of breath and it gets worse.  You have trouble talking.  You have bad chest pain.  You have blood in your spit (sputum).  You have a fever.  You keep throwing up (vomiting).  You feel weak, or you pass out (faint).  You feel confused.  You keep getting worse. This information is not intended to replace advice given to you by your health care provider. Make sure you discuss any questions you have with your health care provider. Document Released: 06/29/2011 Document Revised: 12/16/2015 Document Reviewed: 03/14/2013 Elsevier Interactive Patient Education  2017 [employer].

## 2017-08-23 NOTE — Progress Notes (Signed)
[name]. dischargd per MD order. Discussed with the patient and all questions fully answered.  VSS, Skin clean, dry and intact without evidence of skin break down, no evidence of skin tears noted.  IV catheter discontinued intact. Site without signs and symptoms of complications. Dressing and pressure applied.  An After Visit Summary was printed and given to the patient. Patient received prescriptions and informed on where to pick up those electronically sent.  Discharge education completed with patient including follow up instructions, medication list, d/c activities limitations if indicated, with other d/c instructions as indicated by MD - patient able to verbalize understanding, all questions fully answered.   Patient instructed to return to ED, call 911, or call MD for any changes in condition.   Patient discharged home via private auto.

## 2017-08-23 NOTE — Discharge Summary (Addendum)
Physician Discharge Summary  [name]. GNF:[id number] DOB: 12/31/1980  PCP: Elenore Paddy, FNP  Admit date: 08/19/2017 Discharge date: 08/23/2017  Recommendations for Outpatient Follow-up:  1. QIONGE Jerilee Hoh, FNP/PCP in 5 days.  Please follow final blood culture results that were sent from the hospital. 2. Dr. Gwenevere Ghazi, Pulmonology: Patient is advised to keep the appointment he has for next week. 3. Dr. Shanon Ace, Pain MD: Patient advised to keep the appointment that he has for this afternoon. 4. Recommend repeating urine microscopy in 1-2 weeks.  Please see discussion below.   Home Health: None Equipment/Devices: None    Discharge Condition: Improved and stable. CODE STATUS: Full Diet recommendation: Heart healthy diet.  Discharge Diagnoses:  Principal Problem:   COPD with acute exacerbation ([place]) Active Problems:   BIPOLAR DISORDER UNSPECIFIED   Regional enteritis ([place])   Asthma   Substance abuse ([place])   Essential hypertension   Bandemia   Brief Summary: Clifford Hollar. is an 37 y.o. 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  He self treated himself at home.  Admitted for COPD exacerbation.  Assessment and plan:  COPD with acute exacerbation (HCC)/alpha-1 antitrypsin deficiency/leukocytosis: Treated with oxygen, bronchodilator nebulizations, a dose of IV Solu-Medrol followed by oral prednisone, antibiotics (levofloxacin). Chest x-ray 1/28: No active cardiopulmonary disease.  COPD changes. Flu panel PCR and HIV screen negative. Added flutter valve 1/30. Tobacco cessation counseled. Clinically improved.  Completed course of levofloxacin.  Advised to complete oral  prednisone taper, continue to use flutter valve, prior home dose of oxygen, tobacco cessation, and rest of medications as outlined below. I personally called patient's pharmacy and clarified medications with the pharmacist to avoid duplication (some medications had already been sent there 2 days prior). Patient is advised to keep the pulmonary follow-up appointment that he has for next week.  Acute on chronic respiratory failure with hypoxia Precipitated by COPD exacerbation.  Acute hypoxia resolved.  Return to prior home level of oxygen at discharge.  Diarrhea: Resolved  Crohn's colitis Status post ileocolectomy and anastomosis in 2007. Denies any abdominal pain or tenderness. Has not had a flare in the last 2 years.  Substance abuse: He was asking for additional narcotics, has a history of IVDU 18 months ago, and cocaine a year ago. Continues to smoke every day. UDSpositive for cocaine and opiates He has a follow-up with the pain clinic with Dr. Nada Libman on 1/31 afternoon. Cessation counseled.  BIPOLAR DISORDER UNSPECIFIED Continue Klonopin per home dose we will not increase.  Stable.  Essential hypertension: Continue diltiazem.  Controlled.  Neuropathy: Continue gabapentin.  Microscopic hematuria Unclear etiology.  No gross hematuria or urinary symptoms reported.  Recommend repeating urine microscopy in 1-2 weeks after discharge and evaluate if he has persistent hematuria.      Consultations:  None  Procedures:  None   Discharge Instructions  Discharge Instructions    Call MD for:  difficulty breathing, headache or visual disturbances   Complete by:  As directed    Call MD for:  extreme fatigue   Complete by:  As directed    Call MD for:  persistant dizziness or light-headedness   Complete by:  As directed    Call MD for:  severe uncontrolled pain  Complete by:  As directed    Call MD for:  temperature >100.4   Complete by:  As directed     Diet - low sodium heart healthy   Complete by:  As directed    Increase activity slowly   Complete by:  As directed        Medication List    STOP taking these medications   acetaminophen-codeine 300-30 MG tablet Commonly known as:  TYLENOL #3   BC HEADACHE POWDER PO   benzonatate 200 MG capsule Commonly known as:  TESSALON   feeding supplement (ENSURE ENLIVE) Liqd   guaiFENesin 600 MG 12 hr tablet Commonly known as:  MUCINEX   levofloxacin 750 MG tablet Commonly known as:  LEVAQUIN   Tiotropium Bromide Monohydrate 2.5 MCG/ACT Aers Commonly known as:  SPIRIVA RESPIMAT Replaced by:  tiotropium 18 MCG inhalation capsule     TAKE these medications   albuterol (2.5 MG/3ML) 0.083% nebulizer solution Commonly known as:  PROVENTIL Take 3 mLs (2.5 mg total) by nebulization every 6 (six) hours as needed for wheezing or shortness of breath.   albuterol 108 (90 Base) MCG/ACT inhaler Commonly known as:  PROAIR HFA INHALE 2 PUFFS BY MOUTH EVERY 6 HOURS AS NEEDED FOR WHEEZING AND SHORTNESS OF BREATH   budesonide-formoterol 160-4.5 MCG/ACT inhaler Commonly known as:  SYMBICORT Inhale 2 puffs into the lungs 2 (two) times daily.   clonazePAM 0.5 MG tablet Commonly known as:  KLONOPIN Take 0.5 mg 3 (three) times daily by mouth.   dextromethorphan-guaiFENesin 30-600 MG 12hr tablet Commonly known as:  MUCINEX DM Take 2 tablets by mouth 2 (two) times daily as needed for cough.   diltiazem 300 MG 24 hr capsule Commonly known as:  TIAZAC Take 300 mg by mouth daily.   gabapentin 600 MG tablet Commonly known as:  NEURONTIN Take 1 tablet (600 mg total) 3 (three) times daily by mouth.   hydrOXYzine 25 MG tablet Commonly known as:  ATARAX/VISTARIL Take 1 tablet (25 mg total) by mouth every 6 (six) hours as needed for anxiety.   ibuprofen 200 MG tablet Commonly known as:  ADVIL,MOTRIN Take 800 mg by mouth every 6 (six) hours as needed for moderate pain.   nicotine 14 mg/24hr  patch Commonly known as:  NICODERM CQ - dosed in mg/24 hours Place 1 patch (14 mg total) onto the skin daily. Start taking on:  08/24/2017   predniSONE 10 MG tablet Commonly known as:  DELTASONE Take 4 tabs daily for 3 days, then 3 tabs daily for 3 days, then 2 tabs daily for 3 days, then 1 tab daily for 3 days, then stop.   promethazine 25 MG tablet Commonly known as:  PHENERGAN Take 25 mg daily as needed by mouth.   tiotropium 18 MCG inhalation capsule Commonly known as:  SPIRIVA HANDIHALER Place 1 capsule (18 mcg total) into inhaler and inhale daily. Replaces:  Tiotropium Bromide Monohydrate 2.5 MCG/ACT Aers      Follow-up Information    Elenore Paddy, FNP. Schedule an appointment as soon as possible for a visit in 5 day(s).   Specialty:  Family Medicine Contact information: [place] [address] [phone]        Gwenevere Ghazi, MD Follow up.   Specialty:  Pulmonary Disease Why:  Keep the follow-up appointment that you have for next week. Contact information: [facility] [address] [place] [address] [phone]        Dr. Con Memos MD  Follow up.   Why:  Keep the appointment that you have for this afternoon.         Allergies  Allergen Reactions  . Penicillins Anaphylaxis and Swelling    Has patient had a PCN reaction causing immediate rash, facial/tongue/throat swelling, SOB or lightheadedness with hypotension: Yes Has patient had a PCN reaction causing severe rash involving mucus membranes or skin necrosis: No Has patient had a PCN reaction that required hospitalization No Has patient had a PCN reaction occurring within the last 10 years: No If all of the above answers are "NO", then may proceed with Cephalosporin use.   . Yellow Dyes (Non-Tartrazine) Swelling    Pt states this reaction was as a child and he has not had this reaction to anything as an adult  . Diphenhydramine Hcl Hives and Rash  .  Red Dye Swelling    Pt states this reaction was as a child and he has not had this reaction to anything as an adult      Procedures/Studies: X-ray Chest Pa And Lateral  Result Date: 08/20/2017 CLINICAL DATA:  Shortness of breath. EXAM: CHEST  2 VIEW COMPARISON:  Radiographs of August 19, 2017. FINDINGS: The heart size and mediastinal contours are within normal limits. Both lungs are clear. No pneumothorax or pleural effusion is noted. Hyperexpansion of the lungs is noted. The visualized skeletal structures are unremarkable. IMPRESSION: No active cardiopulmonary disease. Hyperexpansion of the lungs suggesting chronic obstructive pulmonary disease. Electronically Signed   By: Marijo Conception, M.D.   On: 08/20/2017 09:23   Dg Chest 2 View  Result Date: 08/19/2017 CLINICAL DATA:  Cough and emesis for 24 hours EXAM: CHEST  2 VIEW COMPARISON:  06/04/2017 FINDINGS: Cardiac shadow is within normal limits. The lungs are hyperinflated bilaterally. No focal infiltrate or sizable effusion is seen. No bony abnormality is noted. IMPRESSION: COPD without acute abnormality. Electronically Signed   By: Inez Catalina M.D.   On: 08/19/2017 11:33      Subjective: Patient continues to gradually feel better.  Feels that his breathing is almost back to baseline.  Mild intermittent dry cough.  No chest pain.  No fever or chills.  As per RN, no acute issues reported.  Discharge Exam:  Vitals:   08/22/17 2119 08/23/17 0540 08/23/17 0846 08/23/17 0848  BP: 117/69 106/64    Pulse: 82 (!) 59    Resp: 18 17    Temp: 98.5 F (36.9 C) 98.6 F (37 C)    TempSrc: Oral     SpO2: 96% 100% 97% 98%  Weight:      Height:        General exam: Young male, small built, frail and chronically ill looking lying propped up in bed. Respiratory system: Clear anteriorly. Occasional expiratory rhonchi posteriorly.  No crackles.  No increased work of breathing.  Able to speak in full sentences. Cardiovascular system: S1 & S2  heard, RRR.  No JVD, murmurs or pedal edema. Gastrointestinal system: Abdomen is nondistended, soft and nontender.  Normal bowel sounds heard. Central nervous system: Alert and oriented. No focal neurological deficits. Extremities: No pedal edema. Skin: No rashes, lesions or ulcers Psychiatry: Judgement and insight appear normal. Mood & affect appropriate.       The results of significant diagnostics from this hospitalization (including imaging, microbiology, ancillary and laboratory) are listed below for reference.     Microbiology: Recent Results (from the past 240 hour(s))  Culture, blood (routine x 2)  Status: None (Preliminary result)   Collection Time: 08/19/17  3:22 PM  Result Value Ref Range Status   Specimen Description BLOOD RIGHT ANTECUBITAL  Final   Special Requests   Final    BOTTLES DRAWN AEROBIC AND ANAEROBIC Blood Culture adequate volume   Culture NO GROWTH 3 DAYS  Final   Report Status PENDING  Incomplete  Culture, blood (routine x 2)     Status: None (Preliminary result)   Collection Time: 08/19/17  3:25 PM  Result Value Ref Range Status   Specimen Description BLOOD LEFT ANTECUBITAL  Final   Special Requests   Final    BOTTLES DRAWN AEROBIC AND ANAEROBIC Blood Culture adequate volume   Culture NO GROWTH 3 DAYS  Final   Report Status PENDING  Incomplete     Labs: CBC: Recent Labs  Lab 08/19/17 1058 08/19/17 1524 08/20/17 0708  WBC 13.4* 9.2 7.7  NEUTROABS 10.3*  --   --   HGB 15.3 14.8 12.2*  HCT 45.5 43.7 36.2*  MCV 96.6 97.3 97.6  PLT 217 204 045   Basic Metabolic Panel: Recent Labs  Lab 08/19/17 1058 08/19/17 1524 08/20/17 0708  NA 138  --  138  K 3.9  --  4.1  CL 106  --  107  CO2 21*  --  24  GLUCOSE 88  --  122*  BUN 5*  --  6  CREATININE 0.69 0.64 0.60*  CALCIUM 9.1  --  8.4*   Liver Function Tests: Recent Labs  Lab 08/19/17 1058  AST 39  ALT 52  ALKPHOS 98  BILITOT 0.7  PROT 7.0  ALBUMIN 3.8   Urinalysis     Component Value Date/Time   COLORURINE STRAW (A) 08/19/2017 1710   APPEARANCEUR CLEAR 08/19/2017 1710   LABSPEC 1.011 08/19/2017 1710   PHURINE 6.0 08/19/2017 1710   GLUCOSEU NEGATIVE 08/19/2017 1710   HGBUR SMALL (A) 08/19/2017 1710   BILIRUBINUR NEGATIVE 08/19/2017 1710   KETONESUR NEGATIVE 08/19/2017 1710   PROTEINUR NEGATIVE 08/19/2017 1710   UROBILINOGEN 1.0 10/05/2014 1501   NITRITE NEGATIVE 08/19/2017 1710   LEUKOCYTESUR NEGATIVE 08/19/2017 1710      Time coordinating discharge: Less than 30 minutes  SIGNED:  Vernell Leep, MD, FACP, [facility]. [facility] Pager [phone]  If 7PM-7AM, please contact night-coverage [email or web address] Password [email or web address] 08/23/2017, 10:00 AM

## 2017-08-24 LAB — CULTURE, BLOOD (ROUTINE X 2)
Culture: NO GROWTH
Culture: NO GROWTH
SPECIAL REQUESTS: ADEQUATE
Special Requests: ADEQUATE

## 2017-09-08 ENCOUNTER — Emergency Department (HOSPITAL_COMMUNITY)
Admission: EM | Admit: 2017-09-08 | Discharge: 2017-09-08 | Disposition: A | Discharge status: Home / Self Care | Payer: Medicaid Other | Attending: Emergency Medicine | Admitting: Emergency Medicine

## 2017-09-08 ENCOUNTER — Encounter (HOSPITAL_COMMUNITY): Payer: Self-pay | Admitting: Emergency Medicine

## 2017-09-08 ENCOUNTER — Emergency Department (HOSPITAL_COMMUNITY): Payer: Medicaid Other

## 2017-09-08 DIAGNOSIS — F17210 Nicotine dependence, cigarettes, uncomplicated: Secondary | ICD-10-CM | POA: Insufficient documentation

## 2017-09-08 DIAGNOSIS — S20219A Contusion of unspecified front wall of thorax, initial encounter: Secondary | ICD-10-CM | POA: Diagnosis not present

## 2017-09-08 DIAGNOSIS — J449 Chronic obstructive pulmonary disease, unspecified: Secondary | ICD-10-CM | POA: Diagnosis not present

## 2017-09-08 DIAGNOSIS — Y999 Unspecified external cause status: Secondary | ICD-10-CM | POA: Diagnosis not present

## 2017-09-08 DIAGNOSIS — S0083XA Contusion of other part of head, initial encounter: Secondary | ICD-10-CM | POA: Diagnosis not present

## 2017-09-08 DIAGNOSIS — Y929 Unspecified place or not applicable: Secondary | ICD-10-CM | POA: Diagnosis not present

## 2017-09-08 DIAGNOSIS — Y939 Activity, unspecified: Secondary | ICD-10-CM | POA: Diagnosis not present

## 2017-09-08 DIAGNOSIS — R062 Wheezing: Secondary | ICD-10-CM | POA: Diagnosis not present

## 2017-09-08 DIAGNOSIS — R51 Headache: Secondary | ICD-10-CM | POA: Diagnosis present

## 2017-09-08 MED ORDER — ALBUTEROL SULFATE (2.5 MG/3ML) 0.083% IN NEBU
2.5000 mg | INHALATION_SOLUTION | Freq: Once | RESPIRATORY_TRACT | Status: AC
  Administered 2017-09-08: 2.5 mg via RESPIRATORY_TRACT
  Filled 2017-09-08: qty 3

## 2017-09-08 MED ORDER — ETODOLAC 400 MG PO TABS: 400.0000 mg | ORAL_TABLET | Freq: Two times a day (BID) | ORAL | 0 refills | Status: AC | PRN

## 2017-09-08 MED ORDER — IPRATROPIUM-ALBUTEROL 0.5-2.5 (3) MG/3ML IN SOLN
3.0000 mL | Freq: Once | RESPIRATORY_TRACT | Status: AC
  Administered 2017-09-08: 3 mL via RESPIRATORY_TRACT
  Filled 2017-09-08: qty 3
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
## 2017-09-08 NOTE — ED Notes (Signed)
Pt returned from radiology.

## 2017-10-01 ENCOUNTER — Emergency Department (HOSPITAL_COMMUNITY)
Admission: EM | Admit: 2017-10-01 | Discharge: 2017-10-01 | Discharge status: Against Medical Advice | Payer: Medicaid Other

## 2017-10-01 ENCOUNTER — Other Ambulatory Visit: Payer: Self-pay

## 2018-04-11 ENCOUNTER — Other Ambulatory Visit: Payer: Self-pay

## 2018-04-11 ENCOUNTER — Encounter (HOSPITAL_COMMUNITY): Payer: Self-pay | Admitting: Emergency Medicine

## 2018-04-11 ENCOUNTER — Emergency Department (HOSPITAL_COMMUNITY)
Admission: EM | Admit: 2018-04-11 | Discharge: 2018-04-11 | Disposition: A | Discharge status: Home / Self Care | Payer: Medicaid Other | Attending: Emergency Medicine | Admitting: Emergency Medicine

## 2018-04-11 ENCOUNTER — Emergency Department (HOSPITAL_COMMUNITY): Payer: Medicaid Other

## 2018-04-11 DIAGNOSIS — Z79899 Other long term (current) drug therapy: Secondary | ICD-10-CM | POA: Insufficient documentation

## 2018-04-11 DIAGNOSIS — J45909 Unspecified asthma, uncomplicated: Secondary | ICD-10-CM | POA: Insufficient documentation

## 2018-04-11 DIAGNOSIS — I10 Essential (primary) hypertension: Secondary | ICD-10-CM | POA: Insufficient documentation

## 2018-04-11 DIAGNOSIS — M79671 Pain in right foot: Secondary | ICD-10-CM | POA: Insufficient documentation

## 2018-04-11 DIAGNOSIS — F17210 Nicotine dependence, cigarettes, uncomplicated: Secondary | ICD-10-CM | POA: Insufficient documentation
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
## 2018-04-11 NOTE — ED Notes (Signed)
Patient transported to X-ray 

## 2018-04-11 NOTE — Discharge Instructions (Addendum)
Please return to the Emergency Department for any new or worsening symptoms or if your symptoms do not improve. Please be sure to follow up with your Primary Care Physician as soon as possible regarding your visit today. If you do not have a Primary Doctor please use the resources below to establish one. Your imaging today did not show any fracture or dislocation in the foot.  It is still possible that there could be ligament or tendon injury to the foot.  Please use rest, ice and elevation to help with swelling and pain.  Please follow-up with the orthopedic surgeon's office as soon as possible for further evaluation. Narcotic medications cannot be prescribed today due to your current use of Suboxone.  Contact a health care provider if: Your pain does not get better after a few days of self-care. Your pain gets worse. You cannot stand on your foot. Get help right away if: Your foot is numb or tingling. Your foot or toes are swollen. Your foot or toes turn white or blue. You have warmth and redness along your foot.  Do not take your medicine if  develop an itchy rash, swelling in your mouth or lips, or difficulty breathing.   RESOURCE GUIDE  Chronic Pain Problems: Contact [facility]  [phone] Patients need to be referred by their primary care doctor.  Insufficient Money for Medicine: Contact [employer]:  call "211" or [place] [phone].  No Primary Care Doctor: Call [facility]  [phone] - can help you locate a primary care doctor that  accepts your insurance, provides certain services, etc. Physician Referral Service- [phone]  Agencies that provide inexpensive medical care: [facility]  [facility]  [phone] [facility]  [phone] [facility]  [phone] [facility]  [phone] [facility]  [phone]  [place] Providers: [facility]  Clinic- [address], [address]  [phone], Mon-Fri 9am-7pm, Sat 9am-1pm [place], [address] [place]  [place], [address] [place]  [place]- [address]  [place], [address], [phone]  Only accepts [place] Access [place] patients after they have their name  applied to their card  Self Pay (no insurance) in [facility]: Sickle Cell Patients: Dr Kevan Ny, [facility]  [place], [facility] [place]  [facility] [place]- [address], [facility]- see information above (Speak to [employer] if you do not have insurance)       -  [facility]- [place], [place],  [place]       -  [place], [place]  Dr Vista Lawman-  [address], [address], [place], [facility]- [address] [phone]       -  [facility]- [address], [place], also [address] [phone]       -  [place], [place], 1st & 3rd Saturday   every month, 10am-1pm  1) Find a Doctor and Pay Out of Pocket Although you won't have to find out who is covered by your insurance plan, it is a good idea to ask around and get recommendations. You will then need to call the office and see if the doctor you have chosen will accept you as a new patient and what types of options they offer for patients who are self-pay. Some doctors offer discounts or will set up payment plans for their patients who do not have insurance, but you will need to ask so you aren't surprised when you get to your appointment.  2) Contact Your Local Health  Department Not all health departments have doctors that can see patients for sick visits, but many do, so it is worth a call to see if yours does. If you don't know where your local health department is, you can check in your phone book. The CDC also has a tool to help you locate your state's health department, and many state websites also have listings of all of their local health departments.  3) Find a [place] [facility] If your illness is not likely to be very severe or complicated, you may want to try a walk in clinic. These are popping up all over the country in pharmacies, drugstores, and shopping centers. They're usually staffed by nurse practitioners or physician assistants that have been trained to treat common illnesses and complaints. They're usually fairly quick and inexpensive. However, if you have serious medical issues or chronic medical problems, these are probably not your best option  [facility] [place], [facility], [address] [place], phone [phone] or [phone].  Monday - Friday, call for an appointment. [place], [facility], [address], [place], phone [phone] or [phone].  Monday - Friday, call for an appointment.  Abuse/Neglect: [place] [phone] [place] [phone] (After Hours)  Emergency Shelter:  [facility] [phone]  Maternity Homes: Room at the [place] [phone] [place] [phone]  MRSA Hotline #:   (907)311-0021  [facility] [place] Dept. [address]         [place] Phone:  [phone]                                   Phone:  [phone]                   Phone:  [place], [phone]       -  [facility] in [place], [address]                                  [place] [phone] or [phone] (After Hours)   [place]  Substance Abuse Resources: Alcohol and Drug Services  [place] 2095654297 [facility] [place] Chinita P[place] [phone] Residential & Outpatient Substance Abuse Program  [phone]  Psychological Services: [place]  [phone] [place]  [place], [place] [address], [place], [place]: [phone] or [phone], [place]  Dental Assistance  If unable to pay or uninsured, contact:  [facility] or [facility]. to become qualified for the adult dental clinic.  Patients with Medicaid: [facility] [phone] [address], [place] [address] [phone]  If unable to pay, or uninsured, contact [facility] [phone]) or [place] [phone] in [place], [place] in [facility]) to become qualified for the adult dental clinic   Other [place]- [place], [place], [place], [address], [place], [place], 2nd and 4th Thursday of the month at 6:30am.  10 clients each day by appointment, can sometimes see walk-in patients if someone does not show for an appointment. [facility]- [address] [place], [place], [address], [place], [place], [place], [address], [place] Department- [phone] [place] [facility] [phone]
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
## 2018-10-24 ENCOUNTER — Emergency Department (HOSPITAL_COMMUNITY)
Admission: EM | Admit: 2018-10-24 | Discharge: 2018-10-24 | Disposition: A | Discharge status: Home / Self Care | Payer: Medicaid Other | Attending: Emergency Medicine | Admitting: Emergency Medicine

## 2018-10-24 ENCOUNTER — Encounter (HOSPITAL_COMMUNITY): Payer: Self-pay

## 2018-10-24 ENCOUNTER — Emergency Department (HOSPITAL_COMMUNITY): Payer: Medicaid Other

## 2018-10-24 ENCOUNTER — Other Ambulatory Visit: Payer: Self-pay

## 2018-10-24 DIAGNOSIS — J4520 Mild intermittent asthma, uncomplicated: Secondary | ICD-10-CM | POA: Insufficient documentation

## 2018-10-24 DIAGNOSIS — R1084 Generalized abdominal pain: Secondary | ICD-10-CM | POA: Diagnosis not present

## 2018-10-24 DIAGNOSIS — J441 Chronic obstructive pulmonary disease with (acute) exacerbation: Secondary | ICD-10-CM

## 2018-10-24 DIAGNOSIS — Z880 Allergy status to penicillin: Secondary | ICD-10-CM | POA: Diagnosis not present

## 2018-10-24 DIAGNOSIS — Z79899 Other long term (current) drug therapy: Secondary | ICD-10-CM | POA: Insufficient documentation

## 2018-10-24 DIAGNOSIS — F1410 Cocaine abuse, uncomplicated: Secondary | ICD-10-CM

## 2018-10-24 DIAGNOSIS — F17210 Nicotine dependence, cigarettes, uncomplicated: Secondary | ICD-10-CM | POA: Insufficient documentation

## 2018-10-24 DIAGNOSIS — F10920 Alcohol use, unspecified with intoxication, uncomplicated: Secondary | ICD-10-CM | POA: Diagnosis not present

## 2018-10-24 DIAGNOSIS — E860 Dehydration: Secondary | ICD-10-CM | POA: Diagnosis not present

## 2018-10-24 DIAGNOSIS — R112 Nausea with vomiting, unspecified: Secondary | ICD-10-CM | POA: Diagnosis present

## 2018-10-24 LAB — URINALYSIS, ROUTINE W REFLEX MICROSCOPIC
Bacteria, UA: NONE SEEN
Bilirubin Urine: NEGATIVE
Glucose, UA: NEGATIVE mg/dL
Ketones, ur: NEGATIVE mg/dL
Leukocytes,Ua: NEGATIVE
Nitrite: NEGATIVE
Protein, ur: NEGATIVE mg/dL
Specific Gravity, Urine: >1.046 — ABNORMAL HIGH (ref 1.005–1.030)
pH: 6 (ref 5.0–8.0)

## 2018-10-24 LAB — CBC WITH DIFFERENTIAL/PLATELET
Abs Immature Granulocytes: 0.03 10*3/uL (ref 0.00–0.07)
Basophils Absolute: 0.1 10*3/uL (ref 0.0–0.1)
Basophils Relative: 1 %
Eosinophils Absolute: 0.1 10*3/uL (ref 0.0–0.5)
Eosinophils Relative: 1 %
HCT: 46.7 % (ref 39.0–52.0)
Hemoglobin: 15 g/dL (ref 13.0–17.0)
Immature Granulocytes: 0 %
Lymphocytes Relative: 34 %
Lymphs Abs: 3.2 10*3/uL (ref 0.7–4.0)
MCH: 30.2 pg (ref 26.0–34.0)
MCHC: 32.1 g/dL (ref 30.0–36.0)
MCV: 94 fL (ref 80.0–100.0)
Monocytes Absolute: 0.5 10*3/uL (ref 0.1–1.0)
Monocytes Relative: 6 %
Neutro Abs: 5.4 10*3/uL (ref 1.7–7.7)
Neutrophils Relative %: 58 %
Platelets: 211 10*3/uL (ref 150–400)
RBC: 4.97 MIL/uL (ref 4.22–5.81)
RDW: 12.2 % (ref 11.5–15.5)
WBC: 9.3 10*3/uL (ref 4.0–10.5)
nRBC: 0 % (ref 0.0–0.2)

## 2018-10-24 LAB — RAPID URINE DRUG SCREEN, HOSP PERFORMED
Amphetamines: NOT DETECTED
Barbiturates: NOT DETECTED
Benzodiazepines: NOT DETECTED
Cocaine: POSITIVE — AB
Opiates: NOT DETECTED
Tetrahydrocannabinol: NOT DETECTED

## 2018-10-24 LAB — COMPREHENSIVE METABOLIC PANEL
ALT: 48 U/L — ABNORMAL HIGH (ref 0–44)
AST: 52 U/L — ABNORMAL HIGH (ref 15–41)
Albumin: 3.9 g/dL (ref 3.5–5.0)
Alkaline Phosphatase: 110 U/L (ref 38–126)
Anion gap: 14 (ref 5–15)
BUN: 9 mg/dL (ref 6–20)
CO2: 34 mmol/L — ABNORMAL HIGH (ref 22–32)
Calcium: 8.9 mg/dL (ref 8.9–10.3)
Chloride: 93 mmol/L — ABNORMAL LOW (ref 98–111)
Creatinine, Ser: 0.58 mg/dL — ABNORMAL LOW (ref 0.61–1.24)
GFR calc Af Amer: >60 mL/min (ref >60)
GFR calc non Af Amer: >60 mL/min (ref >60)
Glucose, Bld: 142 mg/dL — ABNORMAL HIGH (ref 70–99)
Potassium: 3.7 mmol/L (ref 3.5–5.1)
Sodium: 141 mmol/L (ref 135–145)
Total Bilirubin: 0.5 mg/dL (ref 0.3–1.2)
Total Protein: 7.3 g/dL (ref 6.5–8.1)

## 2018-10-24 LAB — ETHANOL: Alcohol, Ethyl (B): 370 mg/dL (ref <10)

## 2018-10-24 LAB — LIPASE, BLOOD: Lipase: 51 U/L (ref 11–51)

## 2018-10-24 LAB — TROPONIN I: Troponin I: <0.03 ng/mL (ref <0.03)

## 2018-10-24 MED ORDER — MORPHINE SULFATE (PF) 4 MG/ML IV SOLN
4.0000 mg | Freq: Once | INTRAVENOUS | Status: DC
Start: 2018-10-24 — End: 2018-10-24
  Filled 2018-10-24: qty 1

## 2018-10-24 MED ORDER — ALBUTEROL SULFATE HFA 108 (90 BASE) MCG/ACT IN AERS
4.0000 | INHALATION_SPRAY | Freq: Once | RESPIRATORY_TRACT | Status: AC
  Administered 2018-10-24: 17:00:00 4 via RESPIRATORY_TRACT
  Filled 2018-10-24: qty 6.7

## 2018-10-24 MED ORDER — SODIUM CHLORIDE 0.9 % IV BOLUS
1000.0000 mL | Freq: Once | INTRAVENOUS | Status: AC
  Administered 2018-10-24: 1000 mL via INTRAVENOUS

## 2018-10-24 MED ORDER — ONDANSETRON HCL 4 MG/2ML IJ SOLN
4.0000 mg | Freq: Once | INTRAMUSCULAR | Status: AC
  Administered 2018-10-24: 17:00:00 4 mg via INTRAVENOUS
  Filled 2018-10-24: qty 2

## 2018-10-24 MED ORDER — IOHEXOL 300 MG/ML  SOLN
100.0000 mL | Freq: Once | INTRAMUSCULAR | Status: AC | PRN
  Administered 2018-10-24: 100 mL via INTRAVENOUS

## 2018-10-24 MED ORDER — PROMETHAZINE HCL 25 MG RE SUPP: 25.0000 mg | Freq: Four times a day (QID) | RECTAL | 0 refills | Status: AC | PRN

## 2018-10-24 MED ORDER — METHYLPREDNISOLONE SODIUM SUCC 125 MG IJ SOLR
125.0000 mg | Freq: Once | INTRAMUSCULAR | Status: AC
  Administered 2018-10-24: 17:00:00 125 mg via INTRAVENOUS
  Filled 2018-10-24: qty 2

## 2018-10-24 MED ORDER — PREDNISONE 10 MG (21) PO TBPK: ORAL_TABLET | ORAL | 0 refills | Status: AC

## 2018-10-24 NOTE — ED Notes (Signed)
Pt [place] woul like an update when possible [phone]

## 2018-10-24 NOTE — ED Notes (Signed)
[facility]tar called for pt 

## 2018-10-24 NOTE — Discharge Instructions (Addendum)
Stop drinking and using cocaine.

## 2018-10-24 NOTE — ED Notes (Signed)
Lab called this Rn due to ethanol level of 370

## 2018-10-24 NOTE — ED Provider Notes (Signed)
[facility] EMERGENCY DEPARTMENT Provider Note   CSN: [id number] Arrival date & time: 10/24/18  1537    History   Chief Complaint Chief Complaint  Patient presents with   Emesis    HPI [name] Jagiello Sofia Jaquith. is a 38 y.o. male.     Pt presents to the ED today with n/v and sob.  The pt has a hx of alpha-1 antitrypsin deficiency and Crohn's disease.  He has tried phenergan and zofran without improvement in n/v.  No diarrhea.  He hurts all over.  No fever.  EMS did not give pt anything en route.  He is on chronic 2L oxygen via [place].     Past Medical History:  Diagnosis Date   Alpha-1-antitrypsin deficiency ([place])    Anxiety    Asthma    mild    Bipolar 1 disorder (HCC)    Self laceration and self harm behaviors    COPD (chronic obstructive pulmonary disease) ([place])    Crohn disease ([place]) Dx 2004   terminal ileum Crohn's s/p ileocolectomy with anastamosis   Depression    Kidney stones    Substance abuse [facility])     Patient Active Problem List   Diagnosis Date Noted   COPD with acute exacerbation ([place]) 08/19/2017   Bandemia    Lobar pneumonia ([place]) 06/04/2017   Sepsis, unspecified organism ([place]) 06/04/2017   Respiratory failure with hypoxia ([place]) 06/04/2017   Essential hypertension 06/04/2017   Neuropathy 02/27/2017   COPD with AB component  08/08/2016   Alpha-1-antitrypsin deficiency ([place]) 08/03/2016   Diarrhea    Crohn's disease with complication ([place]) 79/48/0165   Depression 02/27/2016   Substance abuse ([place]) 02/27/2016   Influenza with pneumonia 10/07/2014   Protein-calorie malnutrition, severe ([place]) 10/06/2014   Sepsis ([place]) 10/05/2014   CAP (community acquired pneumonia) 10/05/2014   Asthma 10/05/2014   Generalized abdominal pain    Hematemesis 09/04/2011   ANEMIA 09/20/2009   BIPOLAR DISORDER UNSPECIFIED 09/20/2009   ADHD 09/20/2009   Regional enteritis ([place]) 09/20/2009   INTESTINAL OBSTRUCTION, HX OF  09/20/2009   COCAINE ABUSE, HX OF 09/20/2009    Past Surgical History:  Procedure Laterality Date   COLON SURGERY  01/17/2006   ESOPHAGOGASTRODUODENOSCOPY  09/04/2011   Procedure: ESOPHAGOGASTRODUODENOSCOPY (EGD);  Surgeon: Estanislado Emms., MD,FACG;  Location: [facility];  Service: Endoscopy;  Laterality: N/A;   INGUINAL HERNIA REPAIR     On the right and the left    TONSILLECTOMY     20 yrs ago        Home Medications    Prior to Admission medications   Medication Sig Start Date End Date Taking? Authorizing Provider  albuterol (PROAIR HFA) 108 (90 Base) MCG/ACT inhaler INHALE 2 PUFFS BY MOUTH EVERY 6 HOURS AS NEEDED FOR WHEEZING AND SHORTNESS OF BREATH 08/23/17   Hongalgi, Lenis Dickinson, MD  albuterol (PROVENTIL) (2.5 MG/3ML) 0.083% nebulizer solution Take 3 mLs (2.5 mg total) by nebulization every 6 (six) hours as needed for wheezing or shortness of breath. 08/21/17   Reyne Dumas, MD  budesonide-formoterol (SYMBICORT) 160-4.5 MCG/ACT inhaler Inhale 2 puffs into the lungs 2 (two) times daily. 08/21/17   Reyne Dumas, MD  clonazePAM (KLONOPIN) 0.5 MG tablet Take 0.5 mg 3 (three) times daily by mouth.     [provider]  dextromethorphan-guaiFENesin (MUCINEX DM) 30-600 MG 12hr tablet Take 2 tablets by mouth 2 (two) times daily as needed for cough. 08/21/17   Reyne Dumas, MD  diltiazem [facility])  300 MG 24 hr capsule Take 300 mg by mouth daily.    [provider]  etodolac (LODINE) 400 MG tablet Take 1 tablet (400 mg total) by mouth 2 (two) times daily as needed for moderate pain. 09/08/17   Orpah Greek, MD  gabapentin (NEURONTIN) 600 MG tablet Take 1 tablet (600 mg total) 3 (three) times daily by mouth. 06/08/17   Lavina Hamman, MD  hydrOXYzine (ATARAX/VISTARIL) 25 MG tablet Take 1 tablet (25 mg total) by mouth every 6 (six) hours as needed for anxiety. 03/02/17   Fatima Blank, MD  ibuprofen (ADVIL,MOTRIN) 200 MG tablet Take 800 mg by mouth  every 6 (six) hours as needed for moderate pain.    [provider]  nicotine (NICODERM CQ - DOSED IN MG/24 HOURS) 14 mg/24hr patch Place 1 patch (14 mg total) onto the skin daily. 08/24/17   Hongalgi, Lenis Dickinson, MD  predniSONE (STERAPRED UNI-PAK 21 TAB) 10 MG (21) TBPK tablet Take 6 tabs for 2 days, then 5 for 2 days, then 4 for 2 days, then 3 for 2 days, 2 for 2 days, then 1 for 2 days 10/24/18   Isla Pence, MD  promethazine (PHENERGAN) 25 MG suppository Place 1 suppository (25 mg total) rectally every 6 (six) hours as needed for nausea or vomiting. 10/24/18   Isla Pence, MD  promethazine (PHENERGAN) 25 MG tablet Take 25 mg daily as needed by mouth. 04/24/17   [provider]  tiotropium (SPIRIVA HANDIHALER) 18 MCG inhalation capsule Place 1 capsule (18 mcg total) into inhaler and inhale daily. 08/23/17 09/22/17  Modena Jansky, MD    Family History Family History  Problem Relation Age of Onset   Hypertension Other    Diabetes Other    COPD Maternal Grandfather     Social History Social History   Tobacco Use   Smoking status: Current Every Day Smoker    Packs/day: 0.50    Years: 25.00    Pack years: 12.50    Types: Cigarettes   Smokeless tobacco: Never Used  Substance Use Topics   Alcohol use: Yes    Comment: occsionally   Drug use: Yes    Frequency: 2.0 times per week    Types: Marijuana, Heroin    Comment: occasionally.      Allergies   Penicillins; Yellow dyes (non-tartrazine); Diphenhydramine hcl; and Red dye   Review of Systems Review of Systems  Respiratory: Positive for cough, shortness of breath and wheezing.   Gastrointestinal: Positive for nausea and vomiting.  All other systems reviewed and are negative.    Physical Exam Updated Vital Signs BP 115/87    Pulse (!) 115    Temp 98.7 F (37.1 C) (Oral)    Resp 16    SpO2 94%   Physical Exam Vitals signs and nursing note reviewed.  Constitutional:      Appearance: He is  cachectic.  HENT:     Head: Normocephalic and atraumatic.     Right Ear: External ear normal.     Left Ear: External ear normal.     Nose: Nose normal.     Mouth/Throat:     Mouth: Mucous membranes are dry.  Eyes:     Extraocular Movements: Extraocular movements intact.     Conjunctiva/sclera: Conjunctivae normal.     Pupils: Pupils are equal, round, and reactive to light.  Neck:     Musculoskeletal: Normal range of motion and neck supple.  Cardiovascular:  Rate and Rhythm: Regular rhythm. Tachycardia present.     Pulses: Normal pulses.     Heart sounds: Normal heart sounds.  Pulmonary:     Effort: Tachypnea and accessory muscle usage present.     Breath sounds: Wheezing present.  Abdominal:     General: Abdomen is flat. Bowel sounds are normal.     Palpations: Abdomen is soft.     Tenderness: There is generalized abdominal tenderness.  Musculoskeletal: Normal range of motion.  Skin:    General: Skin is warm.     Capillary Refill: Capillary refill takes less than 2 seconds.  Neurological:     General: No focal deficit present.     Mental Status: He is alert and oriented to person, place, and time.  Psychiatric:        Mood and Affect: Mood normal.        Behavior: Behavior normal.      ED Treatments / Results  Labs (all labs ordered are listed, but only abnormal results are displayed) Labs Reviewed  COMPREHENSIVE METABOLIC PANEL - Abnormal; Notable for the following components:      Result Value   Chloride 93 (*)    CO2 34 (*)    Glucose, Bld 142 (*)    Creatinine, Ser 0.58 (*)    AST 52 (*)    ALT 48 (*)    All other components within normal limits  URINALYSIS, ROUTINE W REFLEX MICROSCOPIC - Abnormal; Notable for the following components:   Specific Gravity, Urine >1.046 (*)    Hgb urine dipstick SMALL (*)    All other components within normal limits  ETHANOL - Abnormal; Notable for the following components:   Alcohol, Ethyl (B) 370 (*)    All other  components within normal limits  RAPID URINE DRUG SCREEN, HOSP PERFORMED - Abnormal; Notable for the following components:   Cocaine POSITIVE (*)    All other components within normal limits  CBC WITH DIFFERENTIAL/PLATELET  LIPASE, BLOOD  TROPONIN I    EKG EKG Interpretation  Date/Time:  Thursday October 24 2018 16:26:11 EDT Ventricular Rate:  113 PR Interval:    QRS Duration: 93 QT Interval:  337 QTC Calculation: 462 R Axis:   78 Text Interpretation:  Sinus tachycardia Biatrial enlargement RSR' in V1 or V2, right VCD or RVH ST elev, probable normal early repol pattern No significant change since last tracing Confirmed by Isla Pence [phone]) on 10/24/2018 4:27:56 PM   Radiology Ct Abdomen Pelvis W Contrast  Result Date: 10/24/2018 CLINICAL DATA:  Abdominal pain with nausea vomiting for the past week. EXAM: CT ABDOMEN AND PELVIS WITH CONTRAST TECHNIQUE: Multidetector CT imaging of the abdomen and pelvis was performed using the standard protocol following bolus administration of intravenous contrast. CONTRAST:  129m OMNIPAQUE IOHEXOL 300 MG/ML  SOLN COMPARISON:  CT abdomen pelvis dated September 06, 2016. FINDINGS: Lower chest: Extensive emphysematous changes are again noted. Scarring/atelectasis in the lingula. Hepatobiliary: Small amount of focal fat along the falciform ligament. No other focal liver abnormality is seen. No gallstones, gallbladder wall thickening, or biliary dilatation. Pancreas: Unremarkable. No pancreatic ductal dilatation or surrounding inflammatory changes. Spleen: Normal in size without focal abnormality. Adrenals/Urinary Tract: Adrenal glands are unremarkable. Slight interval enlargement of the now 1.5 cm simple cyst in the upper pole the right kidney. Other subcentimeter low-density lesions in both kidneys are too small to characterize, but likely represent cysts as well. There are punctate bilateral renal calculi. No hydronephrosis. The bladder is unremarkable.  Stomach/Bowel: The stomach is within normal limits. There are a few prominent loops of jejunum in the left abdomen. No obstruction. No bowel wall thickening or surrounding inflammatory changes. Prior ileocecectomy. Vascular/Lymphatic: No significant vascular findings are present. No enlarged abdominal or pelvic lymph nodes. Reproductive: Prostate is unremarkable. Other: No abdominal wall hernia or abnormality. No abdominopelvic ascites. No pneumoperitoneum. Musculoskeletal: No acute or significant osseous findings. IMPRESSION: 1.  No acute intra-abdominal process. 2. Bilateral nonobstructive nephrolithiasis. 3. Unchanged extensive emphysema (ICD10-J43.9) at the lung bases, consistent with history of alpha-1-antitrypsin deficiency. Electronically Signed   By: Titus Dubin M.D.   On: 10/24/2018 18:05   Dg Chest Portable 1 View  Result Date: 10/24/2018 CLINICAL DATA:  Shortness of breath. EXAM: PORTABLE CHEST 1 VIEW COMPARISON:  Radiographs of September 08, 2017. FINDINGS: The heart size and mediastinal contours are within normal limits. Both lungs are clear. Hyperexpansion of the lungs is noted. No pneumothorax or pleural effusion is noted. The visualized skeletal structures are unremarkable. IMPRESSION: No acute cardiopulmonary abnormality seen. Hyperinflation of the lungs is noted. Electronically Signed   By: Marijo Conception, M.D.   On: 10/24/2018 16:31    Procedures Procedures (including critical care time)  Medications Ordered in ED Medications  sodium chloride 0.9 % bolus 1,000 mL (0 mLs Intravenous Stopped 10/24/18 1839)  ondansetron (ZOFRAN) injection 4 mg (4 mg Intravenous Given 10/24/18 1633)  albuterol (PROVENTIL HFA;VENTOLIN HFA) 108 (90 Base) MCG/ACT inhaler 4 puff (4 puffs Inhalation Given 10/24/18 1633)  methylPREDNISolone sodium succinate (SOLU-MEDROL) 125 mg/2 mL injection 125 mg (125 mg Intravenous Given 10/24/18 1633)  iohexol (OMNIPAQUE) 300 MG/ML solution 100 mL (100 mLs Intravenous  Contrast Given 10/24/18 1736)     Initial Impression / Assessment and Plan / ED Course  I have reviewed the triage vital signs and the nursing notes.  Pertinent labs & imaging results that were available during my care of the patient were reviewed by me and considered in my medical decision making (see chart for details).        Pt is feeling much better.  He does not have a sbo on CT abd/pelvis.  Alcohol level is high and he is + for cocaine.  Pt's O2 sat in the upper 90s on his usual oxygen.   Pt will be d/c home with phenergan supp for the n/v and a prednisone taper for the COPD.  He is also d/c with resource guides in case he wants detox.  Return if worse.  Final Clinical Impressions(s) / ED Diagnoses   Final diagnoses:  COPD with acute exacerbation ([place])  Dehydration  Alcoholic intoxication without complication ([place])  Cocaine abuse ([place])  Generalized abdominal pain    ED Discharge Orders         Ordered    predniSONE (STERAPRED UNI-PAK 21 TAB) 10 MG (21) TBPK tablet     10/24/18 1951    promethazine (PHENERGAN) 25 MG suppository  Every 6 hours PRN     10/24/18 1952           Isla Pence, MD 10/24/18 2000

## 2018-10-24 NOTE — ED Notes (Signed)
[name] [phone] pts mother please call asap about pts transportation

## 2018-10-24 NOTE — ED Triage Notes (Signed)
Pt endorses n/v x 1 week and generalized pain, wheezing noted, has hx of copd. Pt is immunosuppressed. BP 130/70, HR 115, RR 20, spo2 98% on 3L [place] which pt wears at all times at home.

## 2018-11-05 ENCOUNTER — Emergency Department (HOSPITAL_COMMUNITY): Payer: Medicaid Other

## 2018-11-05 ENCOUNTER — Emergency Department (HOSPITAL_COMMUNITY)
Admission: EM | Admit: 2018-11-05 | Discharge: 2018-11-06 | Disposition: A | Discharge status: Home / Self Care | Payer: Medicaid Other | Attending: Emergency Medicine | Admitting: Emergency Medicine

## 2018-11-05 ENCOUNTER — Other Ambulatory Visit: Payer: Self-pay

## 2018-11-05 ENCOUNTER — Encounter (HOSPITAL_COMMUNITY): Payer: Self-pay | Admitting: Emergency Medicine

## 2018-11-05 DIAGNOSIS — F17210 Nicotine dependence, cigarettes, uncomplicated: Secondary | ICD-10-CM | POA: Diagnosis not present

## 2018-11-05 DIAGNOSIS — T401X1A Poisoning by heroin, accidental (unintentional), initial encounter: Secondary | ICD-10-CM | POA: Insufficient documentation

## 2018-11-05 DIAGNOSIS — Z79899 Other long term (current) drug therapy: Secondary | ICD-10-CM | POA: Insufficient documentation

## 2018-11-05 DIAGNOSIS — J449 Chronic obstructive pulmonary disease, unspecified: Secondary | ICD-10-CM | POA: Insufficient documentation

## 2018-11-05 MED ORDER — NALOXONE HCL 2 MG/2ML IJ SOSY
PREFILLED_SYRINGE | INTRAMUSCULAR | Status: AC
  Administered 2018-11-05: 22:00:00
  Filled 2018-11-05: qty 2

## 2018-11-05 MED ORDER — NALOXONE HCL 0.4 MG/ML IJ SOLN: 0.4000 mg | Freq: Once | INTRAMUSCULAR | Status: DC

## 2018-11-05 MED ORDER — NALOXONE HCL 4 MG/0.1ML NA LIQD: NASAL | 0 refills | Status: AC

## 2018-11-05 NOTE — ED Provider Notes (Addendum)
[facility] EMERGENCY DEPARTMENT Provider Note   CSN: [id number] Arrival date & time: 11/05/18  2034    History   Chief Complaint Chief Complaint  Patient presents with   Drug Overdose    HPI [name]. is a 38 y.o. male.     38 year old male with prior medical history as detailed below presents for evaluation following reported overdose.  Patient lives with his mother.  Patient reportedly told his mother that he was going to go snort some heroin.  He then proceeded to do so.  His mother then found him unresponsive.  EMS reports that he was unresponsive upon their initial evaluation with minimal respiratory attempts.  4 mg of intranasal Narcan was administered by EMS.  Upon arrival to the ED the patient was breathing on his own.  He was mildly agitated.  He denied other complaint.  Patient reports that his last use of heroin was approximately 4 years ago.  Use tonight was recreational in nature.  The history is provided by the patient, medical records and the EMS personnel.  Drug Overdose  This is a new problem. The current episode started less than 1 hour ago. The problem occurs rarely. The problem has been gradually improving. Pertinent negatives include no chest pain, no abdominal pain, no headaches and no shortness of breath. Nothing aggravates the symptoms. Nothing relieves the symptoms.    Past Medical History:  Diagnosis Date   Alpha-1-antitrypsin deficiency ([place])    Anxiety    Asthma    mild    Bipolar 1 disorder (HCC)    Self laceration and self harm behaviors    COPD (chronic obstructive pulmonary disease) ([place])    Crohn disease ([place]) Dx 2004   terminal ileum Crohn's s/p ileocolectomy with anastamosis   Depression    Kidney stones    Substance abuse [facility])     Patient Active Problem List   Diagnosis Date Noted   COPD with acute exacerbation ([place]) 08/19/2017   Bandemia    Lobar pneumonia ([place]) 06/04/2017    Sepsis, unspecified organism ([place]) 06/04/2017   Respiratory failure with hypoxia ([place]) 06/04/2017   Essential hypertension 06/04/2017   Neuropathy 02/27/2017   COPD with AB component  08/08/2016   Alpha-1-antitrypsin deficiency ([place]) 08/03/2016   Diarrhea    Crohn's disease with complication ([place]) 25/95/6387   Depression 02/27/2016   Substance abuse ([place]) 02/27/2016   Influenza with pneumonia 10/07/2014   Protein-calorie malnutrition, severe ([place]) 10/06/2014   Sepsis ([place]) 10/05/2014   CAP (community acquired pneumonia) 10/05/2014   Asthma 10/05/2014   Generalized abdominal pain    Hematemesis 09/04/2011   ANEMIA 09/20/2009   BIPOLAR DISORDER UNSPECIFIED 09/20/2009   ADHD 09/20/2009   Regional enteritis ([place]) 09/20/2009   INTESTINAL OBSTRUCTION, HX OF 09/20/2009   COCAINE ABUSE, HX OF 09/20/2009    Past Surgical History:  Procedure Laterality Date   COLON SURGERY  01/17/2006   ESOPHAGOGASTRODUODENOSCOPY  09/04/2011   Procedure: ESOPHAGOGASTRODUODENOSCOPY (EGD);  Surgeon: Estanislado Emms., MD,FACG;  Location: [facility];  Service: Endoscopy;  Laterality: N/A;   INGUINAL HERNIA REPAIR     On the right and the left    TONSILLECTOMY     20 yrs ago        Home Medications    Prior to Admission medications   Medication Sig Start Date End Date Taking? Authorizing Provider  albuterol (PROAIR HFA) 108 (90 Base) MCG/ACT inhaler INHALE 2 PUFFS BY MOUTH EVERY 6 HOURS AS  NEEDED FOR WHEEZING AND SHORTNESS OF BREATH 08/23/17   Hongalgi, Lenis Dickinson, MD  albuterol (PROVENTIL) (2.5 MG/3ML) 0.083% nebulizer solution Take 3 mLs (2.5 mg total) by nebulization every 6 (six) hours as needed for wheezing or shortness of breath. 08/21/17   Reyne Dumas, MD  budesonide-formoterol (SYMBICORT) 160-4.5 MCG/ACT inhaler Inhale 2 puffs into the lungs 2 (two) times daily. 08/21/17   Reyne Dumas, MD  clonazePAM (KLONOPIN) 0.5 MG tablet Take 0.5 mg 3 (three) times daily by mouth.      [provider]  dextromethorphan-guaiFENesin (MUCINEX DM) 30-600 MG 12hr tablet Take 2 tablets by mouth 2 (two) times daily as needed for cough. 08/21/17   Reyne Dumas, MD  diltiazem (TIAZAC) 300 MG 24 hr capsule Take 300 mg by mouth daily.    [provider]  etodolac (LODINE) 400 MG tablet Take 1 tablet (400 mg total) by mouth 2 (two) times daily as needed for moderate pain. 09/08/17   Orpah Greek, MD  gabapentin (NEURONTIN) 600 MG tablet Take 1 tablet (600 mg total) 3 (three) times daily by mouth. 06/08/17   Lavina Hamman, MD  hydrOXYzine (ATARAX/VISTARIL) 25 MG tablet Take 1 tablet (25 mg total) by mouth every 6 (six) hours as needed for anxiety. 03/02/17   Fatima Blank, MD  ibuprofen (ADVIL,MOTRIN) 200 MG tablet Take 800 mg by mouth every 6 (six) hours as needed for moderate pain.    [provider]  nicotine (NICODERM CQ - DOSED IN MG/24 HOURS) 14 mg/24hr patch Place 1 patch (14 mg total) onto the skin daily. 08/24/17   Hongalgi, Lenis Dickinson, MD  predniSONE (STERAPRED UNI-PAK 21 TAB) 10 MG (21) TBPK tablet Take 6 tabs for 2 days, then 5 for 2 days, then 4 for 2 days, then 3 for 2 days, 2 for 2 days, then 1 for 2 days 10/24/18   Isla Pence, MD  promethazine (PHENERGAN) 25 MG suppository Place 1 suppository (25 mg total) rectally every 6 (six) hours as needed for nausea or vomiting. 10/24/18   Isla Pence, MD  promethazine (PHENERGAN) 25 MG tablet Take 25 mg daily as needed by mouth. 04/24/17   [provider]  tiotropium (SPIRIVA HANDIHALER) 18 MCG inhalation capsule Place 1 capsule (18 mcg total) into inhaler and inhale daily. 08/23/17 09/22/17  Modena Jansky, MD    Family History Family History  Problem Relation Age of Onset   Hypertension Other    Diabetes Other    COPD Maternal Grandfather     Social History Social History   Tobacco Use   Smoking status: Current Every Day Smoker    Packs/day: 0.50    Years: 25.00      Pack years: 12.50    Types: Cigarettes   Smokeless tobacco: Never Used  Substance Use Topics   Alcohol use: Yes    Comment: occsionally   Drug use: Yes    Frequency: 2.0 times per week    Types: Marijuana, Heroin    Comment: occasionally.      Allergies   Penicillins; Yellow dyes (non-tartrazine); Diphenhydramine hcl; and Red dye   Review of Systems Review of Systems  Respiratory: Negative for shortness of breath.   Cardiovascular: Negative for chest pain.  Gastrointestinal: Negative for abdominal pain.  Neurological: Negative for headaches.  All other systems reviewed and are negative.    Physical Exam Updated Vital Signs BP 120/85    Pulse (!) 105    SpO2 96%   Physical Exam Vitals  signs and nursing note reviewed.  Constitutional:      General: He is not in acute distress.    Appearance: Normal appearance. He is well-developed.  HENT:     Head: Normocephalic and atraumatic.  Eyes:     Conjunctiva/sclera: Conjunctivae normal.     Pupils: Pupils are equal, round, and reactive to light.  Neck:     Musculoskeletal: Normal range of motion and neck supple.  Cardiovascular:     Rate and Rhythm: Normal rate and regular rhythm.     Heart sounds: Normal heart sounds.  Pulmonary:     Effort: Pulmonary effort is normal. No respiratory distress.     Breath sounds: Normal breath sounds.  Abdominal:     General: There is no distension.     Palpations: Abdomen is soft.     Tenderness: There is no abdominal tenderness.  Musculoskeletal: Normal range of motion.        General: No deformity.  Skin:    General: Skin is warm and dry.  Neurological:     Mental Status: He is alert and oriented to person, place, and time. Mental status is at baseline.      ED Treatments / Results  Labs (all labs ordered are listed, but only abnormal results are displayed) Labs Reviewed - No data to display  EKG None  Radiology Dg Chest [facility] 1 View  Result Date:  11/05/2018 CLINICAL DATA:  Found unresponsive, history of COPD EXAM: PORTABLE CHEST 1 VIEW COMPARISON:  10/24/2018 FINDINGS: Cardiac shadows within normal limits. The lungs are hyperinflated consistent with COPD. This is stable from the prior exam. No focal infiltrate or effusion is seen. No acute bony abnormality is noted. IMPRESSION: COPD without acute abnormality. Electronically Signed   By: Inez Catalina M.D.   On: 11/05/2018 21:18    Procedures Procedures (including critical care time)  Medications Ordered in ED Medications  naloxone (NARCAN) injection 0.4 mg (has no administration in time range)  naloxone [facility]) 2 MG/2ML injection (  Given 11/05/18 2221)     Initial Impression / Assessment and Plan / ED Course  I have reviewed the triage vital signs and the nursing notes.  Pertinent labs & imaging results that were available during my care of the patient were reviewed by me and considered in my medical decision making (see chart for details).        MDM  Screen complete  Patient is presenting following reported heroin overdose.  Patient was given 4 mg of intranasal Narcan in the field.  Patient upon arrival to the ED was improved.  He was observed.  Proximately 1-1/2 hours following his arrival he was noted to be more somnolent and less responsive.  Additional 1 mg of intravenous Narcan was administered.  Patient is currently improved.  He is mentating well.  He does have a chronic O2 requirement and usually wears 2-1/2 to 3 L.  With this level of nasal cannula O2 he is comfortable.  He desires discharge.  He reports that he will need [facility] transport to get home.    Final Clinical Impressions(s) / ED Diagnoses   Final diagnoses:  Accidental overdose of heroin, initial encounter [facility])    ED Discharge Orders    None       Valarie Merino, MD 11/05/18 2253    Valarie Merino, MD 11/05/18 2330

## 2018-11-05 NOTE — Discharge Instructions (Signed)
Return for any problem.  Follow-up with your regular care provider as instructed.  Avoid further use of heroin.  Heroin use may result in your death.

## 2018-11-05 NOTE — ED Notes (Signed)
Xray in pt's room

## 2018-11-05 NOTE — ED Triage Notes (Signed)
Pt BIB [facility] from home. Pt reportedly told his mom that he was going to use heroin, mom found pt unresponsive in his room. Given 61m narcan IN PTA.

## 2018-11-06 NOTE — ED Notes (Signed)
Patient verbalizes understanding of discharge instructions. Opportunity for questioning and answers were provided. Armband removed by staff, pt discharged from ED by [facility]. Discharge papers reviewed with mother and patient

## 2019-01-22 DEATH — deceased
